# Patient Record
Sex: Female | Born: 1945
Health system: Southern US, Community
[De-identification: ages and names within clinical notes are randomized; demographics above are authoritative.]

## PROBLEM LIST (undated history)

## (undated) DIAGNOSIS — B009 Herpesviral infection, unspecified: Secondary | ICD-10-CM

## (undated) DIAGNOSIS — K219 Gastro-esophageal reflux disease without esophagitis: Secondary | ICD-10-CM

## (undated) DIAGNOSIS — E7800 Pure hypercholesterolemia, unspecified: Secondary | ICD-10-CM

## (undated) DIAGNOSIS — Z8669 Personal history of other diseases of the nervous system and sense organs: Secondary | ICD-10-CM

## (undated) DIAGNOSIS — C50919 Malignant neoplasm of unspecified site of unspecified female breast: Secondary | ICD-10-CM

## (undated) DIAGNOSIS — M419 Scoliosis, unspecified: Secondary | ICD-10-CM

## (undated) DIAGNOSIS — H9319 Tinnitus, unspecified ear: Secondary | ICD-10-CM

## (undated) DIAGNOSIS — R768 Other specified abnormal immunological findings in serum: Secondary | ICD-10-CM

## (undated) DIAGNOSIS — Z1509 Genetic susceptibility to other malignant neoplasm: Secondary | ICD-10-CM

## (undated) DIAGNOSIS — Z1501 Genetic susceptibility to malignant neoplasm of breast: Secondary | ICD-10-CM

## (undated) DIAGNOSIS — S93409A Sprain of unspecified ligament of unspecified ankle, initial encounter: Secondary | ICD-10-CM

## (undated) DIAGNOSIS — R7689 Other specified abnormal immunological findings in serum: Secondary | ICD-10-CM

## (undated) HISTORY — DX: Herpesviral infection, unspecified: B00.9

## (undated) HISTORY — DX: Scoliosis, unspecified: M41.9

## (undated) HISTORY — DX: Genetic susceptibility to malignant neoplasm of breast: Z15.01

## (undated) HISTORY — DX: Pure hypercholesterolemia, unspecified: E78.00

## (undated) HISTORY — DX: Genetic susceptibility to malignant neoplasm of breast: Z15.09

## (undated) HISTORY — DX: Malignant neoplasm of unspecified site of unspecified female breast: C50.919

## (undated) HISTORY — DX: Personal history of other diseases of the nervous system and sense organs: Z86.69

## (undated) HISTORY — DX: Tinnitus, unspecified ear: H93.19

## (undated) HISTORY — DX: Other specified abnormal immunological findings in serum: R76.8

## (undated) HISTORY — DX: Gastro-esophageal reflux disease without esophagitis: K21.9

## (undated) HISTORY — PX: BREAST LUMPECTOMY: SHX2

## (undated) HISTORY — DX: Other specified abnormal immunological findings in serum: R76.89

---
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
## 1998-12-21 ENCOUNTER — Encounter: Payer: Self-pay | Admitting: Obstetrics and Gynecology

## 1998-12-21 ENCOUNTER — Ambulatory Visit (HOSPITAL_COMMUNITY): Admission: RE | Admit: 1998-12-21 | Discharge: 1998-12-21 | Payer: Self-pay | Admitting: *Deleted

## 1998-12-26 ENCOUNTER — Ambulatory Visit (HOSPITAL_COMMUNITY): Admission: RE | Admit: 1998-12-26 | Discharge: 1998-12-26 | Payer: Self-pay | Admitting: *Deleted

## 1998-12-26 ENCOUNTER — Encounter: Payer: Self-pay | Admitting: Obstetrics and Gynecology

## 1998-12-31 ENCOUNTER — Encounter: Admission: RE | Admit: 1998-12-31 | Discharge: 1999-03-31 | Payer: Self-pay | Admitting: Radiation Oncology

## 1999-01-01 ENCOUNTER — Ambulatory Visit (HOSPITAL_COMMUNITY): Admission: RE | Admit: 1999-01-01 | Discharge: 1999-01-01 | Payer: Self-pay

## 1999-01-01 ENCOUNTER — Encounter: Payer: Self-pay | Admitting: General Surgery

## 1999-01-04 ENCOUNTER — Encounter: Payer: Self-pay | Admitting: General Surgery

## 1999-01-09 ENCOUNTER — Encounter: Payer: Self-pay | Admitting: General Surgery

## 1999-01-09 ENCOUNTER — Ambulatory Visit (HOSPITAL_COMMUNITY): Admission: RE | Admit: 1999-01-09 | Discharge: 1999-01-09 | Payer: Self-pay | Admitting: General Surgery

## 1999-01-30 ENCOUNTER — Other Ambulatory Visit: Admission: RE | Admit: 1999-01-30 | Discharge: 1999-01-30 | Payer: Self-pay | Admitting: Obstetrics and Gynecology

## 1999-02-08 ENCOUNTER — Ambulatory Visit (HOSPITAL_COMMUNITY): Admission: RE | Admit: 1999-02-08 | Discharge: 1999-02-08 | Payer: Self-pay | Admitting: Oncology

## 1999-02-08 ENCOUNTER — Encounter: Payer: Self-pay | Admitting: Oncology

## 1999-02-14 ENCOUNTER — Ambulatory Visit (HOSPITAL_COMMUNITY): Admission: RE | Admit: 1999-02-14 | Discharge: 1999-02-14 | Payer: Self-pay | Admitting: Oncology

## 1999-05-14 ENCOUNTER — Encounter: Admission: RE | Admit: 1999-05-14 | Discharge: 1999-08-12 | Payer: Self-pay | Admitting: Radiation Oncology

## 1999-06-26 ENCOUNTER — Ambulatory Visit (HOSPITAL_COMMUNITY): Admission: RE | Admit: 1999-06-26 | Discharge: 1999-06-26 | Payer: Self-pay | Admitting: Hematology & Oncology

## 1999-06-26 ENCOUNTER — Encounter: Payer: Self-pay | Admitting: Hematology & Oncology

## 1999-10-10 ENCOUNTER — Ambulatory Visit (HOSPITAL_COMMUNITY): Admission: RE | Admit: 1999-10-10 | Discharge: 1999-10-10 | Payer: Self-pay | Admitting: Oncology

## 1999-10-10 ENCOUNTER — Encounter: Payer: Self-pay | Admitting: Oncology

## 2000-02-13 ENCOUNTER — Ambulatory Visit (HOSPITAL_COMMUNITY): Admission: RE | Admit: 2000-02-13 | Discharge: 2000-02-13 | Payer: Self-pay | Admitting: General Surgery

## 2000-02-13 ENCOUNTER — Encounter: Payer: Self-pay | Admitting: General Surgery

## 2000-05-14 ENCOUNTER — Encounter: Admission: RE | Admit: 2000-05-14 | Discharge: 2000-05-14 | Payer: Self-pay | Admitting: Oncology

## 2000-05-14 ENCOUNTER — Encounter: Payer: Self-pay | Admitting: Oncology

## 2000-05-19 ENCOUNTER — Other Ambulatory Visit: Admission: RE | Admit: 2000-05-19 | Discharge: 2000-05-19 | Payer: Self-pay | Admitting: *Deleted

## 2000-09-21 ENCOUNTER — Ambulatory Visit (HOSPITAL_COMMUNITY): Admission: RE | Admit: 2000-09-21 | Discharge: 2000-09-21 | Payer: Self-pay | Admitting: Gastroenterology

## 2000-11-02 ENCOUNTER — Encounter: Payer: Self-pay | Admitting: General Surgery

## 2000-11-02 ENCOUNTER — Encounter: Admission: RE | Admit: 2000-11-02 | Discharge: 2000-11-02 | Payer: Self-pay | Admitting: General Surgery

## 2001-06-17 ENCOUNTER — Other Ambulatory Visit: Admission: RE | Admit: 2001-06-17 | Discharge: 2001-06-17 | Payer: Self-pay | Admitting: *Deleted

## 2001-11-03 ENCOUNTER — Encounter: Admission: RE | Admit: 2001-11-03 | Discharge: 2001-11-03 | Payer: Self-pay | Admitting: Oncology

## 2001-11-03 ENCOUNTER — Encounter: Payer: Self-pay | Admitting: Oncology

## 2002-07-08 ENCOUNTER — Other Ambulatory Visit: Admission: RE | Admit: 2002-07-08 | Discharge: 2002-07-08 | Payer: Self-pay | Admitting: Obstetrics and Gynecology

## 2002-11-07 ENCOUNTER — Encounter: Payer: Self-pay | Admitting: Oncology

## 2002-11-07 ENCOUNTER — Encounter: Admission: RE | Admit: 2002-11-07 | Discharge: 2002-11-07 | Payer: Self-pay | Admitting: Oncology

## 2003-07-13 ENCOUNTER — Other Ambulatory Visit: Admission: RE | Admit: 2003-07-13 | Discharge: 2003-07-13 | Payer: Self-pay | Admitting: Obstetrics and Gynecology

## 2003-11-21 ENCOUNTER — Encounter: Admission: RE | Admit: 2003-11-21 | Discharge: 2003-11-21 | Payer: Self-pay | Admitting: Internal Medicine

## 2004-08-27 ENCOUNTER — Other Ambulatory Visit: Admission: RE | Admit: 2004-08-27 | Discharge: 2004-08-27 | Payer: Self-pay | Admitting: *Deleted

## 2004-11-24 HISTORY — PX: MASTECTOMY: SHX3

## 2004-11-24 HISTORY — PX: LAPAROSCOPIC BILATERAL SALPINGO OOPHERECTOMY: SHX5890

## 2005-03-04 ENCOUNTER — Ambulatory Visit: Payer: Self-pay | Admitting: Oncology

## 2005-03-18 ENCOUNTER — Encounter: Admission: RE | Admit: 2005-03-18 | Discharge: 2005-03-18 | Payer: Self-pay | Admitting: Oncology

## 2005-06-25 ENCOUNTER — Ambulatory Visit (HOSPITAL_COMMUNITY): Admission: RE | Admit: 2005-06-25 | Discharge: 2005-06-25 | Payer: Self-pay | Admitting: Gastroenterology

## 2005-08-01 IMAGING — CR DG CHEST 2V
2 series · 2 of 2 positions shown · non-contrast
Comparison: none

CLINICAL DATA: Preoperative respiratory exam for breast disease. 
 CHEST - 2 VIEW: 
 Heart size is normal.   The mediastinum is unremarkable.   The lungs are clear.   There are surgical clips in the region of the left breast and left axilla.   No effusions.  There is scoliosis of the spine.

[view not recorded (1 of 2)]
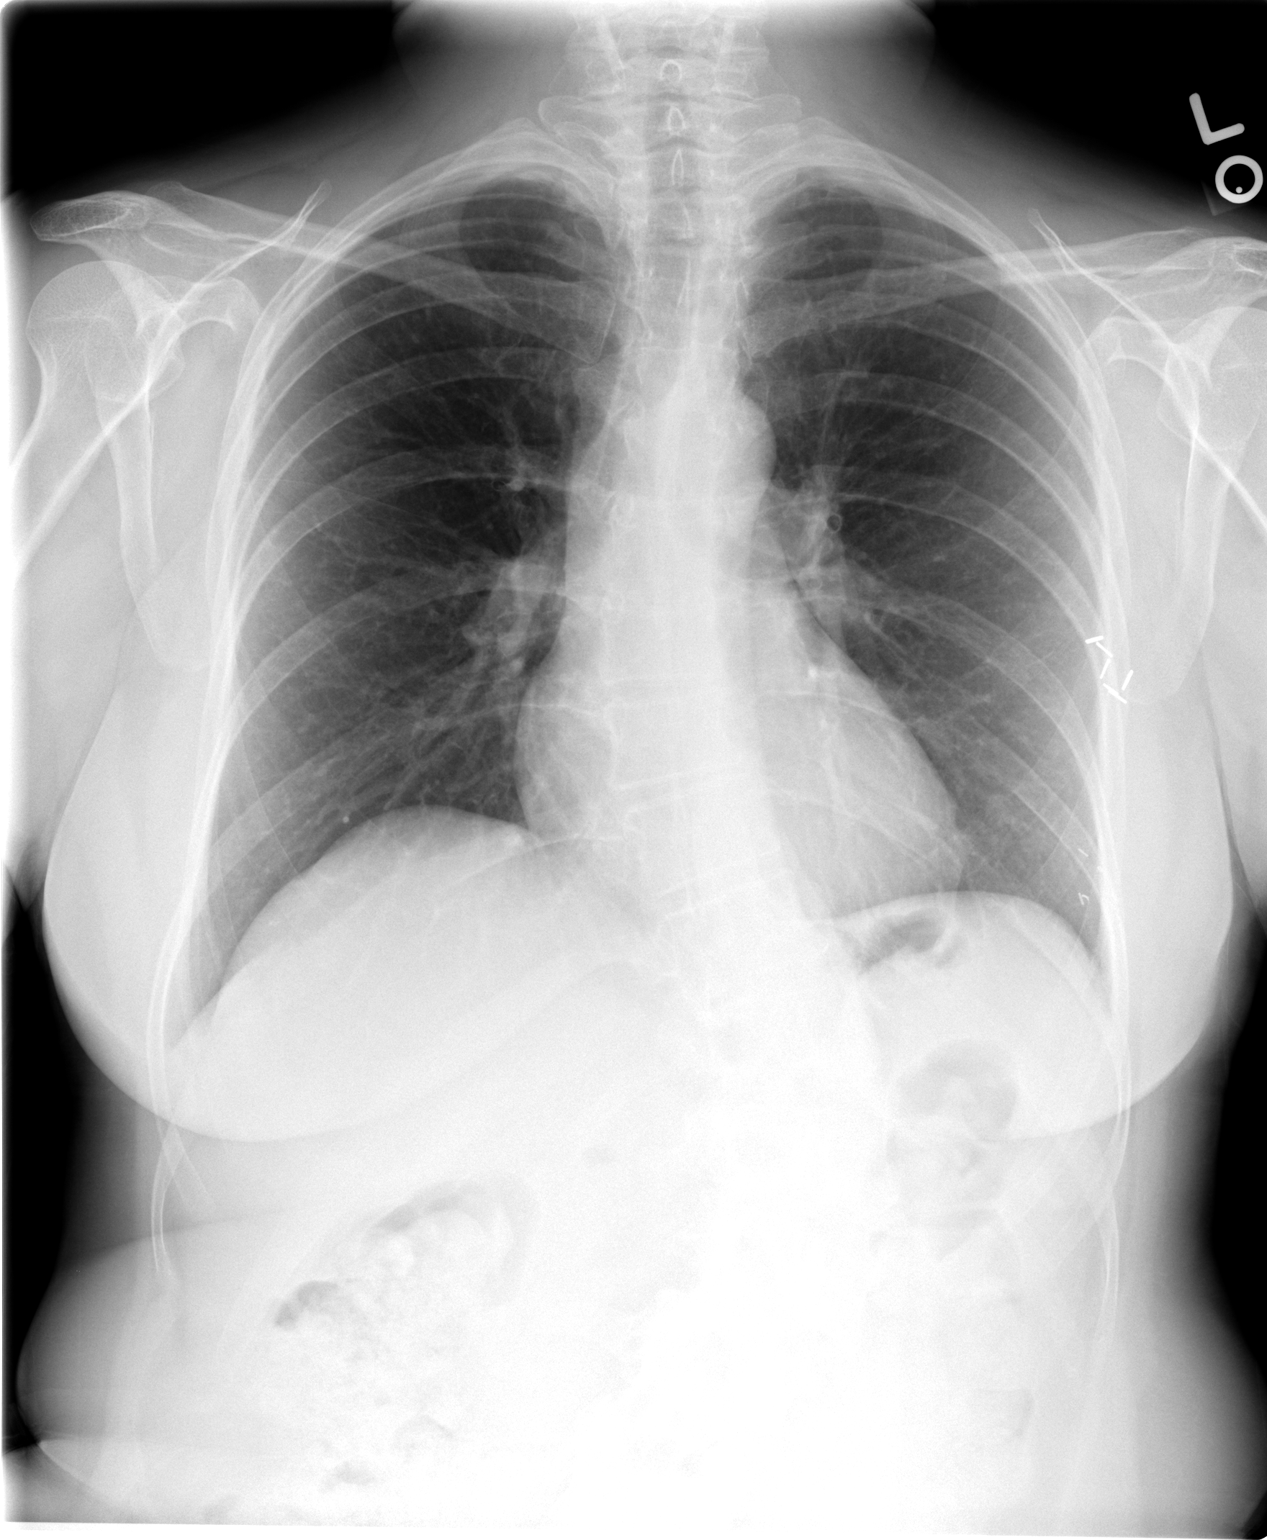

[view not recorded (2 of 2)]
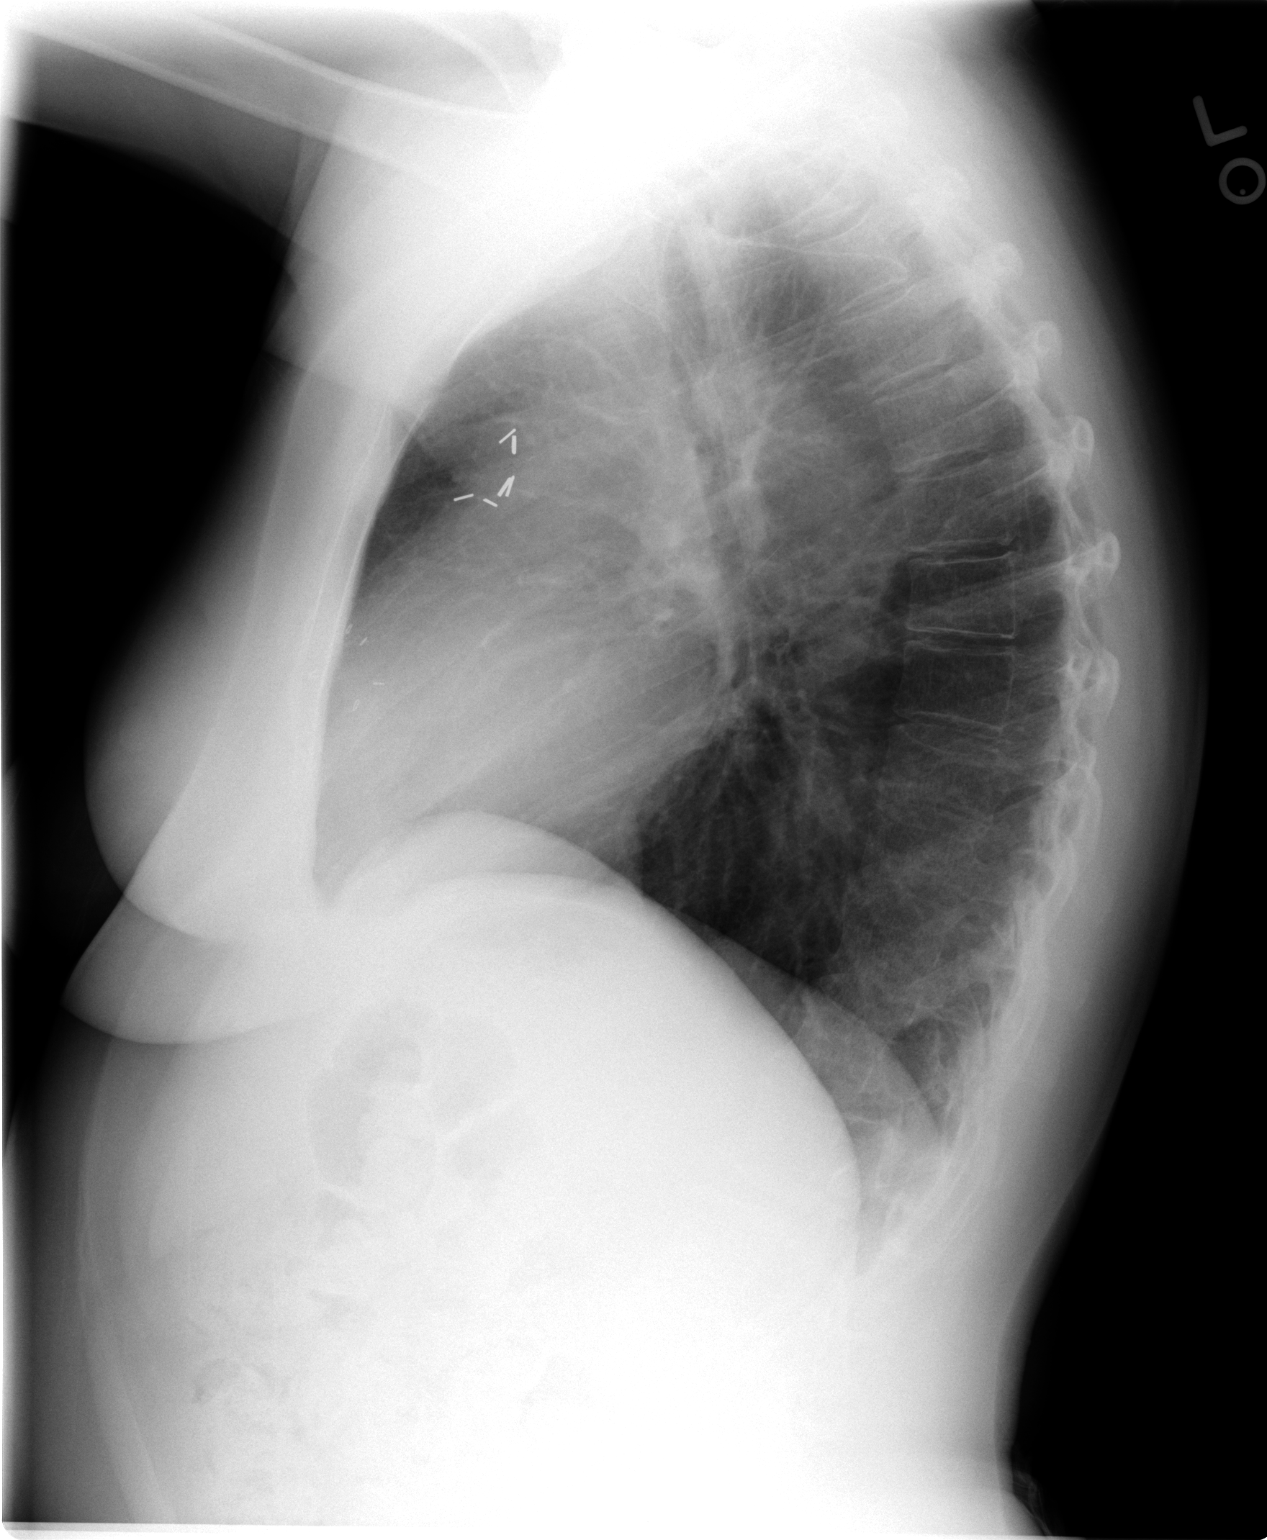

[2 of 2 positions shown; findings below may reference images not displayed]

IMPRESSION: No active disease.

## 2005-08-11 ENCOUNTER — Observation Stay (HOSPITAL_COMMUNITY): Admission: RE | Admit: 2005-08-11 | Discharge: 2005-08-12 | Payer: Self-pay | Admitting: Obstetrics and Gynecology

## 2005-08-11 ENCOUNTER — Encounter (INDEPENDENT_AMBULATORY_CARE_PROVIDER_SITE_OTHER): Payer: Self-pay | Admitting: Specialist

## 2006-03-05 ENCOUNTER — Ambulatory Visit: Payer: Self-pay | Admitting: Oncology

## 2006-03-06 LAB — COMPREHENSIVE METABOLIC PANEL
AST: 19 U/L (ref 0–37)
Alkaline Phosphatase: 79 U/L (ref 39–117)
BUN: 22 mg/dL (ref 6–23)
Calcium: 9.7 mg/dL (ref 8.4–10.5)
Chloride: 100 mEq/L (ref 96–112)
Creatinine, Ser: 0.9 mg/dL (ref 0.4–1.2)
Total Bilirubin: 0.5 mg/dL (ref 0.3–1.2)

## 2006-03-06 LAB — CBC WITH DIFFERENTIAL/PLATELET
Basophils Absolute: 0 10*3/uL (ref 0.0–0.1)
EOS%: 1 % (ref 0.0–7.0)
HCT: 38.1 % (ref 34.8–46.6)
HGB: 12.8 g/dL (ref 11.6–15.9)
LYMPH%: 23.9 % (ref 14.0–48.0)
MCH: 29.5 pg (ref 26.0–34.0)
MCV: 87.6 fL (ref 81.0–101.0)
MONO%: 7.8 % (ref 0.0–13.0)
NEUT%: 66.8 % (ref 39.6–76.8)

## 2006-03-24 ENCOUNTER — Encounter: Admission: RE | Admit: 2006-03-24 | Discharge: 2006-03-24 | Payer: Self-pay | Admitting: Obstetrics and Gynecology

## 2006-04-22 ENCOUNTER — Other Ambulatory Visit: Admission: RE | Admit: 2006-04-22 | Discharge: 2006-04-22 | Payer: Self-pay | Admitting: Obstetrics and Gynecology

## 2007-02-26 ENCOUNTER — Ambulatory Visit: Payer: Self-pay | Admitting: Oncology

## 2007-03-03 ENCOUNTER — Ambulatory Visit (HOSPITAL_COMMUNITY): Admission: RE | Admit: 2007-03-03 | Discharge: 2007-03-03 | Payer: Self-pay | Admitting: Oncology

## 2007-03-03 LAB — CA 125: CA 125: 9.6 U/mL (ref 0.0–30.2)

## 2007-03-03 LAB — CBC WITH DIFFERENTIAL/PLATELET
Basophils Absolute: 0.1 10*3/uL (ref 0.0–0.1)
Eosinophils Absolute: 0 10*3/uL (ref 0.0–0.5)
HGB: 12.5 g/dL (ref 11.6–15.9)
MCV: 86.5 fL (ref 81.0–101.0)
MONO%: 7.2 % (ref 0.0–13.0)
NEUT#: 3.4 10*3/uL (ref 1.5–6.5)
RDW: 11.9 % (ref 11.3–14.5)

## 2007-03-03 LAB — COMPREHENSIVE METABOLIC PANEL
Albumin: 4.7 g/dL (ref 3.5–5.2)
Alkaline Phosphatase: 77 U/L (ref 39–117)
BUN: 21 mg/dL (ref 6–23)
Calcium: 10 mg/dL (ref 8.4–10.5)
Chloride: 102 mEq/L (ref 96–112)
Glucose, Bld: 107 mg/dL — ABNORMAL HIGH (ref 70–99)
Potassium: 4.7 mEq/L (ref 3.5–5.3)

## 2007-03-03 LAB — CANCER ANTIGEN 27.29: CA 27.29: 12 U/mL (ref 0–39)

## 2007-03-03 IMAGING — CR DG CHEST 2V
2 series · 2 of 2 positions shown · non-contrast
Comparison: [DATE]
 Dextroscoliosis thoracic spine and marked levoscoliosis upper lumbar spine.

CLINICAL DATA: Breast cancer.  Nonsmoker.  
 [UJ] VIEWS:

[view not recorded (1 of 2)]
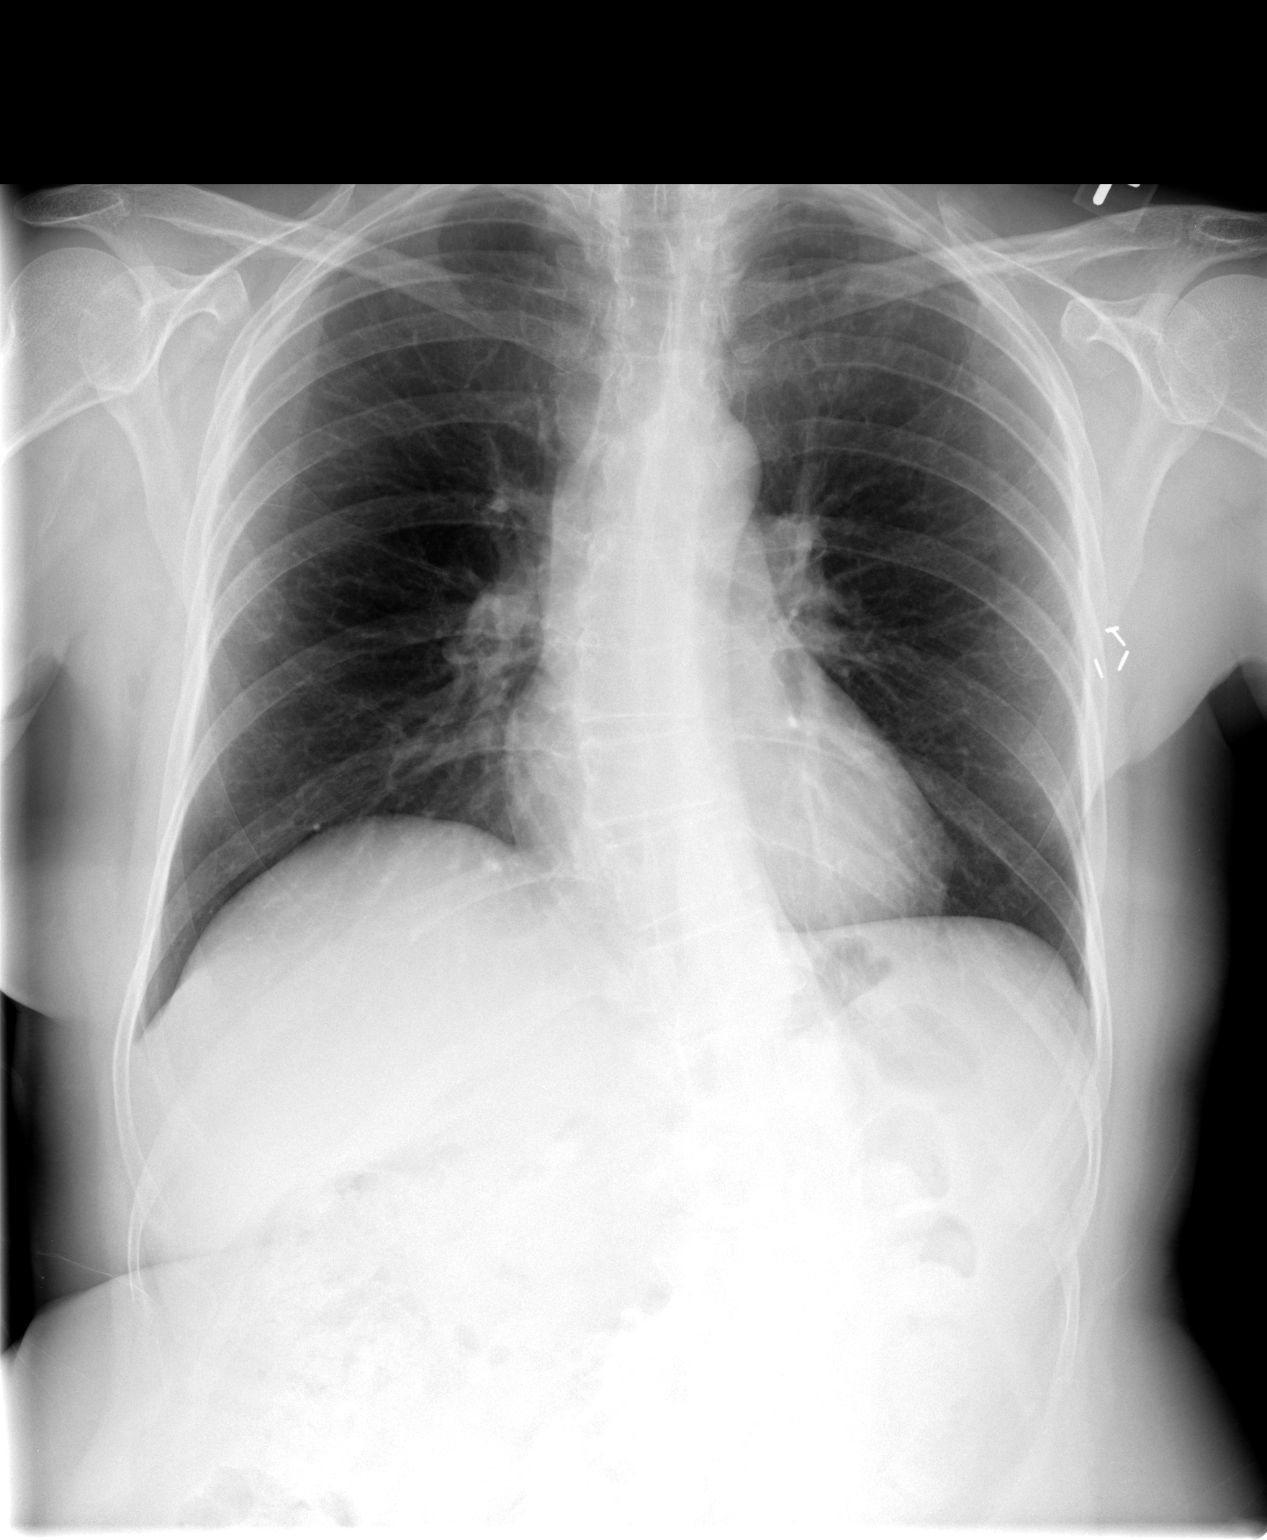

[view not recorded (2 of 2)]
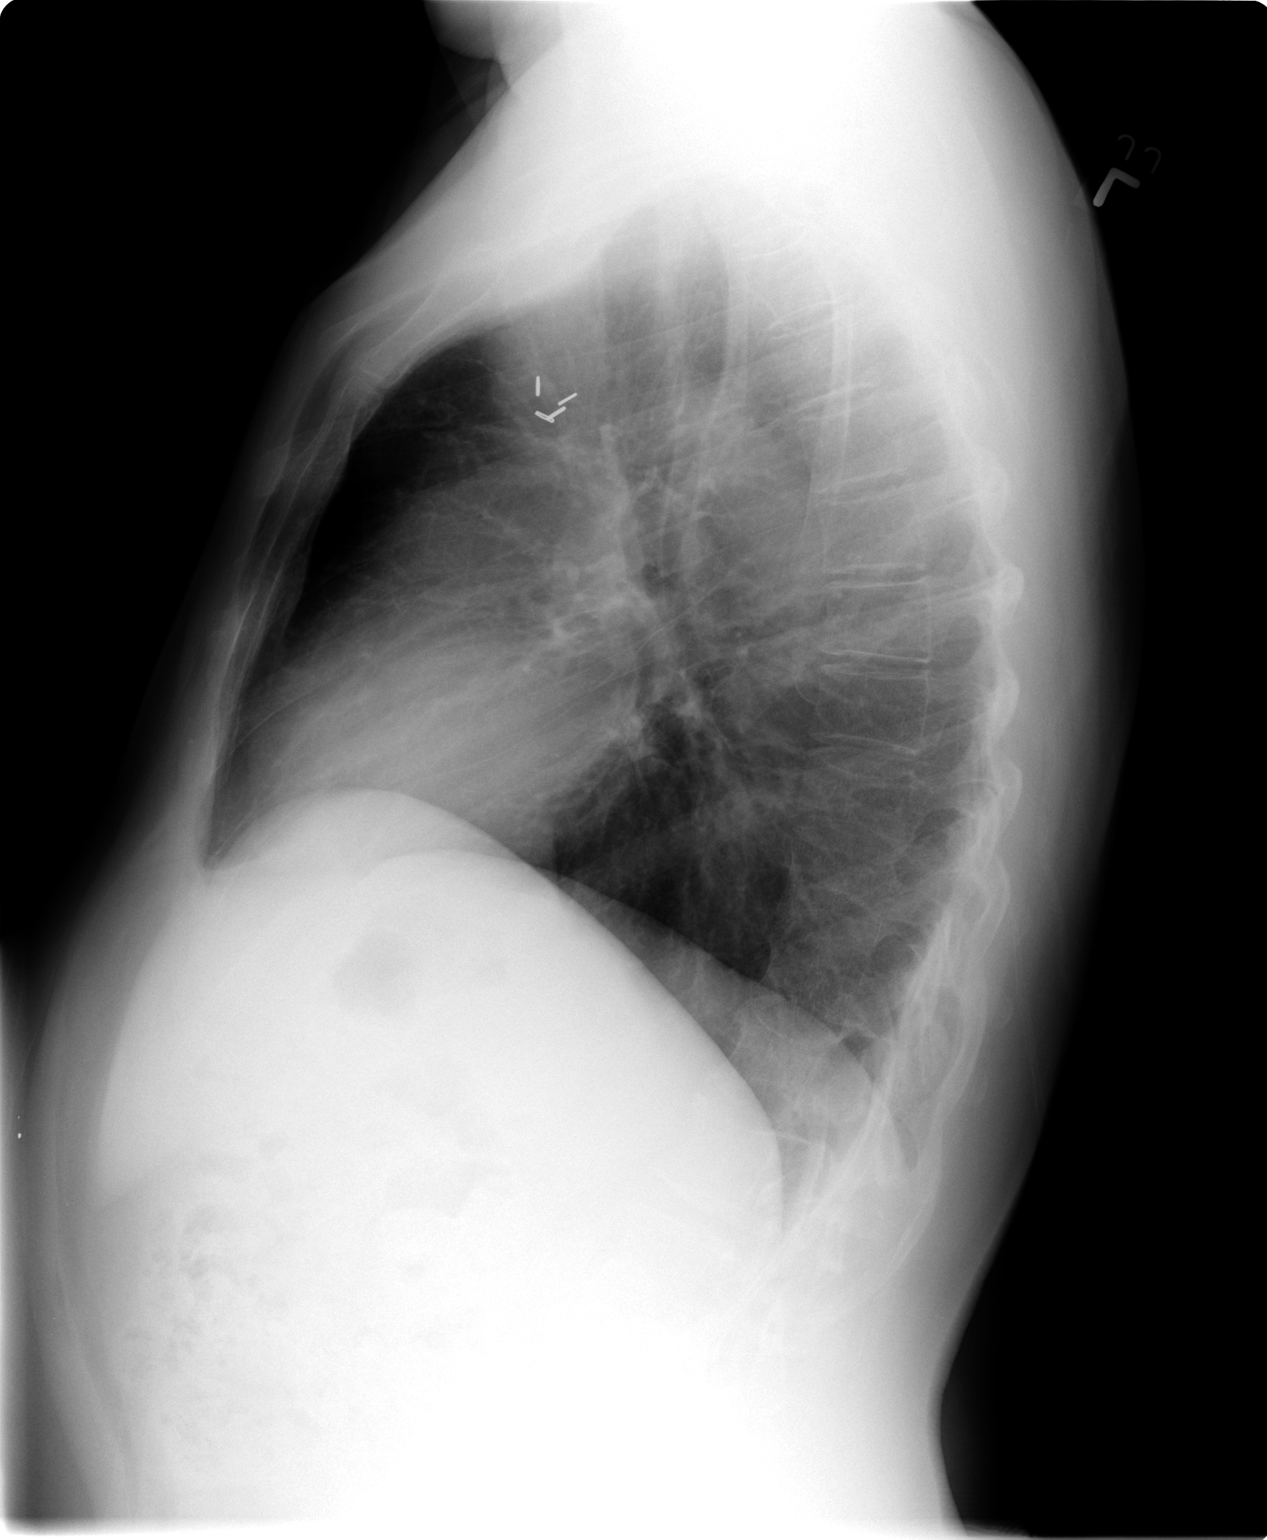

[2 of 2 positions shown; findings below may reference images not displayed]

Mediastinum and cardiac silhouette stable.  Biapical pleural thickening unchanged.  No infiltrate, congestive heart failure, pneumothorax, or plain film findings of pulmonary metastatic disease.
IMPRESSION: No acute abnormality.

## 2007-07-21 ENCOUNTER — Ambulatory Visit: Payer: Self-pay | Admitting: Cardiology

## 2007-07-21 ENCOUNTER — Other Ambulatory Visit: Admission: RE | Admit: 2007-07-21 | Discharge: 2007-07-21 | Payer: Self-pay | Admitting: Obstetrics and Gynecology

## 2007-10-13 ENCOUNTER — Ambulatory Visit: Payer: Self-pay | Admitting: Internal Medicine

## 2007-10-13 DIAGNOSIS — M899 Disorder of bone, unspecified: Secondary | ICD-10-CM | POA: Insufficient documentation

## 2007-10-13 DIAGNOSIS — E785 Hyperlipidemia, unspecified: Secondary | ICD-10-CM | POA: Insufficient documentation

## 2007-10-13 DIAGNOSIS — E782 Mixed hyperlipidemia: Secondary | ICD-10-CM | POA: Insufficient documentation

## 2007-10-13 DIAGNOSIS — M129 Arthropathy, unspecified: Secondary | ICD-10-CM | POA: Insufficient documentation

## 2007-10-13 DIAGNOSIS — M545 Low back pain, unspecified: Secondary | ICD-10-CM | POA: Insufficient documentation

## 2007-10-13 DIAGNOSIS — Z853 Personal history of malignant neoplasm of breast: Secondary | ICD-10-CM | POA: Insufficient documentation

## 2007-10-13 DIAGNOSIS — R51 Headache: Secondary | ICD-10-CM | POA: Insufficient documentation

## 2007-10-13 DIAGNOSIS — M949 Disorder of cartilage, unspecified: Secondary | ICD-10-CM

## 2007-10-13 DIAGNOSIS — M4120 Other idiopathic scoliosis, site unspecified: Secondary | ICD-10-CM | POA: Insufficient documentation

## 2007-10-13 DIAGNOSIS — R030 Elevated blood-pressure reading, without diagnosis of hypertension: Secondary | ICD-10-CM | POA: Insufficient documentation

## 2007-10-13 DIAGNOSIS — R519 Headache, unspecified: Secondary | ICD-10-CM | POA: Insufficient documentation

## 2007-10-13 DIAGNOSIS — K219 Gastro-esophageal reflux disease without esophagitis: Secondary | ICD-10-CM | POA: Insufficient documentation

## 2007-10-20 ENCOUNTER — Ambulatory Visit: Payer: Self-pay | Admitting: Internal Medicine

## 2007-10-20 LAB — CONVERTED CEMR LAB: Vit D, 1,25-Dihydroxy: 49 (ref 30–89)

## 2007-10-24 LAB — CONVERTED CEMR LAB
CRP, High Sensitivity: 3 (ref 0.00–5.00)
Cholesterol: 225 mg/dL (ref 0–200)
HDL: 49.6 mg/dL (ref >39.0)
VLDL: 25 mg/dL (ref 0–40)

## 2007-10-29 ENCOUNTER — Ambulatory Visit: Payer: Self-pay | Admitting: Internal Medicine

## 2007-10-29 LAB — CONVERTED CEMR LAB
Cholesterol, target level: 200 mg/dL
HDL goal, serum: 40 mg/dL
LDL Goal: 160 mg/dL

## 2007-11-01 ENCOUNTER — Encounter: Payer: Self-pay | Admitting: Internal Medicine

## 2007-11-01 ENCOUNTER — Telehealth: Payer: Self-pay | Admitting: Internal Medicine

## 2007-11-02 ENCOUNTER — Telehealth: Payer: Self-pay | Admitting: *Deleted

## 2007-11-26 ENCOUNTER — Telehealth: Payer: Self-pay | Admitting: Internal Medicine

## 2008-01-06 ENCOUNTER — Ambulatory Visit: Payer: Self-pay | Admitting: Internal Medicine

## 2008-01-06 LAB — CONVERTED CEMR LAB
ALT: 26 units/L (ref 0–35)
Cholesterol: 176 mg/dL (ref 0–200)
LDL Cholesterol: 105 mg/dL — ABNORMAL HIGH (ref 0–99)

## 2008-01-21 ENCOUNTER — Telehealth: Payer: Self-pay | Admitting: Internal Medicine

## 2008-02-08 ENCOUNTER — Encounter: Payer: Self-pay | Admitting: Internal Medicine

## 2008-02-28 ENCOUNTER — Ambulatory Visit: Payer: Self-pay | Admitting: Oncology

## 2008-03-06 ENCOUNTER — Telehealth: Payer: Self-pay | Admitting: *Deleted

## 2008-05-16 ENCOUNTER — Emergency Department (HOSPITAL_COMMUNITY): Admission: EM | Admit: 2008-05-16 | Discharge: 2008-05-17 | Payer: Self-pay | Admitting: Emergency Medicine
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
## 2008-06-12 ENCOUNTER — Ambulatory Visit: Payer: Self-pay | Admitting: Internal Medicine

## 2008-06-12 DIAGNOSIS — I872 Venous insufficiency (chronic) (peripheral): Secondary | ICD-10-CM | POA: Insufficient documentation

## 2008-06-20 ENCOUNTER — Telehealth: Payer: Self-pay | Admitting: *Deleted

## 2008-06-22 LAB — CONVERTED CEMR LAB
Bilirubin, Direct: 0.1 mg/dL (ref 0.0–0.3)
Cholesterol: 170 mg/dL (ref 0–200)
HDL: 61.8 mg/dL (ref >39.0)
LDL Cholesterol: 90 mg/dL (ref 0–99)
Total Bilirubin: 0.7 mg/dL (ref 0.3–1.2)
Total Protein: 7.1 g/dL (ref 6.0–8.3)
Triglycerides: 93 mg/dL (ref 0–149)
VLDL: 19 mg/dL (ref 0–40)

## 2008-06-30 ENCOUNTER — Telehealth: Payer: Self-pay | Admitting: Internal Medicine

## 2008-11-01 ENCOUNTER — Ambulatory Visit: Payer: Self-pay | Admitting: Oncology

## 2008-11-06 LAB — CBC WITH DIFFERENTIAL/PLATELET
HCT: 36.7 % (ref 34.8–46.6)
MCH: 29.9 pg (ref 26.0–34.0)
MCHC: 34 g/dL (ref 32.0–36.0)
MCV: 87.9 fL (ref 81.0–101.0)
MONO%: 7.1 % (ref 0.0–13.0)
NEUT#: 3.2 10*3/uL (ref 1.5–6.5)
Platelets: 249 10*3/uL (ref 145–400)
RBC: 4.17 10*6/uL (ref 3.70–5.32)
lymph#: 1.1 10*3/uL (ref 0.9–3.3)

## 2008-11-06 LAB — COMPREHENSIVE METABOLIC PANEL
AST: 20 U/L (ref 0–37)
Albumin: 4.5 g/dL (ref 3.5–5.2)
CO2: 28 mEq/L (ref 19–32)
Calcium: 9.5 mg/dL (ref 8.4–10.5)
Total Bilirubin: 0.4 mg/dL (ref 0.3–1.2)

## 2008-11-06 LAB — CA 125: CA 125: 7.9 U/mL (ref 0.0–30.2)

## 2008-11-06 LAB — CANCER ANTIGEN 27.29: CA 27.29: 12 U/mL (ref 0–39)

## 2008-11-07 ENCOUNTER — Encounter: Payer: Self-pay | Admitting: Internal Medicine

## 2008-11-10 ENCOUNTER — Ambulatory Visit: Payer: Self-pay | Admitting: Internal Medicine

## 2008-11-29 ENCOUNTER — Encounter: Admission: RE | Admit: 2008-11-29 | Discharge: 2008-11-29 | Payer: Self-pay | Admitting: Obstetrics and Gynecology

## 2009-02-12 ENCOUNTER — Other Ambulatory Visit: Admission: RE | Admit: 2009-02-12 | Discharge: 2009-02-12 | Payer: Self-pay | Admitting: Obstetrics and Gynecology

## 2009-05-17 ENCOUNTER — Ambulatory Visit: Payer: Self-pay | Admitting: Internal Medicine

## 2009-05-17 DIAGNOSIS — M19049 Primary osteoarthritis, unspecified hand: Secondary | ICD-10-CM | POA: Insufficient documentation

## 2009-06-07 ENCOUNTER — Encounter: Payer: Self-pay | Admitting: Internal Medicine

## 2009-06-14 ENCOUNTER — Encounter (INDEPENDENT_AMBULATORY_CARE_PROVIDER_SITE_OTHER): Payer: Self-pay | Admitting: *Deleted

## 2009-07-03 ENCOUNTER — Encounter: Payer: Self-pay | Admitting: Internal Medicine

## 2009-08-14 ENCOUNTER — Ambulatory Visit: Payer: Self-pay | Admitting: Internal Medicine

## 2009-08-14 DIAGNOSIS — B009 Herpesviral infection, unspecified: Secondary | ICD-10-CM | POA: Insufficient documentation

## 2009-08-17 LAB — CONVERTED CEMR LAB
Alkaline Phosphatase: 55 units/L (ref 39–117)
BUN: 17 mg/dL (ref 6–23)
Basophils Absolute: 0 10*3/uL (ref 0.0–0.1)
Basophils Relative: 0.7 % (ref 0.0–3.0)
Bilirubin, Direct: 0 mg/dL (ref 0.0–0.3)
CO2: 33 meq/L — ABNORMAL HIGH (ref 19–32)
Calcium: 9.5 mg/dL (ref 8.4–10.5)
Chloride: 103 meq/L (ref 96–112)
Creatinine, Ser: 0.9 mg/dL (ref 0.4–1.2)
Eosinophils Absolute: 0.1 10*3/uL (ref 0.0–0.7)
GFR calc non Af Amer: 67.4 mL/min (ref >60)
Glucose, Bld: 93 mg/dL (ref 70–99)
HCT: 36.7 % (ref 36.0–46.0)
Hemoglobin: 12.5 g/dL (ref 12.0–15.0)
Monocytes Relative: 6 % (ref 3.0–12.0)
Neutrophils Relative %: 66.5 % (ref 43.0–77.0)
Platelets: 257 10*3/uL (ref 150.0–400.0)
Potassium: 4.8 meq/L (ref 3.5–5.1)
RDW: 13.3 % (ref 11.5–14.6)
Total Bilirubin: 0.7 mg/dL (ref 0.3–1.2)
Total Protein: 7.7 g/dL (ref 6.0–8.3)
Triglycerides: 185 mg/dL — ABNORMAL HIGH (ref 0.0–149.0)
VLDL: 37 mg/dL (ref 0.0–40.0)

## 2009-12-31 ENCOUNTER — Telehealth: Payer: Self-pay | Admitting: Internal Medicine

## 2009-12-31 ENCOUNTER — Ambulatory Visit: Payer: Self-pay | Admitting: Internal Medicine

## 2010-02-20 ENCOUNTER — Encounter: Payer: Self-pay | Admitting: Internal Medicine

## 2010-04-17 ENCOUNTER — Ambulatory Visit: Payer: Self-pay | Admitting: Internal Medicine

## 2010-07-18 ENCOUNTER — Ambulatory Visit: Payer: Self-pay | Admitting: Internal Medicine

## 2010-07-18 LAB — CONVERTED CEMR LAB
Bilirubin Urine: NEGATIVE
Glucose, Urine, Semiquant: NEGATIVE
Ketones, urine, test strip: NEGATIVE
Nitrite: NEGATIVE
Protein, U semiquant: NEGATIVE
Specific Gravity, Urine: 1.01
Urobilinogen, UA: 0.2

## 2010-07-22 LAB — CONVERTED CEMR LAB
Albumin: 4.6 g/dL (ref 3.5–5.2)
Alkaline Phosphatase: 57 units/L (ref 39–117)
BUN: 16 mg/dL (ref 6–23)
Bilirubin, Direct: 0.1 mg/dL (ref 0.0–0.3)
Calcium: 9.5 mg/dL (ref 8.4–10.5)
Chloride: 100 meq/L (ref 96–112)
Cholesterol: 227 mg/dL — ABNORMAL HIGH (ref 0–200)
Creatinine, Ser: 0.8 mg/dL (ref 0.4–1.2)
GFR calc non Af Amer: 80.46 mL/min (ref >60)
Glucose, Bld: 97 mg/dL (ref 70–99)
Hemoglobin: 12.7 g/dL (ref 12.0–15.0)
Lymphocytes Relative: 22.6 % (ref 12.0–46.0)
Lymphs Abs: 1.2 10*3/uL (ref 0.7–4.0)
Monocytes Relative: 6.8 % (ref 3.0–12.0)
Neutro Abs: 3.7 10*3/uL (ref 1.4–7.7)
Platelets: 243 10*3/uL (ref 150.0–400.0)
Total Bilirubin: 0.8 mg/dL (ref 0.3–1.2)
Total Protein: 7.1 g/dL (ref 6.0–8.3)
WBC: 5.3 10*3/uL (ref 4.5–10.5)

## 2010-09-16 ENCOUNTER — Telehealth: Payer: Self-pay | Admitting: *Deleted

## 2010-12-18 ENCOUNTER — Telehealth: Payer: Self-pay | Admitting: Internal Medicine

## 2010-12-26 NOTE — Assessment & Plan Note (Signed)
Summary: sinus infection/dm   Vital Signs:  Patient profile:   65 year old female Menstrual status:  postmenopausal Weight:      135 pounds Temp:     98.3 degrees F oral Pulse rate:   72 / minute BP sitting:   120 / 80  (left arm) Cuff size:   regular  Vitals Entered By: [name], CMA (AAMA) (December 31, 2009 11:09 AM) CC: Started on Amoxil (unsure if it had expired or not) was flying from [place]. Pt started feeling bad on 1/25 with sinus pressure, ha's and sneezing. Pt took the antibotic for 1 week. Pt started the amxoil x 1 week then but didn't seem to help. Pt still has a ongoing cough and ha. Some yellow nasal congestion, nausea and sinus pressure.    History of Present Illness: Nicole [name] comesin today for     acute SDA . Has had sinus pressure and uri  signs  however worse with recent  after flight .     with pressure  change  .   after exercise  next day with nausea and fatigue  .   And now cough.  Continuing HA  pressure behind eyes and everywhere . Thinks its a sinus problem  ,.  Hxof    Migraines .  but doesnt think this is.   Preventive Screening-Counseling & Management  Alcohol-Tobacco     Alcohol drinks/day: <1     Alcohol type: all     Smoking Status: quit     Year Quit: 25 years ago  Caffeine-Diet-Exercise     Caffeine use/day: <1     Does Patient Exercise: yes     Type of exercise: Walking, Yoga     Times/week: 5  Current Medications (verified): 1)  Valtrex 1 Gm Tabs (Valacyclovir Hcl) .Marland Kitchen.. 1 By Mouth Three Times A Day or As Directed 2)  Mevacor 20 Mg  Tabs (Lovastatin) .Marland Kitchen.. 1 By Mouth Once Daily 3)  Zantac 150 Maximum Strength 150 Mg  Tabs (Ranitidine Hcl) .Marland Kitchen.. 1 By Mouth Two Times A Day 4)  Calcium 600 1500 Mg Tabs (Calcium Carbonate) 5)  Vitamin D (Ergocalciferol) 50000 Unit Caps (Ergocalciferol) 6)  B Complex Vitamins  Caps (B Complex Vitamins) 7)  Magnesium 300 Mg Caps (Magnesium) 8)  Fish Oil   Oil (Fish Oil) 9)  Flax   Oil  (Flaxseed (Linseed)) 10)  Apple Pectin 11)  Ativan 0.5 Mg Tabs (Lorazepam) .Marland Kitchen.. 1-2 By Mouth As Needed Sleep  Allergies (verified): 1)  ! Sulfa  Past History:  Past medical, surgical, family and social histories (including risk factors) reviewed, and no changes noted (except as noted below).  Past Medical History: Reviewed history from 11/10/2008 and no changes required. Breast cancer, hx of BRACA 1 Headache Rheumatoid factor ? OA  GERD  [name] yearly visit.  no reg rheumatologist  [name]    Past Surgical History: Reviewed history from 10/13/2007 and no changes required. Lumpectomy Double Mastectomy Ovaries Removed  Past History:  Care Management: Gynecology: [name] Gastroenterology: [name] Durenda Age   Family History: Reviewed history from 05/17/2009 and no changes required. Family History of Cardiovascular disorder-Mom and Maternal Grandfather- Stroke, Heart Attack- Brother Family History of Arthritis- Maternal Side RA  cousin  Family History Kidney disease- Maternal Side Family History Diabetes 1st degree relative- Paternal Side Family History of Respiratory disease- CF- Sister Mom had stroke at 52       Social History: Reviewed history from 11/10/2008  and no changes required. Occupation: Retired school sysstem Married  Former Smoker Alcohol use-yes Drug use-no Regular exercise-yes 30/day 5 days a week   household of 2 husband   Review of Systems       The patient complains of headaches.  The patient denies anorexia, fever, weight loss, weight gain, vision loss, hoarseness, chest pain, syncope, dyspnea on exertion, peripheral edema, prolonged cough, hemoptysis, abdominal pain, transient blindness, difficulty walking, unusual weight change, abnormal bleeding, enlarged lymph nodes, and angioedema.    Physical Exam  General:  Well-developed,well-nourished,in no acute distress; alert,appropriate and cooperative  throughout examination very congested  Head:  Normocephalic and atraumatic without obvious abnormalities. No apparent alopecia or balding. Eyes:  vision grossly intact, pupils equal, and pupils round.   Ears:  R ear normal, L ear normal, and no external deformities.   Nose:  no external deformity and no external erythema.  very congested and tender right face   Mouth:  pharynx pink and moist, no erythema, and no exudates.   Neck:  No deformities, masses, or tenderness noted. Lungs:  Normal respiratory effort, chest expands symmetrically. Lungs are clear to auscultation, no crackles or wheezes. Heart:  no lifts.   Pulses:  nl cap refill  Extremities:  no clubbing cyanosis or edema  Neurologic:  non focal  Skin:  turgor normal, color normal, no ecchymoses, no petechiae, and no purpura.   Cervical Nodes:  No lymphadenopathy noted Psych:  Oriented X3, good eye contact, not anxious appearing, and not depressed appearing.     Impression & Recommendations:  Problem # 1:  SINUSITIS - ACUTE-NOS (ICD-461.9)  vs uri    migraine also possible   aggravated by baro pressure. Her updated medication list for this problem includes:    Cefdinir 300 Mg Caps (Cefdinir) .Marland Kitchen... 1 by mouth two times a day for conestion  Orders: Prescription Created Electronically [phone])  Problem # 2:  HEADACHE (ICD-784.0) multiple cause es.  Complete Medication List: 1)  Valtrex 1 Gm Tabs (Valacyclovir hcl) .Marland Kitchen.. 1 by mouth three times a day or as directed 2)  Mevacor 20 Mg Tabs (Lovastatin) .Marland Kitchen.. 1 by mouth once daily 3)  Zantac 150 Maximum Strength 150 Mg Tabs (Ranitidine hcl) .Marland Kitchen.. 1 by mouth two times a day 4)  Calcium 600 1500 Mg Tabs (Calcium carbonate) 5)  Vitamin D (ergocalciferol) 50000 Unit Caps (Ergocalciferol) 6)  B Complex Vitamins Caps (B complex vitamins) 7)  Magnesium 300 Mg Caps (Magnesium) 8)  Fish Oil Oil (Fish oil) 9)  Flax Oil (Flaxseed (linseed)) 10)  Apple Pectin  11)  Ativan 0.5 Mg Tabs  (Lorazepam) .Marland Kitchen.. 1-2 by mouth as needed sleep 12)  Cefdinir 300 Mg Caps (Cefdinir) .Marland Kitchen.. 1 by mouth two times a day for conestion  Patient Instructions: 1)  change     antibiotic.  2)  expect improvement with in 5 days.        3)  If not improving consider getting ct scan of sinuses.  Prescriptions: CEFDINIR 300 MG CAPS (CEFDINIR) 1 by mouth two times a day for conestion  #20 x 0   Entered and Authorized by:   Madelin Headings MD   Signed by:   Madelin Headings MD on 12/31/2009   Method used:   Electronically to        [id number] (retail)       [address]       [place], [place]  [address]  Ph: [phone] or [phone]       Fax: [phone]   RxID:   [id number]

## 2010-12-26 NOTE — Progress Notes (Signed)
Summary: sinus infection  Phone Note Call from Patient   Caller: Patient Call For: Madelin Headings MD Reason for Call: Acute Illness Summary of Call: Pt. calls complaining of sinus infection after traveling to [place]. Will call her back when IDX is back up. [phone] Initial call taken by: Lynann Beaver CMA,  December 31, 2009 9:26 AM  Follow-up for Phone Call        Appt scheduled with Dr. Fabian Sharp today. Follow-up by: Lynann Beaver CMA,  December 31, 2009 9:45 AM

## 2010-12-26 NOTE — Assessment & Plan Note (Signed)
Summary: med check/refill/cjr   Vital Signs:  Patient profile:   65 year old female Menstrual status:  postmenopausal Height:      63 inches Weight:      136 pounds BMI:     24.18 Pulse rate:   66 / minute BP sitting:   120 / 80  (left arm) Cuff size:   regular  Vitals Entered By: [name], CMA (AAMA) (July 18, 2010 10:34 AM) CC: Follow-up visit on meds   History of Present Illness: Nicole [name] comes in today  for follow up meds  medical  evaluations  Since last visit  here  there have been no major changes in health status  . LIPIDs: no se . of meds  exercising 4 miles at time and feels well  GERD:   trying to wean.    and  then gets signs aftera beer of wine  and caffiene and gets signs  and has to take ranitidine.     Has seen GY[facility] :     normal exam.  Breast cancer : no recurrence  Mood : stable .  as needed meds   Preventive Screening-Counseling & Management  Alcohol-Tobacco     Alcohol drinks/day: <1     Alcohol type: all     Smoking Status: quit     Year Quit: 25 years ago  Caffeine-Diet-Exercise     Caffeine use/day: <1     Does Patient Exercise: yes     Type of exercise: Walking, Yoga     Times/week: 5  Current Medications (verified): 1)  Valtrex 1 Gm Tabs (Valacyclovir Hcl) .Marland Kitchen.. 1 By Mouth Three Times A Day or As Directed 2)  Mevacor 20 Mg  Tabs (Lovastatin) .Marland Kitchen.. 1 By Mouth Once Daily 3)  Zantac 150 Maximum Strength 150 Mg  Tabs (Ranitidine Hcl) .Marland Kitchen.. 1 By Mouth Two Times A Day 4)  Calcium 600 1500 Mg Tabs (Calcium Carbonate) 5)  Vitamin D (Ergocalciferol) 50000 Unit Caps (Ergocalciferol) .Marland Kitchen.. 1 Q 2 Weeks 6)  Magnesium 300 Mg Caps (Magnesium) 7)  Fish Oil   Oil (Fish Oil) 8)  Flax   Oil (Flaxseed (Linseed)) 9)  Ativan 0.5 Mg Tabs (Lorazepam) .Marland Kitchen.. 1-2 By Mouth As Needed Sleep 10)  Daily Multiple Vitamins  Tabs (Multiple Vitamin) .... Once Day  Allergies (verified): 1)  ! Sulfa  Past History:  Past medical, surgical, family and social  histories (including risk factors) reviewed, and no changes noted (except as noted below).  Past Medical History: Reviewed history from 11/10/2008 and no changes required. Breast cancer, hx of BRACA 1 Headache Rheumatoid factor ? OA  GERD  [name] yearly visit.  no reg rheumatologist  [name]    Past Surgical History: Reviewed history from 10/13/2007 and no changes required. Lumpectomy Double Mastectomy Ovaries Removed  Past History:  Care Management: Gynecology: [name] Gastroenterology: [name] Age   Family History: Reviewed history from 05/17/2009 and no changes required. Family History of Cardiovascular disorder-Mom and Maternal Grandfather- Stroke, Heart Attack- Brother Family History of Arthritis- Maternal Side RA  cousin  Family History Kidney disease- Maternal Side Family History Diabetes 1st degree relative- Paternal Side Family History of Respiratory disease- CF- Sister Mom had stroke at 49       Social History: Reviewed history from 11/10/2008 and no changes required. Occupation: Retired school sysstem Married  Former Smoker Alcohol use-yes Drug use-no Regular exercise-yes 30/day 5 days a week   household of 2 husband   Review  of Systems  The patient denies anorexia, fever, weight loss, weight gain, vision loss, decreased hearing, hoarseness, chest pain, syncope, prolonged cough, abdominal pain, melena, hematochezia, severe indigestion/heartburn, hematuria, muscle weakness, difficulty walking, depression, unusual weight change, abnormal bleeding, and enlarged lymph nodes.    Physical Exam  General:  Well-developed,well-nourished,in no acute distress; alert,appropriate and cooperative throughout examination Head:  normocephalic and atraumatic.   Eyes:  PERRL, EOMs full, conjunctiva clear  Nose:  no external deformity and no nasal discharge.   Mouth:  pharynx pink and moist.   Neck:  No deformities,  masses, or tenderness noted. Lungs:  Normal respiratory effort, chest expands symmetrically. Lungs are clear to auscultation, no crackles or wheezes. Heart:  Normal rate and regular rhythm. S1 and S2 normal without gallop, murmur, click, rub or other extra sounds. Abdomen:  Bowel sounds positive,abdomen soft and non-tender without masses, organomegaly or   noted. Msk:  no acute swelling or redness Pulses:  pulses intact without delay   Extremities:  no clubbing cyanosis or edema  Neurologic:  alert & oriented X3, strength normal in all extremities, and gait normal.   Skin:  turgor normal, color normal, no suspicious lesions, and no ecchymoses.   Cervical Nodes:  No lymphadenopathy noted Psych:  Oriented X3, normally interactive, good eye contact, not anxious appearing, and not depressed appearing.     Impression & Recommendations:  Problem # 1:  HYPERLIPIDEMIA (ICD-272.2)  Her updated medication list for this problem includes:    Mevacor 20 Mg Tabs (Lovastatin) .Marland Kitchen... 1 by mouth once daily  Orders: TLB-Hepatic/Liver Function Pnl (80076-HEPATIC) TLB-Lipid Panel (80061-LIPID)  Labs Reviewed: SGOT: 25 (08/14/2009)   SGPT: 28 (08/14/2009)  Lipid Goals: Chol Goal: 200 (10/29/2007)   HDL Goal: 40 (10/29/2007)   LDL Goal: 160 (10/29/2007)   TG Goal: 150 (10/29/2007)  Prior 10 Yr Risk Heart Disease: Not enough information (10/29/2007)   HDL:50.20 (08/14/2009), 61.8 (06/12/2008)  LDL:103 (08/14/2009), 90 (16/08/9603)  Chol:190 (08/14/2009), 170 (06/12/2008)  Trig:185.0 (08/14/2009), 93 (06/12/2008)  Problem # 2:  ELEVATED BP READING WITHOUT DX HYPERTENSION (ICD-796.2) Assessment: Improved lifestyle intervention controlled    Problem # 3:  HSV (ICD-054.9) stable   Problem # 4:  BREAST CANCER, HX OF (ICD-V10.3) brac 1 gene   Problem # 5:  ARTHRITIS, HANDS, BILATERAL (ICD-716.94) Assessment: Comment Only  Complete Medication List: 1)  Valtrex 1 Gm Tabs (Valacyclovir hcl) .Marland Kitchen.. 1 by  mouth three times a day or as directed 2)  Mevacor 20 Mg Tabs (Lovastatin) .Marland Kitchen.. 1 by mouth once daily 3)  Zantac 150 Maximum Strength 150 Mg Tabs (Ranitidine hcl) .Marland Kitchen.. 1 by mouth two times a day 4)  Calcium 600 1500 Mg Tabs (Calcium carbonate) 5)  Vitamin D (ergocalciferol) 50000 Unit Caps (Ergocalciferol) .Marland Kitchen.. 1 q 2 weeks 6)  Magnesium 300 Mg Caps (Magnesium) 7)  Fish Oil Oil (Fish oil) 8)  Flax Oil (Flaxseed (linseed)) 9)  Ativan 0.5 Mg Tabs (Lorazepam) .Marland Kitchen.. 1-2 by mouth as needed sleep 10)  Daily Multiple Vitamins Tabs (Multiple vitamin) .... Once day  Other Orders: UA Dipstick w/o Micro (automated)  (81003) Specimen Handling (54098) TLB-TSH (Thyroid Stimulating Hormone) (84443-TSH) TLB-CBC Platelet - w/Differential (85025-CBCD) TLB-BMP (Basic Metabolic Panel-BMET) (80048-METABOL)  Patient Instructions: 1)  You will be informed of lab results when available.  2)  check on shots .can make immunization  only  appt if needed 3)  .follow up depending on labs.  Prescriptions: VALTREX 1 GM TABS (VALACYCLOVIR HCL) 1 by mouth three times a  day or as directed  #30 x 2   Entered and Authorized by:   Madelin Headings MD   Signed by:   Madelin Headings MD on 07/18/2010   Method used:   Electronically to        [id number] (retail)       [address] [place], [place]  [address]       Ph: [phone]       Fax: [phone]   RxID:   [id number]   Laboratory Results   Urine Tests  Date/Time Recieved: July 18, 2010 1:29 PM  Date/Time Reported: July 18, 2010 1:29 PM   Routine Urinalysis   Color: yellow Appearance: Clear Glucose: negative   (Normal Range: Negative) Bilirubin: negative   (Normal Range: Negative) Ketone: negative   (Normal Range: Negative) Spec. Gravity: 1.010   (Normal Range: 1.003-1.035) Blood: negative   (Normal Range: Negative) pH: 7.0   (Normal Range: 5.0-8.0) Protein: negative   (Normal Range:  Negative) Urobilinogen: 0.2   (Normal Range: 0-1) Nitrite: negative   (Normal Range: Negative) Leukocyte Esterace: negative   (Normal Range: Negative)    Comments: Wynona Canes, CMA  July 18, 2010 1:29 PM

## 2010-12-26 NOTE — Progress Notes (Signed)
Summary: Vit d dosage  Phone Note Call from Patient Call back at Home Phone [phone]   Caller: Patient Call For: Madelin Headings MD Summary of Call: Is asking how often she is to take the Vitamin D.  She thought she was taking it q o week, but pres says q week. Initial call taken by: [facility] CMA AAMA,  December 18, 2010 11:24 AM  Follow-up for Phone Call        we have not been prescribing this .     as per our records.  however the list says she is taking it every other week. Aascertain who wrot the original rx.  for advice Follow-up by: Madelin Headings MD,  December 19, 2010 10:25 PM  Additional Follow-up for Phone Call Additional follow up Details #1::        Dr. Tresa Res is prescribing MD Additional Follow-up by: [facility] CMA AAMA,  December 20, 2010 10:34 AM

## 2010-12-26 NOTE — Progress Notes (Signed)
Summary: refill generics  Phone Note Refill Request Call back at Home Phone [phone] Message from:  Patient---live call  Refills Requested: Medication #1:  MEVACOR 20 MG  TABS 1 by mouth once daily   Brand Name Necessary? No  Medication #2:  ZANTAC 150 MAXIMUM STRENGTH 150 MG  TABS 1 by mouth two times a day   Brand Name Necessary? No wants 1 year rx. would like to pick up rxs when ready.  Initial call taken by: Warnell Forester,  September 16, 2010 10:05 AM  Follow-up for Phone Call        Spoke with pt and she wants a 90 day supply. I told pt that I could sent it to [facility] if she would like. Pt wants [place] to do this but will call [facility] to make sure they have it before she goes there. Then she will call us if she has any problems. Follow-up by: [name], CMA [place]),  September 16, 2010 10:58 AM    Prescriptions: ZANTAC 150 MAXIMUM STRENGTH 150 MG  TABS (RANITIDINE HCL) 1 by mouth two times a day  #180 x 3   Entered by:   [name], CMA (AAMA)   Authorized by:   Madelin Headings MD   Signed by:   [name], CMA (AAMA) on 09/16/2010   Method used:   Electronically to        [id number] (retail)       [address] [place], [place]  [address]       Ph: [phone]       Fax: [phone]   RxID:   [id number] MEVACOR 20 MG  TABS (LOVASTATIN) 1 by mouth once daily  #90 x 3   Entered by:   [name], CMA (AAMA)   Authorized by:   Madelin Headings MD   Signed by:   [name], CMA (AAMA) on 09/16/2010   Method used:   Electronically to        [id number] (retail)       [address] [place], [place]  [address]       Ph: [phone]       Fax: [phone]   RxID:   [id number]

## 2010-12-26 NOTE — Letter (Signed)
Summary: [facility]  [facility]   Imported By: Maryln Gottron 03/01/2010 12:19:11  _____________________________________________________________________  External Attachment:    Type:   Image     Comment:   External Document

## 2010-12-26 NOTE — Assessment & Plan Note (Signed)
Summary: finger swollen/mhf   Vital Signs:  Patient profile:   65 year old female Menstrual status:  postmenopausal Height:      63 inches Weight:      146 pounds BMI:     25.96 Pulse rate:   66 / minute BP sitting:   120 / 80  (left arm) Cuff size:   regular  Vitals Entered By: [name], CMA (May 17, 2009 10:25 AM) CC: Left hand swelling that started a few years ago but in the past 6 months the pain has started. Pt has tested postive for RA. LMP - Character: 67 Menarche (age onset): 17 years   days  Menstrual Status postmenopausal Last PAP Result Normal   History of Present Illness: Nicole [name] comes in today for problem with hand and finger pain and swelling . am stiffness.   and discomfort over the last 6 months is getting worse. Pain between the finges also r .  Back and neck pain and crunching. but may not be related .    Remote hx of  evaluation   of rheum  arthritis    hand and feet x rays and positive lab test.  Rec to watch .  Can walk  3 miles and does ok with some crunching in knees.   No unusal  rashes or bumps.   No systemic signs .    Breast cancer  no recurrence in 10 years.     Preventive Screening-Counseling & Management  Alcohol-Tobacco     Alcohol drinks/day: <1     Alcohol type: Wine, beer     Smoking Status: quit     Year Quit: 25 years ago  Caffeine-Diet-Exercise     Caffeine use/day: <1     Does Patient Exercise: yes     Type of exercise: Walking, Yoga     Times/week: 5  Current Medications (verified): 1)  Valtrex 1 Gm Tabs (Valacyclovir Hcl) .Marland Kitchen.. 1 By Mouth Three Times A Day or As Directed 2)  Mevacor 20 Mg  Tabs (Lovastatin) .Marland Kitchen.. 1 By Mouth Once Daily 3)  Zantac 150 Maximum Strength 150 Mg  Tabs (Ranitidine Hcl) .Marland Kitchen.. 1 By Mouth Two Times A Day 4)  Premarin 0.625 Mg/gm Crea (Estrogens, Conjugated) 5)  Calcium 600 1500 Mg Tabs (Calcium Carbonate) 6)  Vitamin D (Ergocalciferol) 50000 Unit Caps (Ergocalciferol) 7)  B  Complex Vitamins  Caps (B Complex Vitamins) 8)  Magnesium 300 Mg Caps (Magnesium) 9)  Fish Oil   Oil (Fish Oil) 10)  Flax   Oil (Flaxseed (Linseed)) 11)  Vitamin E 100 Unit Caps (Vitamin E) 12)  Apple Pectin  Allergies (verified): 1)  ! Sulfa  Past History:  Past medical, surgical, family and social histories (including risk factors) reviewed, and no changes noted (except as noted below).  Past Medical History: Reviewed history from 11/10/2008 and no changes required. Breast cancer, hx of BRACA 1 Headache Rheumatoid factor ? OA  GERD  [facility] [name] yearly visit.  no reg rheumatologist  [name]    Past Surgical History: Reviewed history from 10/13/2007 and no changes required. Lumpectomy Double Mastectomy Ovaries Removed  Past History:  Care Management: Gynecology: [name] Gastroenterology: [name]  Family History: Reviewed history from 06/12/2008 and no changes required. Family History of Cardiovascular disorder-Mom and Maternal Grandfather- Stroke, Heart Attack- Brother Family History of Arthritis- Maternal Side RA  cousin  Family History Kidney disease- Maternal Side Family History Diabetes 1st degree relative- Paternal Side Family History of Respiratory  disease- CF- Sister Mom had stroke at 72       Social History: Reviewed history from 11/10/2008 and no changes required. Occupation: Retired school sysstem Married  Former Smoker Alcohol use-yes Drug use-no Regular exercise-yes 30/day 5 days a week   household of 2 husband  Caffeine use/day:  <1  Review of Systems  The patient denies anorexia, fever, weight loss, vision loss, chest pain, syncope, dyspnea on exertion, peripheral edema, prolonged cough, abdominal pain, melena, hematochezia, severe indigestion/heartburn, transient blindness, difficulty walking, unusual weight change, abnormal bleeding, enlarged lymph nodes, and angioedema.    Physical Exam  General:   Well-developed,well-nourished,in no acute distress; alert,appropriate and cooperative throughout examination Head:  normocephalic and atraumatic.   Eyes:  PERRL, EOMs full, conjunctiva clear  Neck:  No deformities, masses, or tenderness noted. Lungs:  Normal respiratory effort, chest expands symmetrically. Lungs are clear to auscultation, no crackles or wheezes. Heart:  Normal rate and regular rhythm. S1 and S2 normal without gallop, murmur, click, rub or other extra sounds. Abdomen:  Bowel sounds positive,abdomen soft and non-tender without masses, organomegaly or  noted. Msk:  hnads with swelling and mcp jpinmts without redness or warmth   ? sllight ulnar deviation wrist ? mild swelling   fingers in line   Feet no acute changes  Knees  crepitus on knee caps but no swelling or renderness and nl ROM.  Pulses:  pulses intact without delay   Extremities:  no clubbing cyanosis or edema except above Neurologic:  non focal  Skin:  turgor normal, color normal, no ecchymoses, and no petechiae.   Cervical Nodes:  No lymphadenopathy noted Psych:  Normal eye contact, appropriate affect. Cognition appears normal.    Impression & Recommendations:  Problem # 1:  ARTHRITIS, HANDS, BILATERAL (ICD-716.94)  see below  strong family hx of RA .     disc evlauation . will refer  for evaluation for poss ra or inflammatory arthritis early   Orders: Rheumatology Referral (Rheumatology)  Problem # 2:  ? of RHEUMATOID ARTHRITIS POS SEROLOGY (ICD-714.0)  remote hx of evaluation  neg  but serology but now with  6 months or am stiffness and hand aching and swelling not severe but persistent and progressive  .         Orders: Rheumatology Referral (Rheumatology)  Problem # 3:  BREAST CANCER, HX OF (ICD-V10.3) 10 years out and released from Cancer center to check as needed.   Complete Medication List: 1)  Valtrex 1 Gm Tabs (Valacyclovir hcl) .Marland Kitchen.. 1 by mouth three times a day or as directed 2)  Mevacor 20  Mg Tabs (Lovastatin) .Marland Kitchen.. 1 by mouth once daily 3)  Zantac 150 Maximum Strength 150 Mg Tabs (Ranitidine hcl) .Marland Kitchen.. 1 by mouth two times a day 4)  Premarin 0.625 Mg/gm Crea (Estrogens, conjugated) 5)  Calcium 600 1500 Mg Tabs (Calcium carbonate) 6)  Vitamin D (ergocalciferol) 50000 Unit Caps (Ergocalciferol) 7)  B Complex Vitamins Caps (B complex vitamins) 8)  Magnesium 300 Mg Caps (Magnesium) 9)  Fish Oil Oil (Fish oil) 10)  Flax Oil (Flaxseed (linseed)) 11)  Vitamin E 100 Unit Caps (Vitamin e) 12)  Apple Pectin   Patient Instructions: 1)  Plan rheumatology referral for poss early RA

## 2010-12-26 NOTE — Assessment & Plan Note (Signed)
Summary: poison ivy/dm   Vital Signs:  Patient profile:   65 year old female Menstrual status:  postmenopausal Height:      63 inches Weight:      132 pounds Temp:     98.1 degrees F oral Pulse rate:   79 / minute BP sitting:   110 / 70  (right arm)  Vitals Entered By: Kathrynn Speed CMA (Apr 17, 2010 3:39 PM) CC: Poision Ivy? Rash x 5days with pain w blister/ shooting pain in that area w chest pain, Headache   History of Present Illness: Nicole [name] comes in today  for 5 days of above . this occured after working in yard  with clipping bushes and other vegetations and tool gloves off  and put under her laft armpit  in order to cary her tools in.  soon after she develped this rash itchy like PI and extended  usedin calamine and hcs and now getting tender btut no fever or inc LN.     Also some itchy areas on other parts of arms.    Preventive Screening-Counseling & Management  Alcohol-Tobacco     Alcohol drinks/day: <1     Alcohol type: all     Smoking Status: quit     Year Quit: 25 years ago  Caffeine-Diet-Exercise     Caffeine use/day: <1     Does Patient Exercise: yes     Type of exercise: Walking, Yoga     Times/week: 5  Current Medications (verified): 1)  Valtrex 1 Gm Tabs (Valacyclovir Hcl) .Marland Kitchen.. 1 By Mouth Three Times A Day or As Directed 2)  Mevacor 20 Mg  Tabs (Lovastatin) .Marland Kitchen.. 1 By Mouth Once Daily 3)  Zantac 150 Maximum Strength 150 Mg  Tabs (Ranitidine Hcl) .Marland Kitchen.. 1 By Mouth Two Times A Day 4)  Calcium 600 1500 Mg Tabs (Calcium Carbonate) 5)  Vitamin D (Ergocalciferol) 50000 Unit Caps (Ergocalciferol) 6)  Magnesium 300 Mg Caps (Magnesium) 7)  Fish Oil   Oil (Fish Oil) 8)  Flax   Oil (Flaxseed (Linseed)) 9)  Apple Pectin 10)  Ativan 0.5 Mg Tabs (Lorazepam) .Marland Kitchen.. 1-2 By Mouth As Needed Sleep 11)  Daily Multiple Vitamins  Tabs (Multiple Vitamin) .... Once Day  Allergies (verified): 1)  ! Sulfa  Past History:  Past medical, surgical, family and social  histories (including risk factors) reviewed for relevance to current acute and chronic problems.  Past Medical History: Reviewed history from 11/10/2008 and no changes required. Breast cancer, hx of BRACA 1 Headache Rheumatoid factor ? OA  GERD  [name] yearly visit.  no reg rheumatologist  [name]    Past Surgical History: Reviewed history from 10/13/2007 and no changes required. Lumpectomy Double Mastectomy Ovaries Removed  Family History: Reviewed history from 05/17/2009 and no changes required. Family History of Cardiovascular disorder-Mom and Maternal Grandfather- Stroke, Heart Attack- Brother Family History of Arthritis- Maternal Side RA  cousin  Family History Kidney disease- Maternal Side Family History Diabetes 1st degree relative- Paternal Side Family History of Respiratory disease- CF- Sister Mom had stroke at 40       Social History: Reviewed history from 11/10/2008 and no changes required. Occupation: Retired school sysstem Married  Former Smoker Alcohol use-yes Drug use-no Regular exercise-yes 30/day 5 days a week   household of 2 husband   Review of Systems  The patient denies anorexia, fever, abnormal bleeding, enlarged lymph nodes, and angioedema.    Physical Exam  General:  Well-developed,well-nourished,in no acute  distress; alert,appropriate and cooperative throughout examination Breasts:  lef breast absent  Skin:  left acill a with extensive  blistreed weeping   areas upper arm and axilla  with surrounding erythema   no induration  and no   adenopathy.  Cervical Nodes:  No lymphadenopathy noted Axillary Nodes:  No palpable lymphadenopathy   Impression & Recommendations:  Problem # 1:  CONTACT DERMATITIS, RHUS (ICD-692.6) quite severe in an extensive area hx cw contact dermatitis  will rx for secondary infection Her updated medication list for this problem includes:    Prednisone 20 Mg Tabs (Prednisone)  .Marland Kitchen... Take 3 a day x 3 days then 2 a day x3 days then 1/day x 3 days, then 1/2 a day x 3 days or as directed.  Problem # 2:  ? of INFECTION, SKIN AND SOFT TISSUE (ICD-686.9) poss secondary infection    disc about .   Complete Medication List: 1)  Valtrex 1 Gm Tabs (Valacyclovir hcl) .Marland Kitchen.. 1 by mouth three times a day or as directed 2)  Mevacor 20 Mg Tabs (Lovastatin) .Marland Kitchen.. 1 by mouth once daily 3)  Zantac 150 Maximum Strength 150 Mg Tabs (Ranitidine hcl) .Marland Kitchen.. 1 by mouth two times a day 4)  Calcium 600 1500 Mg Tabs (Calcium carbonate) 5)  Vitamin D (ergocalciferol) 50000 Unit Caps (Ergocalciferol) 6)  Magnesium 300 Mg Caps (Magnesium) 7)  Fish Oil Oil (Fish oil) 8)  Flax Oil (Flaxseed (linseed)) 9)  Apple Pectin  10)  Ativan 0.5 Mg Tabs (Lorazepam) .Marland Kitchen.. 1-2 by mouth as needed sleep 11)  Daily Multiple Vitamins Tabs (Multiple vitamin) .... Once day 12)  Cephalexin 500 Mg Tabs (Cephalexin) .Marland Kitchen.. 1 by mouth two times a day 13)  Prednisone 20 Mg Tabs (Prednisone) .... Take 3 a day x 3 days then 2 a day x3 days then 1/day x 3 days, then 1/2 a day x 3 days or as directed.  Patient Instructions: 1)  cool compresses   2)  antibiotic and prednisone   3)   call if not improving  or fever etc.  4)  I agrees that this is a severe contact dermaitis with possible secondary infection  Prescriptions: PREDNISONE 20 MG TABS (PREDNISONE) Take 3 a day x 3 days then 2 a day x3 days then 1/day x 3 days, then 1/2 a day x 3 days or as directed.  #30 x 0   Entered and Authorized by:   Madelin Headings MD   Signed by:   Madelin Headings MD on 04/17/2010   Method used:   Electronically to        [id number] (retail)       [address] [place], [place]  [address]       Ph: [phone]       Fax: [phone]   RxID:   [id number] CEPHALEXIN 500 MG TABS (CEPHALEXIN) 1 by mouth two times a day  #20 x 0   Entered and Authorized by:   Madelin Headings MD   Signed by:    Madelin Headings MD on 04/17/2010   Method used:   Electronically to        [facility] (retail)       [address] [place], [place]  [address]  Ph: [phone]       Fax: [phone]   RxID:   [id number]

## 2011-01-10 ENCOUNTER — Ambulatory Visit: Payer: Self-pay | Admitting: Cardiology

## 2011-04-08 NOTE — Assessment & Plan Note (Signed)
[facility]                            CARDIOLOGY OFFICE NOTE   NAME:Carnero, DANEEN VOLCY                    MRN:          [id number]  DATE:07/21/2007                            DOB:          May 28, 1946    Ms. Gadway returns today for followup concerning her mitral valve  prolapse.  She was last seen in 2005.  Her last 2D echocardiogram  demonstrated no significant prolapse.  In fact, there may have not been  any prolapse.  This was discussed with her.   She is having no symptoms of prolapse at the present time.  Her biggest  concern today is that of heart disease.  We talked about her risk  factors, which are only age and a high LDL, although her HDL is in the  60s, and her triglycerides are normal.  Her risk overall was quite low,  and I told I would not recommend a statin.   She has unfortunately had breast cancer since we last saw her.  She has  had a bilateral mastectomy, bilateral oophorectomy for left breast  carcinoma with BRCA-1 positivity.  She is followed by Dr. Jeanette Caprice.  She is looking for a primary care physician.   She has a remarkable attitude and is quite humorous.   MEDICATIONS:  1. Estring every 3 months.  2. Calcium 1600 mg a day.  3. Vitamin D.  4. Vitamin A.  5. Magnesium.  6. Flax seed oil and seeds.  7. Vitamin B.  8. Red yeast rice.   PHYSICAL EXAMINATION:  Her blood pressure today is 100/72.  Her pulse 73  and regular.  Weight is 149.  HEENT:  Normocephalic and atraumatic.  PERRLA.  Extraocular movements  are intact.  Sclerae are clear.  Facial symmetry is normal.  NECK:  Carotids are equal bilaterally without bruits.  No JVD.  Thyroid  is not enlarged.  Trachea is midline.  LUNGS:  Clear.  HEART:  A nondisplaced PMI.  She has a soft S1 and S2.  There is no  obvious click.  There is no murmur.  ABDOMEN:  Soft with good bowel sounds.  No midline bruits.  EXTREMITIES:  No clubbing, cyanosis or edema.  Pulses  are intact.  NEUROLOGIC:  Intact.   Electrocardiogram is normal except for minimal voltage criteria for LVH.   ASSESSMENT/PLAN:  Ms. Remund is doing well.  I am not even sure she  has any significant prolapse.  She certainly does not need SBE  prophylaxis.  At this time, I would not recommend a statin.  I have  encouraged her to exercise at least three hours per week, watch her diet  and her weight.  I will see her back in two years.   I have recommended Dr. Berniece Andreas of our [facility] office as her  primary care doctor.  We will try to arrange an appointment.     Thomas C. Daleen Squibb, MD, [facility]  Electronically Signed    TCW/MedQ  DD: 07/21/2007  DT: 07/21/2007  Job #: [id number]

## 2011-04-11 NOTE — Op Note (Signed)
NAME:  Nicole [name], Nicole [name]             ACCOUNT NO.:  [id number]   MEDICAL RECORD NO.:  [id number]          PATIENT TYPE:  OBV   LOCATION:  9399                          FACILITY:  [facility]   PHYSICIAN:  Cynthia P. Romine, M.D.DATE OF BIRTH:  May 04, 1946   DATE OF PROCEDURE:  08/11/2005  DATE OF DISCHARGE:                                 OPERATIVE REPORT   PREOPERATIVE DIAGNOSIS:  Left breast carcinoma with CRCA 1 positivity,  desirous of a prophylactic bilateral oophorectomy and bilateral mastectomy.   POSTOPERATIVE DIAGNOSIS:  Left breast carcinoma with CRCA 1 positivity,  desirous of a prophylactic bilateral oophorectomy and bilateral mastectomy,  pathology pending.   PROCEDURE:  Laparoscopic bilateral salpingo-oophorectomy.  The bilateral  mastectomy will be dictated separately by [name], M.D.   SURGEON:  Cynthia P. Romine, M.D.   ASSISTANT:  Andres Ege, M.D.   ANESTHESIA:  General endotracheal.   ESTIMATED BLOOD LOSS:  Minimal.   COMPLICATIONS:  None.   PROCEDURE:  The patient was taken to the operating room and after the  induction of adequate general anesthesia was placed in the dorsal lithotomy  position and prepped and draped in the usual fashion.  A posterior weighted  and anterior Sims retractor were placed.  The cervix was grasped on its  anterior lip with a single-tooth tenaculum.  An acorn uterine manipulator  was placed.  The bladder was drained with a Foley catheter.  The room was  kept latex-free after the patient noted a LATEX allergy.  The patient was  prepped and draped in the usual fashion.  A subumbilical incision was made  after injecting the incision site with 0.5% Marcaine with epinephrine.  A  vertical skin incision was made at the umbilicus to go along with the curve  of the patient's umbilicus, and the Veress needle was inserted into the  peritoneal space.  Proper placement was tested by noting free flow in the  Veress needle and  negative aspirate and then by noting the response of a  drop of saline placed to the Veress needle with negative pressure as the  abdominal wall was elevated.  Pneumoperitoneum was created with 7 L of CO2.  A disposable 11 mm trocar was inserted into the peritoneal space and in its  proper place noted with the laparoscope.  Upon examining the pelvis, it was  noted that the acorn manipulator had perforated through the cervix and into  the posterior cul-de-sac.  Therefore, the manipulator was removed and  attention was turned back to the vagina.  The cervix was dilated to a #13  Shawnie Pons and the Hulka uterine manipulator was able to be inserted in its  proper position in the uterus.  Observing it through the laparoscope, it was  noted to be in the proper position of the uterus.  The uterus was moved  easily with manipulation of the Hulka.  The hole in the posterior cul-de-sac  from the acorn manipulator was not bleeding and required no intervention.  The pelvis was inspected.  The tubes and ovaries and uterus all appeared  normal, as did the appendix.  The pedicle containing the utero-ovarian  ligament and the tube was cauterized with the tripolar and cut, and this was  continued along the length of the utero-ovarian ligament and the round  ligament until it was felt that there was a small enough pedicle at  infundibulopelvic ligament that a 0 Vicryl Endoloop could be applied.  Two 0  Vicryl Endoloops were applied on the patient's right, clearly identifying  the ureter before tying down the knot on the infundibulopelvic ligament.  The specimen was cut through with the scissors and placed in the cul-de-sac.  The procedure was repeated on the patient's left, cauterizing the pedicle  containing the tube and utero-ovarian ligament, cutting, and then using 0  Vicryl Endoloops on the infundibulopelvic ligament.  Again the ureter on  that side was identified before tying down the knot on the  infundibulopelvic  ligament.  The specimen was removed with scissors and placed in the  posterior cul-de-sac.  The specimens were cut in several pieces so that they  would fit through the sleeve of the trocar in the abdomen, and the specimens  were pulled out in several pieces without difficulty.  The pelvis was  irrigated with a Doctor, hospital.  The pedicles were dry.  Photographic documentation was taken of the pedicles after excision of the  ovaries.  The uterus was of normal size, shape and contour, and again  photographic documentation was taken.  The ovaries had been photographed  prior to their removal.  The instruments were removed from the abdomen.  The  pneumoperitoneum was allowed to escape.  The incisions were closed  subcuticularly with 3-0 Vicryl.  Steri-Strips were applied.  The manipulator  was removed from the uterus and then Dr. Johna Sheriff began the mastectomy.  There were no complications to the laparoscopic BSO.  Dr. Johna Sheriff will  dictate the operative note for the mastectomy separately.           ______________________________  Edwena Felty. Romine, M.D.     CPR/MEDQ  D:  08/11/2005  T:  08/11/2005  Job:  [id number]   cc:   Lorne Skeens. Hoxworth, M.D.  [address]  [place]  [place] [address]   Amada Kingfisher, P.A.

## 2011-04-11 NOTE — Op Note (Signed)
NAME:  Nicole [name], Nicole [name]             ACCOUNT NO.:  [id number]   MEDICAL RECORD NO.:  [id number]          PATIENT TYPE:  OBV   LOCATION:  9399                          FACILITY:  [facility]   PHYSICIAN:  [name], M.D.DATE OF BIRTH:  [birth year]   DATE OF PROCEDURE:  08/11/2005  DATE OF DISCHARGE:                                 OPERATIVE REPORT   PREOPERATIVE DIAGNOSES:  Personal history of breast cancer and BRCA-1 gene  mutation.   POSTOPERATIVE DIAGNOSES:  Personal history of breast cancer and BRCA-1 gene  mutation.   SURGICAL PROCEDURE:  Bilateral total mastectomy.   SURGEON:  Lorne Skeens. Hoxworth, M.D.   ANESTHESIA:  General.   BRIEF HISTORY:  [name] is a 65 year old female with a personal history  of left breast cancer status post lumpectomy and radiation and chemotherapy  approximately three years ago.  She has a family history of breast cancer as  well and underwent genetic evaluation and was found to be BRCA-1 positive.  After extensive discussion,we have elected to proceed with bilateral  laparoscopic oophorectomy by Dr. Tresa Res and bilateral total mastectomy.  The  nature of the procedure, indications, risks of bleeding, infection and  residual tiny risk of breast cancer, were discussed and understood.  Following completion of uncomplicated oophorectomy, we are proceeding with  bilateral mastectomy.   DESCRIPTION OF OPERATION:  The entire chest, neck and upper arms were widely  sterilely prepped and draped with the arms extended.  The left side was  approached.  An elliptical incision was made encompassing the nipple-areolar  complex and the previous lumpectomy sites.  Skin and subcutaneous flaps were  then raised superiorly toward the clavicle, medially to the edge of the  sternum, inferiorly to the origins of the rectus sheath and laterally out to  the edge of the latissimus dorsi.  The breast was then reflected up off the  pectoralis major, leaving the  fascia as much as possible using cautery.  The  breast was detached from the serratus, freed from the lateral edge of the  pectoralis major.  The clavipectoral fascia was incised and the axilla  proper entered.  We could see where the lumpectomy was at the base of the  specimen, which was normal-appearing scarring.  There was no noticeable  adenopathy.  At the low end of the axilla, the specimen was divided between  clamps, removed and tied with 3-0 Vicryl ties.  The wound was irrigated and  complete hemostasis assured.  Following this an identical mastectomy was  performed on the right side with no abnormal findings.  In each case a  closed-suction drain was left beneath the flaps and brought out through a  separate stab wound.  The skin was closed with staples.  Sponge, needle and  instrument counts were correct.  Dry sterile dressings were applied and the  patient taken to recovery in good condition.      Lorne Skeens. Hoxworth, M.D.  Electronically Signed    BTH/MEDQ  D:  08/11/2005  T:  08/11/2005  Job:  [id number]

## 2011-04-11 NOTE — Op Note (Signed)
NAME:  Nicole [name], Nicole [name]             ACCOUNT NO.:  [id number]   MEDICAL RECORD NO.:  [id number]          PATIENT TYPE:  AMB   LOCATION:  ENDO                         FACILITY:  [facility]   PHYSICIAN:  Bernette Redbird, M.D.   DATE OF BIRTH:  17-Dec-1945   DATE OF PROCEDURE:  06/25/2005  DATE OF DISCHARGE:                                 OPERATIVE REPORT   PROCEDURE:  Colonoscopy.   ENDOSCOPIST:  Bernette Redbird, M.D.   INDICATION:  Screening for colon cancer in a 65 year old female, with family  history of colon polyps in her mother.  The patient had negative colonoscopy  by me approximately five years ago. In the interim, she is awaiting  apparently oophorectomy and mastectomy prophylactically because of the  detected genetic mutation which places her at risk for female malignancies,  according to my understanding.   FINDINGS:  Normal exam to the terminal ileum.   DESCRIPTION OF PROCEDURE:  The nature, purpose, risks of the procedure were  familiar to the patient from prior examination, and she provided written  consent.  Sedation was fentanyl 70 mcg and Versed 6 mg IV without  arrhythmias or desaturation.  The Olympus adjustable tension pediatric video  colonoscope was advanced with mild difficulty due to looping, overcome by  external abdominal compression.  The terminal ileum was entered for a short  distance and appeared normal. The appendiceal orifice was clearly  visualized.  The quality of prep was excellent, and it was felt that all  areas were well seen.   This was a normal examination.  No polyps, cancer, colitis, vascular  malformations or diverticulosis were noted. Retroflexion in the rectum and  reinspection of the rectum were normal.   The patient tolerated the procedure well and there no apparent  complications.  No biopsies were obtained.   IMPRESSION:  Normal screening colonoscopy with a patient with a family  history of colon polyps (V7 6.51).   PLAN:  Repeat  colonoscopy in five years.       RB/MEDQ  D:  06/25/2005  T:  06/26/2005  Job:  [id number]   cc:   Lorne Skeens. Hoxworth, M.D.  [address]  [place]  [place] [address]   Gaspar Garbe, M.D.  [address]  [place]  [place] [address]  Fax: [phone]   Valentino Hue. Magrinat, M.D.  [address] [facility]  [place]  [place] [address]  Fax: [phone]   Andres Ege, M.D.  [address]  [place]  [place] [address]  Fax: [phone]   Edwena Felty. Romine, M.D.  [address]  [place]  [place] [address]  Fax: [phone]

## 2011-04-30 ENCOUNTER — Encounter: Payer: Self-pay | Admitting: Internal Medicine

## 2011-06-04 ENCOUNTER — Ambulatory Visit (INDEPENDENT_AMBULATORY_CARE_PROVIDER_SITE_OTHER): Payer: BC Managed Care – PPO | Admitting: Cardiology

## 2011-06-04 ENCOUNTER — Encounter: Payer: Self-pay | Admitting: Cardiology

## 2011-06-04 VITALS — BP 117/71 | HR 77 | Resp 14 | Ht 62.0 in | Wt 138.0 lb

## 2011-06-04 DIAGNOSIS — R072 Precordial pain: Secondary | ICD-10-CM | POA: Insufficient documentation

## 2011-06-04 DIAGNOSIS — R079 Chest pain, unspecified: Secondary | ICD-10-CM | POA: Insufficient documentation

## 2011-06-04 DIAGNOSIS — E782 Mixed hyperlipidemia: Secondary | ICD-10-CM

## 2011-06-04 DIAGNOSIS — R002 Palpitations: Secondary | ICD-10-CM | POA: Insufficient documentation

## 2011-06-04 DIAGNOSIS — I872 Venous insufficiency (chronic) (peripheral): Secondary | ICD-10-CM

## 2011-06-04 NOTE — Patient Instructions (Signed)
Your physician recommends that you schedule a follow-up appointment in: as needed with Dr. Wall  

## 2011-06-04 NOTE — Progress Notes (Signed)
HPI Nicole [name] returns today for evaluation management of her history of atypical chest pain and palpitations.  She's having no more symptoms. I referred her to Dr Fabian Sharp for primary care. She is really enjoyed that relationship.  She is on lovastatin for a high C-reactive protein and primary prevention. Numbers reviewed.  EKG is normal today. Past Medical History  Diagnosis Date  . Hx of breast cancer   . Headache   . Rheumatoid factor positive     OA  . GERD (gastroesophageal reflux disease)     Past Surgical History  Procedure Date  . Breast lumpectomy   . Double masectomy   . Ovaries removed     Family History  Problem Relation Age of Onset  . Arthritis      maternal side RA cousin  . Kidney disease      maternal side  . Diabetes      1st degree relative, paternal side    History   Social History  . Marital Status: Married    Spouse Name: N/A    Number of Children: N/A  . Years of Education: N/A   Occupational History  . Not on file.   Social History Main Topics  . Smoking status: Former Games developer  . Smokeless tobacco: Not on file  . Alcohol Use: Yes  . Drug Use: No  . Sexually Active: Not on file   Other Topics Concern  . Not on file   Social History Narrative   Married, 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 Designated Party release signed 04/17/10.    Allergies  Allergen Reactions  . Sulfonamide Derivatives     Current Outpatient Prescriptions  Medication Sig Dispense Refill  . calcium carbonate (OS-CAL) 600 MG TABS Take 600 mg by mouth 3 (three) times daily with meals.        . ergocalciferol (VITAMIN D2) 50000 UNITS capsule Take 50,000 Units by mouth every 14 (fourteen) days.        . fish oil-omega-3 fatty acids 1000 MG capsule Take 1 g by mouth daily.       . Flax OIL Take 1 tablet by mouth daily.       Marland Kitchen lovastatin (MEVACOR) 20 MG tablet Take 20 mg by mouth daily.        . Magnesium 300 MG CAPS Take  1 capsule by mouth daily.       . NON FORMULARY Vit A and Vit E and Vit B  1 tab po qd       . ranitidine (ZANTAC 150 MAXIMUM STRENGTH) 150 MG tablet Take 150 mg by mouth as needed.       . valACYclovir (VALTREX) 1000 MG tablet Take 1,000 mg by mouth as needed. Or as directed.        ROS Negative other than HPI.   PE General Appearance: well developed, well nourished in no acute distress HEENT: symmetrical face, PERRLA, good dentition  Neck: no JVD, thyromegaly, or adenopathy, trachea midline Chest: symmetric without deformity Cardiac: PMI non-displaced, RRR, normal S1, S2, no gallop or murmur Lung: clear to ausculation and percussion Vascular: all pulses full without bruits  Abdominal: nondistended, nontender, good bowel sounds, no HSM, no bruits Extremities: no cyanosis, clubbing or edema, no sign of DVT, no varicosities  Skin: normal color, no rashes Neuro: alert and oriented x 3, non-focal Pysch: normal affect Filed Vitals:   06/04/11 1105  BP: 117/71  Pulse: 77  Resp: 14  Height: 5\' 2"  (1.575 m)  Weight: 138 lb (62.596 kg)    EKG  Labs and Studies Reviewed.   Lab Results  Component Value Date   WBC 5.3 07/18/2010   HGB 12.7 07/18/2010   HCT 37.5 07/18/2010   MCV 90.7 07/18/2010   PLT 243.0 07/18/2010      Chemistry      Component Value Date/Time   NA 138 07/18/2010 1114   K 4.1 07/18/2010 1114   CL 100 07/18/2010 1114   CO2 30 07/18/2010 1114   BUN 16 07/18/2010 1114   CREATININE 0.8 07/18/2010 1114      Component Value Date/Time   CALCIUM 9.5 07/18/2010 1114   ALKPHOS 57 07/18/2010 1114   AST 25 07/18/2010 1114   ALT 18 07/18/2010 1114   BILITOT 0.8 07/18/2010 1114       Lab Results  Component Value Date   CHOL 227* 07/18/2010   CHOL 190 08/14/2009   CHOL 170 06/12/2008   Lab Results  Component Value Date   HDL 76.50 07/18/2010   HDL 04.54 08/14/2009   HDL 09.8 06/12/2008   Lab Results  Component Value Date   LDLCALC 103* 08/14/2009   LDLCALC 90  06/12/2008   LDLCALC 105* 01/06/2008   Lab Results  Component Value Date   TRIG 94.0 07/18/2010   TRIG 185.0* 08/14/2009   TRIG 93 06/12/2008   Lab Results  Component Value Date   CHOLHDL 3 07/18/2010   CHOLHDL 4 08/14/2009   CHOLHDL 2.8 CALC 06/12/2008   No results found for this basename: HGBA1C   Lab Results  Component Value Date   ALT 18 07/18/2010   AST 25 07/18/2010   ALKPHOS 57 07/18/2010   BILITOT 0.8 07/18/2010   Lab Results  Component Value Date   TSH 1.04 07/18/2010

## 2011-06-04 NOTE — Assessment & Plan Note (Signed)
Improved. No further evaluation.

## 2011-06-04 NOTE — Assessment & Plan Note (Signed)
Improved. No further evaluation. 

## 2011-07-02 ENCOUNTER — Encounter: Payer: Self-pay | Admitting: Internal Medicine

## 2011-10-06 ENCOUNTER — Other Ambulatory Visit: Payer: Self-pay | Admitting: Internal Medicine

## 2011-10-08 ENCOUNTER — Ambulatory Visit (INDEPENDENT_AMBULATORY_CARE_PROVIDER_SITE_OTHER): Payer: BC Managed Care – PPO | Admitting: Internal Medicine

## 2011-10-08 ENCOUNTER — Encounter: Payer: Self-pay | Admitting: Internal Medicine

## 2011-10-08 VITALS — BP 120/80 | HR 72 | Wt 139.0 lb

## 2011-10-08 DIAGNOSIS — R002 Palpitations: Secondary | ICD-10-CM

## 2011-10-08 DIAGNOSIS — M949 Disorder of cartilage, unspecified: Secondary | ICD-10-CM

## 2011-10-08 DIAGNOSIS — R894 Abnormal immunological findings in specimens from other organs, systems and tissues: Secondary | ICD-10-CM

## 2011-10-08 DIAGNOSIS — Z853 Personal history of malignant neoplasm of breast: Secondary | ICD-10-CM

## 2011-10-08 DIAGNOSIS — E782 Mixed hyperlipidemia: Secondary | ICD-10-CM

## 2011-10-08 DIAGNOSIS — R768 Other specified abnormal immunological findings in serum: Secondary | ICD-10-CM

## 2011-10-08 DIAGNOSIS — M899 Disorder of bone, unspecified: Secondary | ICD-10-CM

## 2011-10-08 DIAGNOSIS — M4120 Other idiopathic scoliosis, site unspecified: Secondary | ICD-10-CM

## 2011-10-08 DIAGNOSIS — Z0000 Encounter for general adult medical examination without abnormal findings: Secondary | ICD-10-CM

## 2011-10-08 DIAGNOSIS — Z23 Encounter for immunization: Secondary | ICD-10-CM

## 2011-10-08 LAB — HEPATIC FUNCTION PANEL
ALT: 18 U/L (ref 0–35)
AST: 27 U/L (ref 0–37)
Alkaline Phosphatase: 59 U/L (ref 39–117)
Total Bilirubin: 0.5 mg/dL (ref 0.3–1.2)

## 2011-10-08 LAB — CBC WITH DIFFERENTIAL/PLATELET
Basophils Absolute: 0 10*3/uL (ref 0.0–0.1)
Basophils Relative: 0.5 % (ref 0.0–3.0)
Eosinophils Absolute: 0.1 10*3/uL (ref 0.0–0.7)
HCT: 39.8 % (ref 36.0–46.0)
Lymphs Abs: 1.1 10*3/uL (ref 0.7–4.0)
MCHC: 33.2 g/dL (ref 30.0–36.0)
MCV: 91.1 fl (ref 78.0–100.0)
Monocytes Absolute: 0.2 10*3/uL (ref 0.1–1.0)
Neutro Abs: 2.9 10*3/uL (ref 1.4–7.7)
Neutrophils Relative %: 67.8 % (ref 43.0–77.0)
RBC: 4.37 Mil/uL (ref 3.87–5.11)

## 2011-10-08 LAB — BASIC METABOLIC PANEL
CO2: 25 mEq/L (ref 19–32)
Chloride: 102 mEq/L (ref 96–112)
Creatinine, Ser: 0.8 mg/dL (ref 0.4–1.2)

## 2011-10-08 LAB — LIPID PANEL
LDL Cholesterol: 104 mg/dL — ABNORMAL HIGH (ref 0–99)
Total CHOL/HDL Ratio: 3
VLDL: 24.2 mg/dL (ref 0.0–40.0)

## 2011-10-08 LAB — TSH: TSH: 0.81 u[IU]/mL (ref 0.35–5.50)

## 2011-10-08 NOTE — Assessment & Plan Note (Signed)
S/p double mastectomy

## 2011-10-08 NOTE — Assessment & Plan Note (Signed)
Neg cards work up better since retired

## 2011-10-08 NOTE — Progress Notes (Signed)
  Subjective:    Patient ID: Nicole [name], female    DOB: 1946-03-13, 65 y.o.   MRN: [id number]  HPI Pt comes in for  medicaion evaluation No major change in health status since last visit .  Sees dr Gaynelle Cage    Had labs but needs esr .has had some upper back another  Dx tendinitis GYNE; sees Dr Tresa Res yearly CV sx gone  Only to see dr wall prn  LIPIDS: no se of meds due for labs Review of Systems Some back pain  No fever weight change  Bleeding  Reported utd on hcm    Past history family history social history reviewed in the electronic medical record. Family illness    Objective:   Physical Exam Physical Exam: Vital signs reviewed HYQ:MVHQ is a well-developed well-nourished alert cooperative  white female who appears her stated age in no acute distress.  HEENT: normocephalic  traumatic , Eyes: PERRL EOM's full, conjunctiva clear, Nares: paten,t no deformity discharge or tenderness., Ears: no deformity EAC's clear TMs with normal landmarks. NECK: supple without masses, thyromegaly or bruits. CHEST/PULM:  Clear to auscultation and percussion breath sounds equal no wheeze , rales or rhonchi. No chest wall deformities or tenderness. CV: PMI is nondisplaced, S1 S2 no gallops, murmurs, rubs. Peripheral pulses are full without delay.No JVD .  scoliosis gait normal ABDOMEN: Bowel sounds normal nontender  No guard or rebound, no hepato splenomegal no CVA tenderness.   Extremtities:  No clubbing cyanosis or edema, no acute joint swelling or redness no focal atrophy vv NEURO:  Oriented x3, cranial nerves 3-12 appear to be intact, no obvious focal weakness,gait within normal limits  SKIN: No acute rashes normal turgor, color, no bruising or petechiae. PSYCH: Oriented, good eye contact, no obvious depression anxiety, cognition and judgment appear normal.     Assessment & Plan:  Hyperlipidemia  No se of med check levels. Arthropathy seropositive / djd seen by rheum get esr today  . Preventive Health Care UTD  Colon  On 10 year recall, eye   Flu shot today Hx of breast cancer   Disc  Advisability of marker monitoring with dr Tresa Res  ( nothing in recent ehr)  Back pain and tendinitis under rx and eval Total visit > 50% spent counseling and coordinating care

## 2011-10-08 NOTE — Patient Instructions (Signed)
Will notify you  of labs when available. And send to dr Durenda Age  And dr Tresa Res  Have pharmacy call if need refill. See in  A year if doing well.

## 2011-10-09 LAB — SEDIMENTATION RATE: Sed Rate: 25 mm/hr — ABNORMAL HIGH (ref 0–22)

## 2011-10-10 ENCOUNTER — Encounter: Payer: Self-pay | Admitting: *Deleted

## 2011-10-10 NOTE — Progress Notes (Signed)
Quick Note:  Letter sent to pt and copy of labs sent to all specialist. ______

## 2011-11-20 ENCOUNTER — Other Ambulatory Visit: Payer: Self-pay | Admitting: Internal Medicine

## 2012-01-27 ENCOUNTER — Encounter: Payer: Self-pay | Admitting: Internal Medicine

## 2012-01-27 ENCOUNTER — Ambulatory Visit (INDEPENDENT_AMBULATORY_CARE_PROVIDER_SITE_OTHER): Payer: BC Managed Care – PPO | Admitting: Internal Medicine

## 2012-01-27 VITALS — BP 130/80 | HR 97 | Temp 98.5°F | Wt 140.0 lb

## 2012-01-27 DIAGNOSIS — J329 Chronic sinusitis, unspecified: Secondary | ICD-10-CM

## 2012-01-27 DIAGNOSIS — R059 Cough, unspecified: Secondary | ICD-10-CM

## 2012-01-27 DIAGNOSIS — J988 Other specified respiratory disorders: Secondary | ICD-10-CM

## 2012-01-27 DIAGNOSIS — R05 Cough: Secondary | ICD-10-CM

## 2012-01-27 DIAGNOSIS — J22 Unspecified acute lower respiratory infection: Secondary | ICD-10-CM

## 2012-01-27 MED ORDER — HYDROCODONE-HOMATROPINE 5-1.5 MG/5ML PO SYRP: 5.0000 mL | ORAL_SOLUTION | ORAL | Status: AC | PRN

## 2012-01-27 NOTE — Progress Notes (Signed)
  Subjective:    Patient ID: Nicole [name], female    DOB: 1946/01/22, 66 y.o.   MRN: [id number]  HPI Patient comes in today for SDA for  new problem evaluation.here with husband  Sore throat and spasmodic mostly dry  cough and using otc and pressure in face. Onset 3-4 days a go   Cough some this am yellow.  No blood . NO sob . Using otc without help for cough  Some sinus ache  And ha at times  Some nose congestion. Decrease appetite Low  Feels feverish.   Chills   For 2 nights.   Not last pm. Using moisturized vapor and gargles also  No wheezing ,sob.    Review of Systems No cp sob ? If wheeze at night hx  Of PNA as a child but no COPD . Ets.  Past history family history social history reviewed in the electronic medical record.     Objective:   Physical Exam WDWN in NAD  quiet respirations; mildly congested  somewhat hoarse. Non toxic . HEENT: Normocephalic ;atraumatic , Eyes;  PERRL, EOMs  Full, lids and conjunctiva clear,,Ears: no deformities, canals nl, TM landmarks normal, Nose: no deformity or discharge but congested;face minimally tender Mouth : OP clear without lesion or edema . Neck: Supple without adenopathy or masses or bruits points to voval chord are as area of discomfort.  Chest:  Clear to A&P without wheezes rales or rhonchi CV:  S1-S2 no gallops or murmurs peripheral perfusion is normal Skin :nl perfusion and no acute rashes      Assessment & Plan:  Acute viral resp infection     Counseled.  management .    Expectant management;symptom relief as needed. Contact wiith alarm features or if  persistent or progressive as discussed. HO given.  Can do Welcome to medicare visit  In 4-6 months  With lipid check labs at the visit  Total visit > 50% spent counseling and coordinating care

## 2012-01-27 NOTE — Patient Instructions (Addendum)
This is a viral respiratory infection that will improve on its own however your cough may last another week or 2. But it should be improving and get looser. Her lung exam is normal and your oxygen level is normal. Contact us if you have significant fever under 102 more short of breath in her chest. Or if you have worsening  or persistent sinus pain.  Continue the humidified warm air or gargles can use ibuprofen-type medicines for pain and decongestant.  Cough medicines sometimes help for comfort while you were healing.  Can use the hydrocodone at night for comfort.

## 2012-01-28 ENCOUNTER — Telehealth: Payer: Self-pay | Admitting: *Deleted

## 2012-01-28 DIAGNOSIS — R05 Cough: Secondary | ICD-10-CM | POA: Insufficient documentation

## 2012-01-28 DIAGNOSIS — J329 Chronic sinusitis, unspecified: Secondary | ICD-10-CM | POA: Insufficient documentation

## 2012-01-28 DIAGNOSIS — J22 Unspecified acute lower respiratory infection: Secondary | ICD-10-CM | POA: Insufficient documentation

## 2012-01-28 DIAGNOSIS — R059 Cough, unspecified: Secondary | ICD-10-CM | POA: Insufficient documentation

## 2012-01-28 MED ORDER — AMOXICILLIN-POT CLAVULANATE 875-125 MG PO TABS: 1.0000 | ORAL_TABLET | Freq: Two times a day (BID) | ORAL | Status: DC

## 2012-01-28 NOTE — Telephone Encounter (Signed)
Pt was told to call Dr Fabian Sharp with her progress.  Her temp is still between 100-101.  The worse symptom is the headache which is not being relieved by anything, and feels may need an antibiotic.Nicole [name]

## 2012-01-28 NOTE — Telephone Encounter (Signed)
Notified pt. 

## 2012-01-28 NOTE — Telephone Encounter (Signed)
Ok to begin sinusitis medication  augmentin 875  1 po bid for 10 days. i sent this in . Fever should be gone in 48 hours or so.

## 2012-02-02 ENCOUNTER — Other Ambulatory Visit: Payer: Self-pay | Admitting: Internal Medicine

## 2012-02-02 MED ORDER — AMOXICILLIN-POT CLAVULANATE 875-125 MG PO TABS: 1.0000 | ORAL_TABLET | Freq: Two times a day (BID) | ORAL | Status: AC

## 2012-02-02 NOTE — Telephone Encounter (Signed)
Spoke to pt- she only took 2 days worth when she started giving her husband some on sat. They both took one bid on the weekend. So she would like a refill to get the 10 days that they both need since her husband's onc. Never called them back. She said that she didn't know what else to do. So she done what she had to do.

## 2012-02-02 NOTE — Telephone Encounter (Signed)
Pt aware of this. 

## 2012-02-02 NOTE — Telephone Encounter (Signed)
Per Dr. Fabian Sharp can only do 10 pills.

## 2012-02-02 NOTE — Telephone Encounter (Signed)
Pt gave her hus some on her abx due to his fever. Requesting refill call into [facility] [phone]. Pt hus oncologist never call him back. ?uri.

## 2012-10-12 ENCOUNTER — Encounter: Payer: Self-pay | Admitting: Family Medicine

## 2012-10-12 ENCOUNTER — Ambulatory Visit (INDEPENDENT_AMBULATORY_CARE_PROVIDER_SITE_OTHER): Payer: Medicare Other | Admitting: Family Medicine

## 2012-10-12 VITALS — BP 100/78 | HR 85 | Temp 98.4°F | Wt 138.0 lb

## 2012-10-12 DIAGNOSIS — J329 Chronic sinusitis, unspecified: Secondary | ICD-10-CM

## 2012-10-12 MED ORDER — AMOXICILLIN-POT CLAVULANATE 875-125 MG PO TABS: 1.0000 | ORAL_TABLET | Freq: Two times a day (BID) | ORAL | Status: DC

## 2012-10-12 NOTE — Patient Instructions (Addendum)
INSTRUCTIONS FOR UPPER RESPIRATORY INFECTION:  -plenty of rest and fluids  -nasal saline wash 2-3 times daily (use prepackaged nasal saline or bottled/distilled water if making your own) [facility] Sinus Rinse  -can use sinex nasal spray for drainage and nasal congestion - but do NOT use longer then 3-4 days  -can use tylenol or ibuprofen as directed for aches and sorethroat  -in the winter time, using a humidifier at night is helpful (please follow cleaning instructions)  -if you are taking a cough medication - use only as directed, may also try a teaspoon of honey to coat the throat and throat lozenges  -for sore throat, salt water gargles can help  -follow up if you have fevers, are worsening or not getting better in 3-4 days

## 2012-10-12 NOTE — Progress Notes (Signed)
Chief Complaint  Patient presents with  . Sinusitis    facial pain, neck pain, headache, nausea, vertigo started yesterday     HPI: -started: last week, then worse yesterday, a little better today -symptoms:nasal congestion, sore throat, cough, sinus pain, R maxillary tooth pain, drainage -denies:fever, SOB, NVD, strep or mono exposure -has tried: [facility] OTC sinus medication, aspirin -sick contacts: none known -Hx of: AR, sinus infections, migraines - but this feels more like sinus   ROS: See pertinent positives and negatives per HPI.  Past Medical History  Diagnosis Date  . Hx of breast cancer   . Headache   . Rheumatoid factor positive     OA  . GERD (gastroesophageal reflux disease)     Family History  Problem Relation Age of Onset  . Arthritis      maternal side RA cousin  . Kidney disease      maternal side  . Diabetes      1st degree relative, paternal side    History   Social History  . Marital Status: Married    Spouse Name: N/A    Number of Children: N/A  . Years of Education: N/A   Social History Main Topics  . Smoking status: Former Games developer  . Smokeless tobacco: None  . Alcohol Use: Yes  . Drug Use: No  . Sexually Active: None   Other Topics Concern  . None   Social History Narrative   Married, 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 Designated Party release signed 04/17/10.    Current outpatient prescriptions:calcium carbonate (OS-CAL) 600 MG TABS, Take 600 mg by mouth 3 (three) times daily with meals.  , Disp: , Rfl: ;  ergocalciferol (VITAMIN D2) 50000 UNITS capsule, Take 50,000 Units by mouth every 14 (fourteen) days.  , Disp: , Rfl: ;  fish oil-omega-3 fatty acids 1000 MG capsule, Take 1 g by mouth daily. , Disp: , Rfl: ;  lovastatin (MEVACOR) 20 MG tablet, TAKE 1 TABLET BY MOUTH ONCE DAILY, Disp: 90 tablet, Rfl: 3 Magnesium 300 MG CAPS, Take 1 capsule by mouth daily. , Disp: , Rfl: ;  NON FORMULARY, Vit A and  Vit E and Vit B  1 tab po qd , Disp: , Rfl: ;  ranitidine (ZANTAC) 150 MG tablet, TAKE 1 TABLET BY MOUTH TWO TIMES A DAY, Disp: 180 tablet, Rfl: 3;  valACYclovir (VALTREX) 1000 MG tablet, Take 1,000 mg by mouth as needed. Or as directed., Disp: , Rfl:  amoxicillin-clavulanate (AUGMENTIN) 875-125 MG per tablet, Take 1 tablet by mouth 2 (two) times daily., Disp: 20 tablet, Rfl: 0  EXAM:  Filed Vitals:   10/12/12 1014  BP: 100/78  Pulse: 85  Temp: 98.4 F (36.9 C)    There is no height on file to calculate BMI.  GENERAL: vitals reviewed and listed above, alert, oriented, appears well hydrated and in no acute distress  HEENT: atraumatic, conjunttiva clear, no obvious abnormalities on inspection of external nose and ears, normal appearance of ear canals and TMs, clear nasal congestion, mild post oropharyngeal erythema with PND, no tonsillar edema or exudate, R max sinus TTP  NECK: no obvious masses on inspection  LUNGS: clear to auscultation bilaterally, no wheezes, rales or rhonchi, good air movement  CV: HRRR, no peripheral edema  MS: moves all extremities without noticeable abnormality  PSYCH: pleasant and cooperative, no obvious depression or anxiety  ASSESSMENT AND PLAN:  Discussed the following assessment and plan:  1.  Sinusitis  amoxicillin-clavulanate (AUGMENTIN) 875-125 MG per tablet   -likely sinusitis given symptoms, abx reasonable versus cautious monitoring, pt prefers abx. Risks discussed. Return precuations. -Patient advised to return or notify a doctor immediately if symptoms worsen or persist or new concerns arise.  Patient Instructions  INSTRUCTIONS FOR UPPER RESPIRATORY INFECTION:  -plenty of rest and fluids  -nasal saline wash 2-3 times daily (use prepackaged nasal saline or bottled/distilled water if making your own)   -can use sinex nasal spray for drainage and nasal congestion - but do NOT use longer then 3-4 days  -can use tylenol or ibuprofen as  directed for aches and sorethroat  -in the winter time, using a humidifier at night is helpful (please follow cleaning instructions)  -if you are taking a cough medication - use only as directed, may also try a teaspoon of honey to coat the throat and throat lozenges  -for sore throat, salt water gargles can help  -follow up if you have fevers, are worsening or not getting better in 3-4 days      Shaden Higley, Dahlia Client R.

## 2012-11-12 ENCOUNTER — Other Ambulatory Visit: Payer: Self-pay | Admitting: Internal Medicine

## 2012-12-30 ENCOUNTER — Other Ambulatory Visit (INDEPENDENT_AMBULATORY_CARE_PROVIDER_SITE_OTHER): Payer: Medicare Other

## 2012-12-30 DIAGNOSIS — E782 Mixed hyperlipidemia: Secondary | ICD-10-CM

## 2012-12-30 DIAGNOSIS — Z0000 Encounter for general adult medical examination without abnormal findings: Secondary | ICD-10-CM

## 2012-12-30 LAB — HEPATIC FUNCTION PANEL
Bilirubin, Direct: 0 mg/dL (ref 0.0–0.3)
Total Bilirubin: 0.6 mg/dL (ref 0.3–1.2)

## 2012-12-30 LAB — CBC WITH DIFFERENTIAL/PLATELET
Hemoglobin: 12.7 g/dL (ref 12.0–15.0)
Lymphocytes Relative: 24.5 % (ref 12.0–46.0)
Lymphs Abs: 1.1 10*3/uL (ref 0.7–4.0)
MCHC: 33.6 g/dL (ref 30.0–36.0)
Neutro Abs: 2.9 10*3/uL (ref 1.4–7.7)
Platelets: 238 10*3/uL (ref 150.0–400.0)
RDW: 13.6 % (ref 11.5–14.6)

## 2012-12-30 LAB — LIPID PANEL
HDL: 60.2 mg/dL (ref >39.00)
Total CHOL/HDL Ratio: 3
VLDL: 22.2 mg/dL (ref 0.0–40.0)

## 2012-12-30 LAB — BASIC METABOLIC PANEL
BUN: 16 mg/dL (ref 6–23)
CO2: 27 mEq/L (ref 19–32)
GFR: 77.51 mL/min (ref >60.00)
Glucose, Bld: 109 mg/dL — ABNORMAL HIGH (ref 70–99)
Potassium: 4.2 mEq/L (ref 3.5–5.1)
Sodium: 135 mEq/L (ref 135–145)

## 2012-12-30 LAB — TSH: TSH: 1.75 u[IU]/mL (ref 0.35–5.50)

## 2012-12-30 LAB — HIGH SENSITIVITY CRP: CRP, High Sensitivity: 1.36 mg/L (ref 0.000–5.000)

## 2012-12-30 LAB — LDL CHOLESTEROL, DIRECT: Direct LDL: 119.9 mg/dL

## 2013-01-10 ENCOUNTER — Encounter: Payer: Self-pay | Admitting: Internal Medicine

## 2013-01-10 ENCOUNTER — Ambulatory Visit (INDEPENDENT_AMBULATORY_CARE_PROVIDER_SITE_OTHER): Payer: Medicare Other | Admitting: Internal Medicine

## 2013-01-10 VITALS — BP 122/82 | HR 90 | Temp 98.6°F | Ht 62.5 in | Wt 139.0 lb

## 2013-01-10 DIAGNOSIS — E782 Mixed hyperlipidemia: Secondary | ICD-10-CM

## 2013-01-10 DIAGNOSIS — Z0000 Encounter for general adult medical examination without abnormal findings: Secondary | ICD-10-CM

## 2013-01-10 DIAGNOSIS — M4120 Other idiopathic scoliosis, site unspecified: Secondary | ICD-10-CM

## 2013-01-10 DIAGNOSIS — R894 Abnormal immunological findings in specimens from other organs, systems and tissues: Secondary | ICD-10-CM

## 2013-01-10 DIAGNOSIS — Z853 Personal history of malignant neoplasm of breast: Secondary | ICD-10-CM

## 2013-01-10 DIAGNOSIS — R768 Other specified abnormal immunological findings in serum: Secondary | ICD-10-CM

## 2013-01-10 DIAGNOSIS — Z23 Encounter for immunization: Secondary | ICD-10-CM

## 2013-01-10 NOTE — Progress Notes (Signed)
Chief Complaint  Patient presents with  . Annual Exam    HPI: Patient comes in today for Preventive Health Care visit  Welcome to medicare  LIPIDS taking medication no se noted asks  About risk benefit of these meds .  ? busitis in hip and tenonitis ins shoulder at time  With exercise   Hearing:  Ok   Vision:  No limitations at present . Last eye check UTD  Safety:  Has smoke detector and wears seat belts.  No firearms. No excess sun exposure. Sees dentist regularly.  Falls: none  Advance directive :  Reviewed  Has one.  Memory: Felt to be good  , no concern from her or her family.  Depression: No anhedonia unusual crying or depressive symptoms  Nutrition: Eats well balanced diet; adequate calcium and vitamin D. No swallowing chewing problems.  Injury: no major injuries in the last six months.  Other healthcare providers:  Reviewed today .  Social:  Lives with spouse married. No pets.   Preventive parameters: up-to-date  Reviewed   ADLS:   There are no problems or need for assistance  driving, feeding, obtaining food, dressing, toileting and bathing, managing money using phone. She is independent.  EXERCISE/ HABITS  Per week   2-3  Aerobic zumba No tobacco   No etoh  1-2 hs.    ROS:  GEN/ HEENT: No fever, significant weight changes sweats headaches vision problems hearing changes, CV/ PULM; No chest pain shortness of breath cough, syncope,edema  change in exercise tolerance. GI /GU: No adominal pain, vomiting, change in bowel habits. No blood in the stool. No significant GU symptoms. SKIN/HEME: ,no acute skin rashes suspicious lesions or bleeding. No lymphadenopathy, nodules, masses.  NEURO/ PSYCH:  No neurologic signs such as weakness numbness. No depression anxiety. IMM/ Allergy: No unusual infections.  Allergy .   REST of 12 system review negative except as per HPI   Past Medical History  Diagnosis Date  . Hx of breast cancer   . Headache   . Rheumatoid factor  positive     OA   had rheum consult  . GERD (gastroesophageal reflux disease)     Family History  Problem Relation Age of Onset  . Arthritis      maternal side RA cousin  . Kidney disease      maternal side  . Diabetes      1st degree relative, paternal side    History   Social History  . Marital Status: Married    Spouse Name: N/A    Number of Children: N/A  . Years of Education: N/A   Social History Main Topics  . Smoking status: Former Games developer  . Smokeless tobacco: None  . Alcohol Use: Yes  . Drug Use: No  . Sexually Active: None   Other Topics Concern  . None   Social History Narrative   Married, 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 Designated Party release signed 04/17/10.      Husband with final stage prostate cancer at this time.    Outpatient Encounter Prescriptions as of 01/10/2013  Medication Sig Dispense Refill  . acyclovir (ZOVIRAX) 200 MG capsule       . calcium carbonate (OS-CAL) 600 MG TABS Take 600 mg by mouth 3 (three) times daily with meals.        . ergocalciferol (VITAMIN D2) 50000 UNITS capsule Take 50,000 Units by mouth every 14 (fourteen) days.        Marland Kitchen  fish oil-omega-3 fatty acids 1000 MG capsule Take 1 g by mouth daily.       Marland Kitchen lovastatin (MEVACOR) 20 MG tablet TAKE 1 TABLET BY MOUTH ONCE DAILY  90 tablet  0  . Magnesium 300 MG CAPS Take 1 capsule by mouth daily.       . NON FORMULARY Vit A and Vit E and Vit B  1 tab po qd       . ranitidine (ZANTAC) 150 MG tablet TAKE 1 TABLET BY MOUTH TWO TIMES A DAY  180 tablet  3  . [DISCONTINUED] amoxicillin-clavulanate (AUGMENTIN) 875-125 MG per tablet Take 1 tablet by mouth 2 (two) times daily.  20 tablet  0  . [DISCONTINUED] valACYclovir (VALTREX) 1000 MG tablet Take 1,000 mg by mouth as needed. Or as directed.       No facility-administered encounter medications on file as of 01/10/2013.    EXAM:  BP 122/82  Pulse 90  Temp(Src) 98.6 F (37 C) (Oral)  Ht  5' 2.5" (1.588 m)  Wt 139 lb (63.05 kg)  BMI 25 kg/m2  SpO2 98%  Body mass index is 25 kg/(m^2).  Physical Exam: Vital signs reviewed ZOX:WRUE is a well-developed well-nourished alert cooperative   female who appears her stated age in no acute distress.  HEENT: normocephalic atraumatic , Eyes: PERRL EOM's full, conjunctiva clear, Nares: paten,t no deformity discharge or tenderness., Ears: no deformity EAC's clear TMs with normal landmarks. Mouth: clear OP, no lesions, edema.  Moist mucous membranes. Dentition in adequate repair. NECK: supple without masses, thyromegaly or bruits. CHEST/PULM:  Clear to auscultation and percussion breath sounds equal no wheeze , rales or rhonchi. No chest wall deformities or tenderness. Breast absent  CV: PMI is nondisplaced, S1 S2 no gallops, murmurs, rubs. Peripheral pulses are full without delay.No JVD .  ABDOMEN: Bowel sounds normal nontender  No guard or rebound, no hepato splenomegal no CVA tenderness.  No hernia. Extremtities:  No clubbing cyanosis or edema, no acute joint swelling or redness no focal atrophy NEURO:  Oriented x3, cranial nerves 3-12 appear to be intact, no obvious focal weakness,gait within normal limits no abnormal reflexes or asymmetrical SKIN: No acute rashes normal turgor, color, no bruising or petechiae. PSYCH: Oriented, good eye contact, no obvious depression anxiety, cognition and judgment appear normal. LN: no cervical axillary inguinal adenopathy Oriented x 3 and no noted deficits in memory, attention, and speech.   Lab Results  Component Value Date   WBC 4.7 12/30/2012   HGB 12.7 12/30/2012   HCT 37.8 12/30/2012   PLT 238.0 12/30/2012   GLUCOSE 109* 12/30/2012   CHOL 202* 12/30/2012   TRIG 111.0 12/30/2012   HDL 60.20 12/30/2012   LDLDIRECT 119.9 12/30/2012   LDLCALC 104* 10/08/2011   ALT 20 12/30/2012   AST 23 12/30/2012   NA 135 12/30/2012   K 4.2 12/30/2012   CL 101 12/30/2012   CREATININE 0.8 12/30/2012   BUN 16 12/30/2012   CO2 27  12/30/2012   TSH 1.75 12/30/2012    ASSESSMENT AND PLAN:  Discussed the following assessment and plan:  Visit for preventive health examination - counseled  prevention  Need for prophylactic vaccination against Streptococcus pneumoniae (pneumococcus) - disc  advise prevnar  13  - Plan: Pneumococcal conjugate vaccine 13-valent less than 5yo IM  SCOLIOSIS  Rheumatoid factor positive - no sx of inflammatory arthritis and had neg eva by rheu  in past  suggest see rheum if needed no need to repeat  labs   Personal history of malignant neoplasm of breast - hx of radiation and chemo had neg muga scanper hx   HYPERLIPIDEMIA - no se of meds good reading now disc new consensus statements Counseled regarding healthy nutrition, exercise, sleep, injury prevention, calcium vit d and healthy weight .  Patient Care Team: Madelin Headings, MD as PCP - General Susy Frizzle, MD (Rheumatology) Alison Murray, MD (Obstetrics and Gynecology) Florencia Reasons, MD (Gastroenterology) Patient Instructions  Check  Into shingles and tdap vaccine .    Can come back for this at any time .   usually we give pneumonia  vaccine  At age 56   There are 2 typeds  prevnar 13 and pneumovax  In 24 . giving  13 today   get your bone density . Through Dr Tresa Res .  getus a copy   Preventive Care for Adults, Female A healthy lifestyle and preventive care can promote health and wellness. Preventive health guidelines for women include the following key practices.  A routine yearly physical is a good way to check with your caregiver about your health and preventive screening. It is a chance to share any concerns and updates on your health, and to receive a thorough exam.  Visit your dentist for a routine exam and preventive care every 6 months. Brush your teeth twice a day and floss once a day. Good oral hygiene prevents tooth decay and gum disease.  The frequency of eye exams is based on your age, health, family  medical history, use of contact lenses, and other factors. Follow your caregiver's recommendations for frequency of eye exams.  Eat a healthy diet. Foods like vegetables, fruits, whole grains, low-fat dairy products, and lean protein foods contain the nutrients you need without too many calories. Decrease your intake of foods high in solid fats, added sugars, and salt. Eat the right amount of calories for you.Get information about a proper diet from your caregiver, if necessary.  Regular physical exercise is one of the most important things you can do for your health. Most adults should get at least 150 minutes of moderate-intensity exercise (any activity that increases your heart rate and causes you to sweat) each week. In addition, most adults need muscle-strengthening exercises on 2 or more days a week.  Maintain a healthy weight. The body mass index (BMI) is a screening tool to identify possible weight problems. It provides an estimate of body fat based on height and weight. Your caregiver can help determine your BMI, and can help you achieve or maintain a healthy weight.For adults 20 years and older:  A BMI below 18.5 is considered underweight.  A BMI of 18.5 to 24.9 is normal.  A BMI of 25 to 29.9 is considered overweight.  A BMI of 30 and above is considered obese.  Maintain normal blood lipids and cholesterol levels by exercising and minimizing your intake of saturated fat. Eat a balanced diet with plenty of fruit and vegetables. Blood tests for lipids and cholesterol should begin at age 51 and be repeated every 5 years. If your lipid or cholesterol levels are high, you are over 50, or you are at high risk for heart disease, you may need your cholesterol levels checked more frequently.Ongoing high lipid and cholesterol levels should be treated with medicines if diet and exercise are not effective.  If you smoke, find out from your caregiver how to quit. If you do not use tobacco, do not  start.  If you are pregnant, do not drink alcohol. If you are breastfeeding, be very cautious about drinking alcohol. If you are not pregnant and choose to drink alcohol, do not exceed 1 drink per day. One drink is considered to be 12 ounces (355 mL) of beer, 5 ounces (148 mL) of wine, or 1.5 ounces (44 mL) of liquor.  Avoid use of street drugs. Do not share needles with anyone. Ask for help if you need support or instructions about stopping the use of drugs.  High blood pressure causes heart disease and increases the risk of stroke. Your blood pressure should be checked at least every 1 to 2 years. Ongoing high blood pressure should be treated with medicines if weight loss and exercise are not effective.  If you are 21 to 66 years old, ask your caregiver if you should take aspirin to prevent strokes.  Diabetes screening involves taking a blood sample to check your fasting blood sugar level. This should be done once every 3 years, after age 74, if you are within normal weight and without risk factors for diabetes. Testing should be considered at a younger age or be carried out more frequently if you are overweight and have at least 1 risk factor for diabetes.  Breast cancer screening is essential preventive care for women. You should practice "breast self-awareness." This means understanding the normal appearance and feel of your breasts and may include breast self-examination. Any changes detected, no matter how small, should be reported to a caregiver. Women in their 88s and 30s should have a clinical breast exam (CBE) by a caregiver as part of a regular health exam every 1 to 3 years. After age 33, women should have a CBE every year. Starting at age 67, women should consider having a mammography (breast X-ray test) every year. Women who have a family history of breast cancer should talk to their caregiver about genetic screening. Women at a high risk of breast cancer should talk to their caregivers  about having magnetic resonance imaging (MRI) and a mammography every year.  The Pap test is a screening test for cervical cancer. A Pap test can show cell changes on the cervix that might become cervical cancer if left untreated. A Pap test is a procedure in which cells are obtained and examined from the lower end of the uterus (cervix).  Women should have a Pap test starting at age 42.  Between ages 36 and 43, Pap tests should be repeated every 2 years.  Beginning at age 86, you should have a Pap test every 3 years as long as the past 3 Pap tests have been normal.  Some women have medical problems that increase the chance of getting cervical cancer. Talk to your caregiver about these problems. It is especially important to talk to your caregiver if a new problem develops soon after your last Pap test. In these cases, your caregiver may recommend more frequent screening and Pap tests.  The above recommendations are the same for women who have or have not gotten the vaccine for human papillomavirus (HPV).  If you had a hysterectomy for a problem that was not cancer or a condition that could lead to cancer, then you no longer need Pap tests. Even if you no longer need a Pap test, a regular exam is a good idea to make sure no other problems are starting.  If you are between ages 85 and 5, and you have had normal Pap tests going back 10  years, you no longer need Pap tests. Even if you no longer need a Pap test, a regular exam is a good idea to make sure no other problems are starting.  If you have had past treatment for cervical cancer or a condition that could lead to cancer, you need Pap tests and screening for cancer for at least 20 years after your treatment.  If Pap tests have been discontinued, risk factors (such as a new sexual partner) need to be reassessed to determine if screening should be resumed.  The HPV test is an additional test that may be used for cervical cancer screening. The  HPV test looks for the virus that can cause the cell changes on the cervix. The cells collected during the Pap test can be tested for HPV. The HPV test could be used to screen women aged 61 years and older, and should be used in women of any age who have unclear Pap test results. After the age of 23, women should have HPV testing at the same frequency as a Pap test.  Colorectal cancer can be detected and often prevented. Most routine colorectal cancer screening begins at the age of 16 and continues through age 70. However, your caregiver may recommend screening at an earlier age if you have risk factors for colon cancer. On a yearly basis, your caregiver may provide home test kits to check for hidden blood in the stool. Use of a small camera at the end of a tube, to directly examine the colon (sigmoidoscopy or colonoscopy), can detect the earliest forms of colorectal cancer. Talk to your caregiver about this at age 74, when routine screening begins. Direct examination of the colon should be repeated every 5 to 10 years through age 77, unless early forms of pre-cancerous polyps or small growths are found.  Hepatitis C blood testing is recommended for all people born from [birth year] through 1965 and any individual with known risks for hepatitis C.  Practice safe sex. Use condoms and avoid high-risk sexual practices to reduce the spread of sexually transmitted infections (STIs). STIs include gonorrhea, chlamydia, syphilis, trichomonas, herpes, HPV, and human immunodeficiency virus (HIV). Herpes, HIV, and HPV are viral illnesses that have no cure. They can result in disability, cancer, and death. Sexually active women aged 70 and younger should be checked for chlamydia. Older women with new or multiple partners should also be tested for chlamydia. Testing for other STIs is recommended if you are sexually active and at increased risk.  Osteoporosis is a disease in which the bones lose minerals and strength with aging.  This can result in serious bone fractures. The risk of osteoporosis can be identified using a bone density scan. Women ages 40 and over and women at risk for fractures or osteoporosis should discuss screening with their caregivers. Ask your caregiver whether you should take a calcium supplement or vitamin D to reduce the rate of osteoporosis.  Menopause can be associated with physical symptoms and risks. Hormone replacement therapy is available to decrease symptoms and risks. You should talk to your caregiver about whether hormone replacement therapy is right for you.  Use sunscreen with sun protection factor (SPF) of 30 or more. Apply sunscreen liberally and repeatedly throughout the day. You should seek shade when your shadow is shorter than you. Protect yourself by wearing long sleeves, pants, a wide-brimmed hat, and sunglasses year round, whenever you are outdoors.  Once a month, do a whole body skin exam, using a mirror to look  at the skin on your back. Notify your caregiver of new moles, moles that have irregular borders, moles that are larger than a pencil eraser, or moles that have changed in shape or color.  Stay current with required immunizations.  Influenza. You need a dose every fall (or winter). The composition of the flu vaccine changes each year, so being vaccinated once is not enough.  Pneumococcal polysaccharide. You need 1 to 2 doses if you smoke cigarettes or if you have certain chronic medical conditions. You need 1 dose at age 108 (or older) if you have never been vaccinated.  Tetanus, diphtheria, pertussis (Tdap, Td). Get 1 dose of Tdap vaccine if you are younger than age 62, are over 74 and have contact with an infant, are a Research scientist (physical sciences), are pregnant, or simply want to be protected from whooping cough. After that, you need a Td booster dose every 10 years. Consult your caregiver if you have not had at least 3 tetanus and diphtheria-containing shots sometime in your life or  have a deep or dirty wound.  HPV. You need this vaccine if you are a woman age 19 or younger. The vaccine is given in 3 doses over 6 months.  Measles, mumps, rubella (MMR). You need at least 1 dose of MMR if you were born in 1957 or later. You may also need a second dose.  Meningococcal. If you are age 28 to 31 and a first-year college student living in a residence hall, or have one of several medical conditions, you need to get vaccinated against meningococcal disease. You may also need additional booster doses.  Zoster (shingles). If you are age 37 or older, you should get this vaccine.  Varicella (chickenpox). If you have never had chickenpox or you were vaccinated but received only 1 dose, talk to your caregiver to find out if you need this vaccine.  Hepatitis A. You need this vaccine if you have a specific risk factor for hepatitis A virus infection or you simply wish to be protected from this disease. The vaccine is usually given as 2 doses, 6 to 18 months apart.  Hepatitis B. You need this vaccine if you have a specific risk factor for hepatitis B virus infection or you simply wish to be protected from this disease. The vaccine is given in 3 doses, usually over 6 months. Preventive Services / Frequency Ages 88 to 68  Blood pressure check.** / Every 1 to 2 years.  Lipid and cholesterol check.** / Every 5 years beginning at age 64.  Clinical breast exam.** / Every 3 years for women in their 13s and 30s.  Pap test.** / Every 2 years from ages 61 through 2. Every 3 years starting at age 50 through age 38 or 63 with a history of 3 consecutive normal Pap tests.  HPV screening.** / Every 3 years from ages 80 through ages 74 to 49 with a history of 3 consecutive normal Pap tests.  Hepatitis C blood test.** / For any individual with known risks for hepatitis C.  Skin self-exam. / Monthly.  Influenza immunization.** / Every year.  Pneumococcal polysaccharide immunization.** / 1 to 2  doses if you smoke cigarettes or if you have certain chronic medical conditions.  Tetanus, diphtheria, pertussis (Tdap, Td) immunization. / A one-time dose of Tdap vaccine. After that, you need a Td booster dose every 10 years.  HPV immunization. / 3 doses over 6 months, if you are 73 and younger.  Measles, mumps, rubella (MMR)  immunization. / You need at least 1 dose of MMR if you were born in 1957 or later. You may also need a second dose.  Meningococcal immunization. / 1 dose if you are age 76 to 69 and a first-year college student living in a residence hall, or have one of several medical conditions, you need to get vaccinated against meningococcal disease. You may also need additional booster doses.  Varicella immunization.** / Consult your caregiver.  Hepatitis A immunization.** / Consult your caregiver. 2 doses, 6 to 18 months apart.  Hepatitis B immunization.** / Consult your caregiver. 3 doses usually over 6 months. Ages 63 to 55  Blood pressure check.** / Every 1 to 2 years.  Lipid and cholesterol check.** / Every 5 years beginning at age 71.  Clinical breast exam.** / Every year after age 45.  Mammogram.** / Every year beginning at age 63 and continuing for as long as you are in good health. Consult with your caregiver.  Pap test.** / Every 3 years starting at age 49 through age 76 or 53 with a history of 3 consecutive normal Pap tests.  HPV screening.** / Every 3 years from ages 7 through ages 44 to 74 with a history of 3 consecutive normal Pap tests.  Fecal occult blood test (FOBT) of stool. / Every year beginning at age 22 and continuing until age 12. You may not need to do this test if you get a colonoscopy every 10 years.  Flexible sigmoidoscopy or colonoscopy.** / Every 5 years for a flexible sigmoidoscopy or every 10 years for a colonoscopy beginning at age 8 and continuing until age 1.  Hepatitis C blood test.** / For all people born from [birth year] through 1964 and  any individual with known risks for hepatitis C.  Skin self-exam. / Monthly.  Influenza immunization.** / Every year.  Pneumococcal polysaccharide immunization.** / 1 to 2 doses if you smoke cigarettes or if you have certain chronic medical conditions.  Tetanus, diphtheria, pertussis (Tdap, Td) immunization.** / A one-time dose of Tdap vaccine. After that, you need a Td booster dose every 10 years.  Measles, mumps, rubella (MMR) immunization. / You need at least 1 dose of MMR if you were born in 1957 or later. You may also need a second dose.  Varicella immunization.** / Consult your caregiver.  Meningococcal immunization.** / Consult your caregiver.  Hepatitis A immunization.** / Consult your caregiver. 2 doses, 6 to 18 months apart.  Hepatitis B immunization.** / Consult your caregiver. 3 doses, usually over 6 months. Ages 68 and over  Blood pressure check.** / Every 1 to 2 years.  Lipid and cholesterol check.** / Every 5 years beginning at age 76.  Clinical breast exam.** / Every year after age 57.  Mammogram.** / Every year beginning at age 45 and continuing for as long as you are in good health. Consult with your caregiver.  Pap test.** / Every 3 years starting at age 39 through age 77 or 80 with a 3 consecutive normal Pap tests. Testing can be stopped between 65 and 70 with 3 consecutive normal Pap tests and no abnormal Pap or HPV tests in the past 10 years.  HPV screening.** / Every 3 years from ages 67 through ages 73 or 65 with a history of 3 consecutive normal Pap tests. Testing can be stopped between 65 and 70 with 3 consecutive normal Pap tests and no abnormal Pap or HPV tests in the past 10 years.  Fecal occult blood test (  FOBT) of stool. / Every year beginning at age 48 and continuing until age 50. You may not need to do this test if you get a colonoscopy every 10 years.  Flexible sigmoidoscopy or colonoscopy.** / Every 5 years for a flexible sigmoidoscopy or every 10  years for a colonoscopy beginning at age 75 and continuing until age 74.  Hepatitis C blood test.** / For all people born from [birth year] through 1964 and any individual with known risks for hepatitis C.  Osteoporosis screening.** / A one-time screening for women ages 68 and over and women at risk for fractures or osteoporosis.  Skin self-exam. / Monthly.  Influenza immunization.** / Every year.  Pneumococcal polysaccharide immunization.** / 1 dose at age 51 (or older) if you have never been vaccinated.  Tetanus, diphtheria, pertussis (Tdap, Td) immunization. / A one-time dose of Tdap vaccine if you are over 65 and have contact with an infant, are a Research scientist (physical sciences), or simply want to be protected from whooping cough. After that, you need a Td booster dose every 10 years.  Varicella immunization.** / Consult your caregiver.  Meningococcal immunization.** / Consult your caregiver.  Hepatitis A immunization.** / Consult your caregiver. 2 doses, 6 to 18 months apart.  Hepatitis B immunization.** / Check with your caregiver. 3 doses, usually over 6 months. ** Family history and personal history of risk and conditions may change your caregiver's recommendations. Document Released: 01/06/2002 Document Revised: 02/02/2012 Document Reviewed: 04/07/2011 [facility] Patient Information 2013 [place], [place].      Neta Mends. [name] M.D.  Health Maintenance  Topic Date Due  . Zostavax  07/01/2007  . Pneumococcal Polysaccharide Vaccine Age 80 And Over  06/30/2012  . Influenza Vaccine  07/25/2012  . Tetanus/tdap  11/24/2012  . Colonoscopy  06/18/2021   Health Maintenance Review

## 2013-01-10 NOTE — Patient Instructions (Addendum)
Check  Into shingles and tdap vaccine .    Can come back for this at any time .   usually we give pneumonia  vaccine  At age 67   There are 2 typeds  prevnar 13 and pneumovax  In 23 . giving  13 today   get your bone density . Through Dr Tresa Res .  getus a copy   Preventive Care for Adults, Female A healthy lifestyle and preventive care can promote health and wellness. Preventive health guidelines for women include the following key practices.  A routine yearly physical is a good way to check with your caregiver about your health and preventive screening. It is a chance to share any concerns and updates on your health, and to receive a thorough exam.  Visit your dentist for a routine exam and preventive care every 6 months. Brush your teeth twice a day and floss once a day. Good oral hygiene prevents tooth decay and gum disease.  The frequency of eye exams is based on your age, health, family medical history, use of contact lenses, and other factors. Follow your caregiver's recommendations for frequency of eye exams.  Eat a healthy diet. Foods like vegetables, fruits, whole grains, low-fat dairy products, and lean protein foods contain the nutrients you need without too many calories. Decrease your intake of foods high in solid fats, added sugars, and salt. Eat the right amount of calories for you.Get information about a proper diet from your caregiver, if necessary.  Regular physical exercise is one of the most important things you can do for your health. Most adults should get at least 150 minutes of moderate-intensity exercise (any activity that increases your heart rate and causes you to sweat) each week. In addition, most adults need muscle-strengthening exercises on 2 or more days a week.  Maintain a healthy weight. The body mass index (BMI) is a screening tool to identify possible weight problems. It provides an estimate of body fat based on height and weight. Your caregiver can help  determine your BMI, and can help you achieve or maintain a healthy weight.For adults 20 years and older:  A BMI below 18.5 is considered underweight.  A BMI of 18.5 to 24.9 is normal.  A BMI of 25 to 29.9 is considered overweight.  A BMI of 30 and above is considered obese.  Maintain normal blood lipids and cholesterol levels by exercising and minimizing your intake of saturated fat. Eat a balanced diet with plenty of fruit and vegetables. Blood tests for lipids and cholesterol should begin at age 39 and be repeated every 5 years. If your lipid or cholesterol levels are high, you are over 50, or you are at high risk for heart disease, you may need your cholesterol levels checked more frequently.Ongoing high lipid and cholesterol levels should be treated with medicines if diet and exercise are not effective.  If you smoke, find out from your caregiver how to quit. If you do not use tobacco, do not start.  If you are pregnant, do not drink alcohol. If you are breastfeeding, be very cautious about drinking alcohol. If you are not pregnant and choose to drink alcohol, do not exceed 1 drink per day. One drink is considered to be 12 ounces (355 mL) of beer, 5 ounces (148 mL) of wine, or 1.5 ounces (44 mL) of liquor.  Avoid use of street drugs. Do not share needles with anyone. Ask for help if you need support or instructions about stopping the  use of drugs.  High blood pressure causes heart disease and increases the risk of stroke. Your blood pressure should be checked at least every 1 to 2 years. Ongoing high blood pressure should be treated with medicines if weight loss and exercise are not effective.  If you are 67 to 66 years old, ask your caregiver if you should take aspirin to prevent strokes.  Diabetes screening involves taking a blood sample to check your fasting blood sugar level. This should be done once every 3 years, after age 64, if you are within normal weight and without risk factors  for diabetes. Testing should be considered at a younger age or be carried out more frequently if you are overweight and have at least 1 risk factor for diabetes.  Breast cancer screening is essential preventive care for women. You should practice "breast self-awareness." This means understanding the normal appearance and feel of your breasts and may include breast self-examination. Any changes detected, no matter how small, should be reported to a caregiver. Women in their 49s and 30s should have a clinical breast exam (CBE) by a caregiver as part of a regular health exam every 1 to 3 years. After age 56, women should have a CBE every year. Starting at age 80, women should consider having a mammography (breast X-ray test) every year. Women who have a family history of breast cancer should talk to their caregiver about genetic screening. Women at a high risk of breast cancer should talk to their caregivers about having magnetic resonance imaging (MRI) and a mammography every year.  The Pap test is a screening test for cervical cancer. A Pap test can show cell changes on the cervix that might become cervical cancer if left untreated. A Pap test is a procedure in which cells are obtained and examined from the lower end of the uterus (cervix).  Women should have a Pap test starting at age 5.  Between ages 71 and 12, Pap tests should be repeated every 2 years.  Beginning at age 65, you should have a Pap test every 3 years as long as the past 3 Pap tests have been normal.  Some women have medical problems that increase the chance of getting cervical cancer. Talk to your caregiver about these problems. It is especially important to talk to your caregiver if a new problem develops soon after your last Pap test. In these cases, your caregiver may recommend more frequent screening and Pap tests.  The above recommendations are the same for women who have or have not gotten the vaccine for human papillomavirus  (HPV).  If you had a hysterectomy for a problem that was not cancer or a condition that could lead to cancer, then you no longer need Pap tests. Even if you no longer need a Pap test, a regular exam is a good idea to make sure no other problems are starting.  If you are between ages 30 and 39, and you have had normal Pap tests going back 10 years, you no longer need Pap tests. Even if you no longer need a Pap test, a regular exam is a good idea to make sure no other problems are starting.  If you have had past treatment for cervical cancer or a condition that could lead to cancer, you need Pap tests and screening for cancer for at least 20 years after your treatment.  If Pap tests have been discontinued, risk factors (such as a new sexual partner) need to be  reassessed to determine if screening should be resumed.  The HPV test is an additional test that may be used for cervical cancer screening. The HPV test looks for the virus that can cause the cell changes on the cervix. The cells collected during the Pap test can be tested for HPV. The HPV test could be used to screen women aged 39 years and older, and should be used in women of any age who have unclear Pap test results. After the age of 37, women should have HPV testing at the same frequency as a Pap test.  Colorectal cancer can be detected and often prevented. Most routine colorectal cancer screening begins at the age of 35 and continues through age 43. However, your caregiver may recommend screening at an earlier age if you have risk factors for colon cancer. On a yearly basis, your caregiver may provide home test kits to check for hidden blood in the stool. Use of a small camera at the end of a tube, to directly examine the colon (sigmoidoscopy or colonoscopy), can detect the earliest forms of colorectal cancer. Talk to your caregiver about this at age 46, when routine screening begins. Direct examination of the colon should be repeated every 5  to 10 years through age 36, unless early forms of pre-cancerous polyps or small growths are found.  Hepatitis C blood testing is recommended for all people born from [birth year] through 1965 and any individual with known risks for hepatitis C.  Practice safe sex. Use condoms and avoid high-risk sexual practices to reduce the spread of sexually transmitted infections (STIs). STIs include gonorrhea, chlamydia, syphilis, trichomonas, herpes, HPV, and human immunodeficiency virus (HIV). Herpes, HIV, and HPV are viral illnesses that have no cure. They can result in disability, cancer, and death. Sexually active women aged 62 and younger should be checked for chlamydia. Older women with new or multiple partners should also be tested for chlamydia. Testing for other STIs is recommended if you are sexually active and at increased risk.  Osteoporosis is a disease in which the bones lose minerals and strength with aging. This can result in serious bone fractures. The risk of osteoporosis can be identified using a bone density scan. Women ages 73 and over and women at risk for fractures or osteoporosis should discuss screening with their caregivers. Ask your caregiver whether you should take a calcium supplement or vitamin D to reduce the rate of osteoporosis.  Menopause can be associated with physical symptoms and risks. Hormone replacement therapy is available to decrease symptoms and risks. You should talk to your caregiver about whether hormone replacement therapy is right for you.  Use sunscreen with sun protection factor (SPF) of 30 or more. Apply sunscreen liberally and repeatedly throughout the day. You should seek shade when your shadow is shorter than you. Protect yourself by wearing long sleeves, pants, a wide-brimmed hat, and sunglasses year round, whenever you are outdoors.  Once a month, do a whole body skin exam, using a mirror to look at the skin on your back. Notify your caregiver of new moles, moles that  have irregular borders, moles that are larger than a pencil eraser, or moles that have changed in shape or color.  Stay current with required immunizations.  Influenza. You need a dose every fall (or winter). The composition of the flu vaccine changes each year, so being vaccinated once is not enough.  Pneumococcal polysaccharide. You need 1 to 2 doses if you smoke cigarettes or if you have  certain chronic medical conditions. You need 1 dose at age 68 (or older) if you have never been vaccinated.  Tetanus, diphtheria, pertussis (Tdap, Td). Get 1 dose of Tdap vaccine if you are younger than age 31, are over 19 and have contact with an infant, are a Research scientist (physical sciences), are pregnant, or simply want to be protected from whooping cough. After that, you need a Td booster dose every 10 years. Consult your caregiver if you have not had at least 3 tetanus and diphtheria-containing shots sometime in your life or have a deep or dirty wound.  HPV. You need this vaccine if you are a woman age 70 or younger. The vaccine is given in 3 doses over 6 months.  Measles, mumps, rubella (MMR). You need at least 1 dose of MMR if you were born in 1957 or later. You may also need a second dose.  Meningococcal. If you are age 53 to 11 and a first-year college student living in a residence hall, or have one of several medical conditions, you need to get vaccinated against meningococcal disease. You may also need additional booster doses.  Zoster (shingles). If you are age 20 or older, you should get this vaccine.  Varicella (chickenpox). If you have never had chickenpox or you were vaccinated but received only 1 dose, talk to your caregiver to find out if you need this vaccine.  Hepatitis A. You need this vaccine if you have a specific risk factor for hepatitis A virus infection or you simply wish to be protected from this disease. The vaccine is usually given as 2 doses, 6 to 18 months apart.  Hepatitis B. You need this  vaccine if you have a specific risk factor for hepatitis B virus infection or you simply wish to be protected from this disease. The vaccine is given in 3 doses, usually over 6 months. Preventive Services / Frequency Ages 74 to 18  Blood pressure check.** / Every 1 to 2 years.  Lipid and cholesterol check.** / Every 5 years beginning at age 59.  Clinical breast exam.** / Every 3 years for women in their 39s and 30s.  Pap test.** / Every 2 years from ages 29 through 55. Every 3 years starting at age 14 through age 52 or 21 with a history of 3 consecutive normal Pap tests.  HPV screening.** / Every 3 years from ages 35 through ages 29 to 78 with a history of 3 consecutive normal Pap tests.  Hepatitis C blood test.** / For any individual with known risks for hepatitis C.  Skin self-exam. / Monthly.  Influenza immunization.** / Every year.  Pneumococcal polysaccharide immunization.** / 1 to 2 doses if you smoke cigarettes or if you have certain chronic medical conditions.  Tetanus, diphtheria, pertussis (Tdap, Td) immunization. / A one-time dose of Tdap vaccine. After that, you need a Td booster dose every 10 years.  HPV immunization. / 3 doses over 6 months, if you are 58 and younger.  Measles, mumps, rubella (MMR) immunization. / You need at least 1 dose of MMR if you were born in 1957 or later. You may also need a second dose.  Meningococcal immunization. / 1 dose if you are age 57 to 69 and a first-year college student living in a residence hall, or have one of several medical conditions, you need to get vaccinated against meningococcal disease. You may also need additional booster doses.  Varicella immunization.** / Consult your caregiver.  Hepatitis A immunization.** / Consult your  caregiver. 2 doses, 6 to 18 months apart.  Hepatitis B immunization.** / Consult your caregiver. 3 doses usually over 6 months. Ages 30 to 82  Blood pressure check.** / Every 1 to 2 years.  Lipid  and cholesterol check.** / Every 5 years beginning at age 102.  Clinical breast exam.** / Every year after age 24.  Mammogram.** / Every year beginning at age 76 and continuing for as long as you are in good health. Consult with your caregiver.  Pap test.** / Every 3 years starting at age 76 through age 46 or 71 with a history of 3 consecutive normal Pap tests.  HPV screening.** / Every 3 years from ages 68 through ages 44 to 59 with a history of 3 consecutive normal Pap tests.  Fecal occult blood test (FOBT) of stool. / Every year beginning at age 4 and continuing until age 71. You may not need to do this test if you get a colonoscopy every 10 years.  Flexible sigmoidoscopy or colonoscopy.** / Every 5 years for a flexible sigmoidoscopy or every 10 years for a colonoscopy beginning at age 58 and continuing until age 47.  Hepatitis C blood test.** / For all people born from [birth year] through 1964 and any individual with known risks for hepatitis C.  Skin self-exam. / Monthly.  Influenza immunization.** / Every year.  Pneumococcal polysaccharide immunization.** / 1 to 2 doses if you smoke cigarettes or if you have certain chronic medical conditions.  Tetanus, diphtheria, pertussis (Tdap, Td) immunization.** / A one-time dose of Tdap vaccine. After that, you need a Td booster dose every 10 years.  Measles, mumps, rubella (MMR) immunization. / You need at least 1 dose of MMR if you were born in 1957 or later. You may also need a second dose.  Varicella immunization.** / Consult your caregiver.  Meningococcal immunization.** / Consult your caregiver.  Hepatitis A immunization.** / Consult your caregiver. 2 doses, 6 to 18 months apart.  Hepatitis B immunization.** / Consult your caregiver. 3 doses, usually over 6 months. Ages 72 and over  Blood pressure check.** / Every 1 to 2 years.  Lipid and cholesterol check.** / Every 5 years beginning at age 27.  Clinical breast exam.** / Every year  after age 23.  Mammogram.** / Every year beginning at age 34 and continuing for as long as you are in good health. Consult with your caregiver.  Pap test.** / Every 3 years starting at age 44 through age 70 or 17 with a 3 consecutive normal Pap tests. Testing can be stopped between 65 and 70 with 3 consecutive normal Pap tests and no abnormal Pap or HPV tests in the past 10 years.  HPV screening.** / Every 3 years from ages 56 through ages 25 or 46 with a history of 3 consecutive normal Pap tests. Testing can be stopped between 65 and 70 with 3 consecutive normal Pap tests and no abnormal Pap or HPV tests in the past 10 years.  Fecal occult blood test (FOBT) of stool. / Every year beginning at age 52 and continuing until age 46. You may not need to do this test if you get a colonoscopy every 10 years.  Flexible sigmoidoscopy or colonoscopy.** / Every 5 years for a flexible sigmoidoscopy or every 10 years for a colonoscopy beginning at age 23 and continuing until age 58.  Hepatitis C blood test.** / For all people born from [birth year] through 1964 and any individual with known risks for hepatitis C.  Osteoporosis screening.** / A one-time screening for women ages 64 and over and women at risk for fractures or osteoporosis.  Skin self-exam. / Monthly.  Influenza immunization.** / Every year.  Pneumococcal polysaccharide immunization.** / 1 dose at age 92 (or older) if you have never been vaccinated.  Tetanus, diphtheria, pertussis (Tdap, Td) immunization. / A one-time dose of Tdap vaccine if you are over 65 and have contact with an infant, are a Research scientist (physical sciences), or simply want to be protected from whooping cough. After that, you need a Td booster dose every 10 years.  Varicella immunization.** / Consult your caregiver.  Meningococcal immunization.** / Consult your caregiver.  Hepatitis A immunization.** / Consult your caregiver. 2 doses, 6 to 18 months apart.  Hepatitis B immunization.** /  Check with your caregiver. 3 doses, usually over 6 months. ** Family history and personal history of risk and conditions may change your caregiver's recommendations. Document Released: 01/06/2002 Document Revised: 02/02/2012 Document Reviewed: 04/07/2011 [facility] Patient Information 2013 [place], [place].

## 2013-01-13 ENCOUNTER — Encounter: Payer: Self-pay | Admitting: Internal Medicine

## 2013-02-23 ENCOUNTER — Other Ambulatory Visit: Payer: Self-pay | Admitting: Internal Medicine

## 2013-05-16 ENCOUNTER — Other Ambulatory Visit: Payer: Self-pay | Admitting: Obstetrics and Gynecology

## 2013-05-16 NOTE — Telephone Encounter (Signed)
AEX scheduled 09/15/13 with Dr. Tresa Res; was given #26/0 rf;s at last aex 09/13/12.  Last Vit D level checked 06/02/2011- 53  Please Advise  Chart In Your Door~

## 2013-05-17 NOTE — Telephone Encounter (Signed)
Ok to RF? 

## 2013-07-11 ENCOUNTER — Telehealth: Payer: Self-pay | Admitting: Nurse Practitioner

## 2013-07-11 MED ORDER — ACYCLOVIR 200 MG PO CAPS: 200.0000 mg | ORAL_CAPSULE | Freq: Every day | ORAL | Status: DC

## 2013-07-11 NOTE — Telephone Encounter (Signed)
I LVMTCB. Pt was given Rx for Acyclovir 200mg  #25 1 po 5x day x5 days with outbreaks  x1 year. Pt should still have 2 months remaining. Please advise.

## 2013-07-11 NOTE — Telephone Encounter (Signed)
Patient needs to have her acyclovir refilled. Last fill date. 06-24-13  Sig:  take by month 5 x daily  For 5 days.  No refills left.  Due to cost can it be filled for 3 refills?  [facility].

## 2013-07-11 NOTE — Telephone Encounter (Signed)
Acyclovir 200 mg #25/0 refills sent to pt's pharmacy.

## 2013-07-11 NOTE — Telephone Encounter (Signed)
Acyclovir 200 mg #25/0 refills sent to pt's pharmacy.  

## 2013-09-14 ENCOUNTER — Ambulatory Visit: Payer: Self-pay | Admitting: Obstetrics and Gynecology

## 2013-09-15 ENCOUNTER — Ambulatory Visit: Payer: Self-pay | Admitting: Gynecology

## 2013-09-16 ENCOUNTER — Encounter: Payer: Self-pay | Admitting: Obstetrics and Gynecology

## 2013-09-16 ENCOUNTER — Ambulatory Visit: Payer: Self-pay | Admitting: Gynecology

## 2013-09-19 ENCOUNTER — Encounter: Payer: Self-pay | Admitting: Gynecology

## 2013-09-19 ENCOUNTER — Ambulatory Visit (INDEPENDENT_AMBULATORY_CARE_PROVIDER_SITE_OTHER): Payer: MEDICARE | Admitting: Gynecology

## 2013-09-19 VITALS — BP 120/68 | HR 80 | Resp 14 | Ht 62.0 in | Wt 135.0 lb

## 2013-09-19 DIAGNOSIS — Z13820 Encounter for screening for osteoporosis: Secondary | ICD-10-CM

## 2013-09-19 DIAGNOSIS — Z01419 Encounter for gynecological examination (general) (routine) without abnormal findings: Secondary | ICD-10-CM

## 2013-09-19 DIAGNOSIS — B001 Herpesviral vesicular dermatitis: Secondary | ICD-10-CM

## 2013-09-19 DIAGNOSIS — Z1501 Genetic susceptibility to malignant neoplasm of breast: Secondary | ICD-10-CM

## 2013-09-19 DIAGNOSIS — B009 Herpesviral infection, unspecified: Secondary | ICD-10-CM

## 2013-09-19 DIAGNOSIS — Z853 Personal history of malignant neoplasm of breast: Secondary | ICD-10-CM

## 2013-09-19 MED ORDER — ACYCLOVIR 200 MG PO CAPS: 400.0000 mg | ORAL_CAPSULE | Freq: Two times a day (BID) | ORAL | Status: DC

## 2013-09-19 NOTE — Progress Notes (Signed)
67 y.o. Married Caucasian female   G1P0010 here for annual exam.  She does not report hot flashes, does not have night sweats, does have vaginal dryness.  She is not using lubricant. She does not report post-menopasual bleeding.  Pt is BRCA +1 and had a BSO.   Husband is stage IV prostate cancer, still doing well. Pt with recent annual, only for breast and pelvic.  No LMP recorded. Patient is postmenopausal.          Sexually active: no  The current method of family planning is post menopausal status.    Exercising: yes  walking, zumba, back exercises, dancing (depends) Last pap: 02/20/2010 Negative Abnormal PAP: yes, when she was younger 89  Mammogram: 03/18/2005 BSE: no Colonoscopy: 06/19/11 DEXA: 12/19/08, T-1.0, -1.3 Alcohol: 10 glasses of wine a week  Tobacco: no  Hgb: PCP ; Urine: Unable to void  Health Maintenance  Topic Date Due  . Zostavax  07/01/2007  . Pneumococcal Polysaccharide Vaccine Age 59 And Over  06/30/2012  . Tetanus/tdap  11/24/2012  . Influenza Vaccine  06/24/2013  . Colonoscopy  06/18/2021    Family History  Problem Relation Age of Onset  . Arthritis      maternal side RA cousin  . Kidney disease      maternal side  . Diabetes      1st degree relative, paternal side  . Diabetes Mother   . Hypertension Mother   . Heart disease Mother   . Diabetes Father   . Lung cancer Father     Patient Active Problem List   Diagnosis Date Noted  . Cough 01/28/2012  . Sinusitis 01/28/2012  . Encounter for preventive health examination 10/08/2011  . Rheumatoid factor positive   . Chest pain at rest 06/04/2011  . Heart palpitations 06/04/2011  . HSV 08/14/2009  . ARTHRITIS, HANDS, BILATERAL 05/17/2009  . VENOUS INSUFFICIENCY, CHRONIC 06/12/2008  . HYPERLIPIDEMIA 10/13/2007  . GERD 10/13/2007  . UNSPECIFIED ARTHROPATHY MULTIPLE SITES 10/13/2007  . BACK PAIN, LUMBAR 10/13/2007  . OSTEOPENIA 10/13/2007  . SCOLIOSIS 10/13/2007  . HEADACHE 10/13/2007  . ELEVATED  BP READING WITHOUT DX HYPERTENSION 10/13/2007  . Personal history of malignant neoplasm of breast 10/13/2007    Past Medical History  Diagnosis Date  . Hx of breast cancer   . Headache(784.0)   . Rheumatoid factor positive     OA   had rheum consult  . GERD (gastroesophageal reflux disease)   . Hypercholesteremia   . Breast cancer     Left- Ductal ca SR-, Chemo (Magrinat) rad rx     Past Surgical History  Procedure Laterality Date  . Breast lumpectomy    . Double masectomy    . Ovaries removed    . Laparoscopic bilateral salpingo oopherectomy      Bilateral Simple mastectomies    Allergies: Sulfonamide derivatives and Latex  Current Outpatient Prescriptions  Medication Sig Dispense Refill  . acyclovir (ZOVIRAX) 200 MG capsule Take 1 capsule (200 mg total) by mouth 5 (five) times daily.  25 capsule  0  . calcium carbonate (OS-CAL) 600 MG TABS Take 600 mg by mouth 3 (three) times daily with meals.        . fish oil-omega-3 fatty acids 1000 MG capsule Take 1 g by mouth daily.       Marland Kitchen lovastatin (MEVACOR) 20 MG tablet TAKE 1 TABLET BY MOUTH ONCE DAILY  90 tablet  3  . Magnesium 300 MG CAPS Take 1 capsule by  mouth daily.       Marland Kitchen MAGNESIUM PO Take by mouth.      . NON FORMULARY Vit A  Vit Cand Vit E and Vit B  1 tab po qd      . ranitidine (ZANTAC) 150 MG tablet TAKE 1 TABLET BY MOUTH TWO TIMES A DAY  180 tablet  3  . Vitamin D, Ergocalciferol, (DRISDOL) 50000 UNITS CAPS TAKE 1 CAPSULE BY MOUTH EVERY WEEK  26 capsule  PRN   No current facility-administered medications for this visit.    ROS: Pertinent items are noted in HPI.  Exam:    BP 120/68  Pulse 80  Resp 14  Ht 5\' 2"  (1.575 m)  Wt 135 lb (61.236 kg)  BMI 24.69 kg/m2 Weight change: @WEIGHTCHANGE @ Last 3 height recordings:  Ht Readings from Last 3 Encounters:  09/19/13 5\' 2"  (1.575 m)  01/10/13 5' 2.5" (1.588 m)  06/04/11 5\' 2"  (1.575 m)   General appearance: alert, cooperative and appears stated age Breasts:  surgically absent Abdomen: soft, non-tender; bowel sounds normal; no masses,  no organomegaly Extremities: extremities normal, atraumatic, no cyanosis or edema Skin: Skin color, texture, turgor normal. No rashes or lesions Lymph nodes: Cervical, supraclavicular, and axillary nodes normal. no inguinal nodes palpated Neurologic: Grossly normal   Pelvic: External genitalia:  no lesions              Urethra: normal appearing urethra with no masses, tenderness or lesions              Bartholins and Skenes: normal                 Vagina: atrophic              Cervix: deviated to left              Pap taken: no        Bimanual Exam:  Uterus:  uterus is normal size, shape, consistency and nontender, deviated to left                                      Adnexa:    no masses                                      Rectovaginal: Confirms                                      Anus:  normal sphincter tone, no lesions  A: well woman     P: counseled on osteoporosis, adequate intake of calcium and vitamin D, diet and exercise Pt reminded to get annual chest and pelvic exams as she is BRCA+ DEXA ordered Refill zovirax for fever blister return annually or prn Discussed PAP guideline changes, importance of weight bearing exercises, calcium, vit D and balanced diet.  An After Visit Summary was printed and given to the patient.

## 2014-04-11 ENCOUNTER — Other Ambulatory Visit: Payer: Self-pay | Admitting: Internal Medicine

## 2014-04-14 ENCOUNTER — Ambulatory Visit (INDEPENDENT_AMBULATORY_CARE_PROVIDER_SITE_OTHER): Payer: Medicare Other | Admitting: Internal Medicine

## 2014-04-14 ENCOUNTER — Encounter: Payer: Self-pay | Admitting: Internal Medicine

## 2014-04-14 VITALS — BP 140/82 | Temp 98.0°F | Ht 62.0 in | Wt 138.0 lb

## 2014-04-14 DIAGNOSIS — M899 Disorder of bone, unspecified: Secondary | ICD-10-CM

## 2014-04-14 DIAGNOSIS — M4120 Other idiopathic scoliosis, site unspecified: Secondary | ICD-10-CM

## 2014-04-14 DIAGNOSIS — Z23 Encounter for immunization: Secondary | ICD-10-CM

## 2014-04-14 DIAGNOSIS — E559 Vitamin D deficiency, unspecified: Secondary | ICD-10-CM | POA: Insufficient documentation

## 2014-04-14 DIAGNOSIS — M19049 Primary osteoarthritis, unspecified hand: Secondary | ICD-10-CM

## 2014-04-14 DIAGNOSIS — E782 Mixed hyperlipidemia: Secondary | ICD-10-CM

## 2014-04-14 DIAGNOSIS — M949 Disorder of cartilage, unspecified: Secondary | ICD-10-CM

## 2014-04-14 DIAGNOSIS — Z853 Personal history of malignant neoplasm of breast: Secondary | ICD-10-CM

## 2014-04-14 DIAGNOSIS — M8580 Other specified disorders of bone density and structure, unspecified site: Secondary | ICD-10-CM | POA: Insufficient documentation

## 2014-04-14 DIAGNOSIS — Z0000 Encounter for general adult medical examination without abnormal findings: Secondary | ICD-10-CM

## 2014-04-14 LAB — CBC WITH DIFFERENTIAL/PLATELET
Basophils Absolute: 0 10*3/uL (ref 0.0–0.1)
Basophils Relative: 0.5 % (ref 0.0–3.0)
EOS PCT: 2 % (ref 0.0–5.0)
Eosinophils Absolute: 0.1 10*3/uL (ref 0.0–0.7)
HCT: 37.2 % (ref 36.0–46.0)
Hemoglobin: 12.6 g/dL (ref 12.0–15.0)
LYMPHS PCT: 21.3 % (ref 12.0–46.0)
Lymphs Abs: 0.9 10*3/uL (ref 0.7–4.0)
MCHC: 33.9 g/dL (ref 30.0–36.0)
MCV: 89.7 fl (ref 78.0–100.0)
MONOS PCT: 8 % (ref 3.0–12.0)
Monocytes Absolute: 0.3 10*3/uL (ref 0.1–1.0)
NEUTROS PCT: 68.2 % (ref 43.0–77.0)
Neutro Abs: 2.8 10*3/uL (ref 1.4–7.7)
PLATELETS: 229 10*3/uL (ref 150.0–400.0)
RBC: 4.15 Mil/uL (ref 3.87–5.11)
RDW: 14.3 % (ref 11.5–15.5)
WBC: 4.2 10*3/uL (ref 4.0–10.5)

## 2014-04-14 LAB — LIPID PANEL
CHOL/HDL RATIO: 3
Cholesterol: 196 mg/dL (ref 0–200)
HDL: 77.6 mg/dL (ref >39.00)
LDL CALC: 104 mg/dL — AB (ref 0–99)
Triglycerides: 71 mg/dL (ref 0.0–149.0)
VLDL: 14.2 mg/dL (ref 0.0–40.0)

## 2014-04-14 LAB — HEPATIC FUNCTION PANEL
ALT: 26 U/L (ref 0–35)
AST: 29 U/L (ref 0–37)
Albumin: 4.3 g/dL (ref 3.5–5.2)
Alkaline Phosphatase: 56 U/L (ref 39–117)
BILIRUBIN DIRECT: 0 mg/dL (ref 0.0–0.3)
BILIRUBIN TOTAL: 0.6 mg/dL (ref 0.2–1.2)
Total Protein: 7.3 g/dL (ref 6.0–8.3)

## 2014-04-14 LAB — BASIC METABOLIC PANEL
BUN: 16 mg/dL (ref 6–23)
CO2: 30 mEq/L (ref 19–32)
CREATININE: 1 mg/dL (ref 0.4–1.2)
Calcium: 9.8 mg/dL (ref 8.4–10.5)
Chloride: 100 mEq/L (ref 96–112)
GFR: 60.2 mL/min (ref >60.00)
Glucose, Bld: 104 mg/dL — ABNORMAL HIGH (ref 70–99)
POTASSIUM: 4.3 meq/L (ref 3.5–5.1)
Sodium: 140 mEq/L (ref 135–145)

## 2014-04-14 LAB — TSH: TSH: 0.92 u[IU]/mL (ref 0.35–4.50)

## 2014-04-14 NOTE — Assessment & Plan Note (Signed)
Curvature at 60% and effecting her gait and adding to her back pain per Dr. Lynann Bologna

## 2014-04-14 NOTE — Progress Notes (Signed)
Chief Complaint  Patient presents with  . MEDICARE WELLNESS    Needs a refill of lovastatin    HPI: Patient comes in today for Preventive Medicare wellness visit . No major injuries, ed visits ,hospitalizations , new medications since last visit.  Arthritis  OA Seem dumonski scoliosis   And lbp not to do surgery or injections at this time but physical therapy.  Hand thumbs a problem at times  doesn't think ra sx    Lovastatin no concerns try healthy lifestyle .  Taking vit d 50000k every other week ? Last level  Health Maintenance  Topic Date Due  . Zostavax  07/01/2007  . Influenza Vaccine  06/24/2014  . Colonoscopy  06/18/2021  . Tetanus/tdap  04/14/2024  . Pneumococcal Polysaccharide Vaccine Age 82 And Over  Completed   Health Maintenance Review  LIFESTYLE:  Exercise:   yes Tobacco/ETS: no Alcohol: per day  Wine at night  Sugar beverages: ocass  Sleep:ok Drug use: no Bone density: ? due Colonoscopy: utd 2012?  See scanned document   Hearing:  Ok   Vision:  No limitations at present . Last eye check UTD  Safety:  Has smoke detector and wears seat belts.  No firearms. No excess sun exposure. Sees dentist regularly.  Falls:  No   Advance directive :  Reviewed  Has one.  Memory: Felt to be good  , no concern from her or her family.  Depression: No anhedonia unusual crying or depressive symptoms  Nutrition: Eats well balanced diet; adequate calcium and vitamin D. No swallowing chewing problems.  Injury: no major injuries in the last six months.  Other healthcare providers:  Reviewed today .  Social:  Lives with spouse married. No pets.   Preventive parameters: up-to-date  Reviewed   ADLS:   There are no problems or need for assistance  driving, feeding, obtaining food, dressing, toileting and bathing, managing money using phone. She is independent.  ROS:  GEN/ HEENT: No fever, significant weight changes sweats headaches vision problems hearing  changes, CV/ PULM; No chest pain shortness of breath cough, syncope,edema  change in exercise tolerance. GI /GU: No adominal pain, vomiting, change in bowel habits. No blood in the stool. No significant GU symptoms. SKIN/HEME: ,no acute skin rashes suspicious lesions or bleeding. No lymphadenopathy, nodules, masses.  NEURO/ PSYCH:  No neurologic signs such as weakness numbness. No depression anxiety. IMM/ Allergy: No unusual infections.  Allergy .   REST of 12 system review negative except as per HPI   Past Medical History  Diagnosis Date  . Hx of breast cancer   . Headache(784.0)   . Rheumatoid factor positive     OA   had rheum consult  . GERD (gastroesophageal reflux disease)   . Hypercholesteremia   . Breast cancer     Left- Ductal ca SR-, Chemo (Magrinat) rad rx     Family History  Problem Relation Age of Onset  . Arthritis      maternal side RA cousin  . Kidney disease      maternal side  . Diabetes      1st degree relative, paternal side  . Diabetes Mother   . Hypertension Mother   . Heart disease Mother   . Diabetes Father   . Lung cancer Father     History   Social History  . Marital Status: Married    Spouse Name: N/A    Number of Children: N/A  . Years of Education:  N/A   Social History Main Topics  . Smoking status: Former Research scientist (life sciences)  . Smokeless tobacco: None  . Alcohol Use: Yes  . Drug Use: No  . Sexual Activity: None   Other Topics Concern  . None   Social History Narrative   Married, household of 2. 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 Designated Party release signed 04/17/10.      Husband with final stage prostate cancer at this time.   hh of 2     Outpatient Encounter Prescriptions as of 04/14/2014  Medication Sig  . acyclovir (ZOVIRAX) 200 MG capsule Take 2 capsules (400 mg total) by mouth 2 (two) times daily. Prn outbreak  . aspirin 325 MG EC tablet Take 325 mg by mouth at bedtime as needed for pain.  . calcium  carbonate (OS-CAL) 600 MG TABS Take 600 mg by mouth 3 (three) times daily with meals.    . fish oil-omega-3 fatty acids 1000 MG capsule Take 1 g by mouth daily.   Marland Kitchen lovastatin (MEVACOR) 20 MG tablet TAKE 1 TABLET BY MOUTH ONCE DAILY  . Magnesium 300 MG CAPS Take 1 capsule by mouth daily.   . ranitidine (ZANTAC) 150 MG tablet TAKE 1 TABLET BY MOUTH TWO TIMES A DAY  . Vitamin D, Ergocalciferol, (DRISDOL) 50000 UNITS CAPS capsule Take one capsule once every 2 weeks  . [DISCONTINUED] Vitamin D, Ergocalciferol, (DRISDOL) 50000 UNITS CAPS TAKE 1 CAPSULE BY MOUTH EVERY WEEK  . [DISCONTINUED] cyclobenzaprine (FLEXERIL) 5 MG tablet   . [DISCONTINUED] MAGNESIUM PO Take by mouth.  . [DISCONTINUED] NON FORMULARY Vit A  Vit Cand Vit E and Vit B  1 tab po qd    EXAM:  BP 140/82  Temp(Src) 98 F (36.7 C) (Oral)  Ht 5\' 2"  (1.575 m)  Wt 138 lb (62.596 kg)  BMI 25.23 kg/m2  Body mass index is 25.23 kg/(m^2).  Physical Exam: Vital signs reviewed GEX:BMWU is a well-developed well-nourished alert cooperative   who appears stated age in no acute distress.  HEENT: normocephalic atraumatic , Eyes: PERRL EOM's full, conjunctiva clear, Nares: paten,t no deformity discharge or tenderness., Ears: no deformity EAC's clear TMs with normal landmarks. Mouth: clear OP, no lesions, edema.  Moist mucous membranes. Dentition in adequate repair. NECK: supple without masses, thyromegaly or bruits. CHEST/PULM:  Clear to auscultation and percussion breath sounds equal no wheeze , rales or rhonchi. No chest wall deformities or tenderness.POS liosis Breast absent  CV: PMI is nondisplaced, S1 S2 no gallops, murmurs, rubs. Peripheral pulses are full without delay.No JVD .  ABDOMEN: Bowel sounds normal nontender  No guard or rebound, no hepato splenomegal no CVA tenderness.  No hernia. Extremtities:  No clubbing cyanosis or edema, no acute joint swelling or redness no focal atrophy NEURO:  Oriented x3, cranial nerves 3-12  appear to be intact, no obvious focal weakness,gait within normal limits no abnormal reflexes or asymmetrical SKIN: No acute rashes normal turgor, color, no bruising or petechiae. PSYCH: Oriented, good eye contact, no obvious depression anxiety, cognition and judgment appear normal. LN: no cervical axillary inguinal adenopathy No noted deficits in memory, attention, and speech.   Lab Results  Component Value Date   WBC 4.2 04/14/2014   HGB 12.6 04/14/2014   HCT 37.2 04/14/2014   PLT 229.0 04/14/2014   GLUCOSE 104* 04/14/2014   CHOL 196 04/14/2014   TRIG 71.0 04/14/2014   HDL 77.60 04/14/2014   LDLDIRECT 119.9 12/30/2012   LDLCALC 104* 04/14/2014  ALT 26 04/14/2014   AST 29 04/14/2014   NA 140 04/14/2014   K 4.3 04/14/2014   CL 100 04/14/2014   CREATININE 1.0 04/14/2014   BUN 16 04/14/2014   CO2 30 04/14/2014   TSH 0.92 04/14/2014   Vit d pending ASSESSMENT AND PLAN:  Discussed the following assessment and plan:  Encounter for preventive health examination - utd today - Plan: Basic metabolic panel, CBC with Differential, Hepatic function panel, Lipid panel, TSH  Mixed hyperlipidemia - on medication - Plan: Basic metabolic panel, CBC with Differential, Hepatic function panel, Lipid panel, TSH  Personal history of malignant neoplasm of breast - Plan: Basic metabolic panel, CBC with Differential, Hepatic function panel, Lipid panel, TSH  ARTHRITIS, HANDS, BILATERAL - Plan: Basic metabolic panel, CBC with Differential, Hepatic function panel, Lipid panel, TSH  SCOLIOSIS - Plan: Basic metabolic panel, CBC with Differential, Hepatic function panel, Lipid panel, TSH  Need for tetanus booster - Plan: Tetanus vaccine IM  Need for 23-polyvalent pneumococcal polysaccharide vaccine - Plan: Pneumococcal polysaccharide vaccine 23-valent greater than or equal to 2yo subcutaneous/IM  Unspecified vitamin D deficiency - needs level check on rx med  - Plan: Vit D  25 hydroxy (rtn osteoporosis  monitoring)  Osteopenia - dexa order today - Plan: Vit D  25 hydroxy (rtn osteoporosis monitoring)  Patient Care Team: Burnis Medin, MD as PCP - General Christoper Allegra, MD (Rheumatology) Cleotis Nipper, MD (Gastroenterology) Sinclair Ship, MD as Consulting Physician (Orthopedic Surgery) Jolyn Nap, MD as Consulting Physician (Orthopedic Surgery) Griffith Citron, MD as Consulting Physician (Ophthalmology) Azalia Bilis, MD as Consulting Physician (Obstetrics and Gynecology)  Patient Instructions  Continue lifestyle intervention healthy eating and exercise . 150 minutes of exercise weeks  , weight healthy levels. Avoid trans fats and processed foods;  Increase fresh fruits and veges to 5 servings per day. And avoid sweet beverages  Including tea and juice. Agree with PT  To try.  ? Bone density . Order.  Will notify you  of labs when available. Otherwise wellness in 1 year    [name]. [name] M.D.   Pre visit review using our clinic review tool, if applicable. No additional management support is needed unless otherwise documented below in the visit note.

## 2014-04-14 NOTE — Patient Instructions (Addendum)
Continue lifestyle intervention healthy eating and exercise . 150 minutes of exercise weeks  , weight healthy levels. Avoid trans fats and processed foods;  Increase fresh fruits and veges to 5 servings per day. And avoid sweet beverages  Including tea and juice. Agree with PT  To try.  ? Bone density . Order.  Will notify you  of labs when available. Otherwise wellness in 1 year

## 2014-04-15 ENCOUNTER — Other Ambulatory Visit: Payer: Self-pay | Admitting: Internal Medicine

## 2014-04-15 LAB — VITAMIN D 25 HYDROXY (VIT D DEFICIENCY, FRACTURES): VIT D 25 HYDROXY: 75 ng/mL (ref 30–89)

## 2014-04-18 ENCOUNTER — Telehealth: Payer: Self-pay | Admitting: Internal Medicine

## 2014-04-18 NOTE — Telephone Encounter (Signed)
Pt is needing new rx for lovastatin (MEVACOR) 20 MG tablet, send to [facility] on [address].

## 2014-04-18 NOTE — Telephone Encounter (Signed)
Sent to the pharmacy by e-scribe. 

## 2014-04-19 ENCOUNTER — Other Ambulatory Visit: Payer: Self-pay | Admitting: Gynecology

## 2014-04-19 ENCOUNTER — Telehealth: Payer: Self-pay | Admitting: Gynecology

## 2014-04-19 DIAGNOSIS — Z13820 Encounter for screening for osteoporosis: Secondary | ICD-10-CM

## 2014-04-19 DIAGNOSIS — M810 Age-related osteoporosis without current pathological fracture: Secondary | ICD-10-CM

## 2014-04-19 DIAGNOSIS — M8580 Other specified disorders of bone density and structure, unspecified site: Secondary | ICD-10-CM

## 2014-04-19 NOTE — Telephone Encounter (Signed)
[facility] is calling saying they need dr lathrop to sign off of a correction they made to an order for patients bone density

## 2014-04-19 NOTE — Telephone Encounter (Signed)
Order signed by Dr.Lathrop.  Routing to provider for final review. Patient agreeable to disposition. Will close encounter

## 2014-04-19 NOTE — Telephone Encounter (Signed)
Routing to Dr.Lathrop for sign.

## 2014-04-20 ENCOUNTER — Other Ambulatory Visit: Payer: Self-pay | Admitting: Gynecology

## 2014-04-20 ENCOUNTER — Other Ambulatory Visit: Payer: Self-pay

## 2014-04-20 DIAGNOSIS — M810 Age-related osteoporosis without current pathological fracture: Secondary | ICD-10-CM

## 2014-04-21 ENCOUNTER — Ambulatory Visit
Admission: RE | Admit: 2014-04-21 | Discharge: 2014-04-21 | Disposition: A | Discharge status: Home / Self Care | Payer: Medicare Other | Source: Ambulatory Visit | Attending: Internal Medicine | Admitting: Internal Medicine

## 2014-04-21 ENCOUNTER — Other Ambulatory Visit: Payer: Self-pay | Admitting: Internal Medicine

## 2014-04-21 ENCOUNTER — Ambulatory Visit
Admission: RE | Admit: 2014-04-21 | Discharge: 2014-04-21 | Disposition: A | Discharge status: Home / Self Care | Payer: Medicare Other | Source: Ambulatory Visit | Attending: Gynecology | Admitting: Gynecology

## 2014-04-21 ENCOUNTER — Telehealth: Payer: Self-pay | Admitting: Gynecology

## 2014-04-21 DIAGNOSIS — E2839 Other primary ovarian failure: Secondary | ICD-10-CM

## 2014-04-21 DIAGNOSIS — M8580 Other specified disorders of bone density and structure, unspecified site: Secondary | ICD-10-CM

## 2014-04-21 DIAGNOSIS — M810 Age-related osteoporosis without current pathological fracture: Secondary | ICD-10-CM

## 2014-04-21 NOTE — Telephone Encounter (Signed)
Patient has an  appointment today at 11:00 for BMD. Please send order.

## 2014-04-21 NOTE — Telephone Encounter (Signed)
Orders entered previously. New order placed for Bone Density. They faxed over paper for MD signature and they state it must not be electronic at this point, only paper copy with MD signature.  Faxed over orders with fax confirmation received.

## 2014-04-21 NOTE — Telephone Encounter (Signed)
Confirmed receipt with Ena Dawley at breast center.   Routing to provider for final review. Patient agreeable to disposition. Will close encounter

## 2014-05-03 ENCOUNTER — Encounter: Payer: Self-pay | Admitting: Family Medicine

## 2014-07-24 ENCOUNTER — Other Ambulatory Visit: Payer: Self-pay

## 2014-07-24 DIAGNOSIS — B001 Herpesviral vesicular dermatitis: Secondary | ICD-10-CM

## 2014-07-24 MED ORDER — ACYCLOVIR 200 MG PO CAPS: 400.0000 mg | ORAL_CAPSULE | Freq: Two times a day (BID) | ORAL | Status: DC

## 2014-07-24 NOTE — Telephone Encounter (Signed)
Last AEX: 09/19/13 Last refill:09/19/13 #40 X 3 Current AEX:12/01/14  Please advise

## 2014-07-25 ENCOUNTER — Other Ambulatory Visit: Payer: Self-pay

## 2014-07-25 NOTE — Telephone Encounter (Signed)
Last AEX: 09/19/13 Last refill:07/04/2011 #15 Current AEX:12/01/14  Please advise

## 2014-07-26 MED ORDER — ACYCLOVIR 5 % EX OINT: 1.0000 "application " | TOPICAL_OINTMENT | Freq: Three times a day (TID) | CUTANEOUS | Status: DC | PRN

## 2014-09-08 ENCOUNTER — Other Ambulatory Visit: Payer: Self-pay

## 2014-09-25 ENCOUNTER — Encounter: Payer: Self-pay | Admitting: Internal Medicine

## 2014-11-27 ENCOUNTER — Other Ambulatory Visit: Payer: Self-pay | Admitting: Obstetrics & Gynecology

## 2014-11-29 ENCOUNTER — Other Ambulatory Visit: Payer: Self-pay | Admitting: Obstetrics and Gynecology

## 2014-11-29 NOTE — Telephone Encounter (Signed)
Patient's pharmacy calling requesting a refill for Vitamin D 2.

## 2014-11-29 NOTE — Telephone Encounter (Signed)
Medication refill request: Vitamin D 50,000 Last AEX:  09/19/13 Next AEX: 03/02/15 Last MMG (if hormonal medication request): 02/2005 BIRADS2: Benign. Hx of Breast Cancer.  Refill authorized:    Patient had Vitamin D check @Dr . [name] office.  - Called pharmacy. They have to send Rx request to Dr. Regis Bill office since they checked her vitamin D last time.

## 2014-11-30 ENCOUNTER — Other Ambulatory Visit: Payer: Self-pay

## 2014-11-30 NOTE — Telephone Encounter (Signed)
Rx request for Vitamin D2 50,0000 UNITS- note was sent to gynecologist and since you last checked vitamin D they would like you to decide. Pls advise.   Pharm:  [facility]

## 2014-12-01 ENCOUNTER — Ambulatory Visit: Payer: MEDICARE | Admitting: Gynecology

## 2014-12-01 NOTE — Telephone Encounter (Signed)
Change back to otc vit d3  2000 - 3000 iu per day and we plan to check the level at her next wellness visit labs

## 2014-12-01 NOTE — Telephone Encounter (Signed)
Pt aware and verbalized understanding.  

## 2014-12-01 NOTE — Telephone Encounter (Signed)
Vit D has not been checked since 04/14/14. Please advise

## 2014-12-18 ENCOUNTER — Other Ambulatory Visit: Payer: Self-pay | Admitting: Obstetrics and Gynecology

## 2015-02-06 ENCOUNTER — Encounter: Payer: Self-pay | Admitting: Family Medicine

## 2015-02-06 ENCOUNTER — Ambulatory Visit (INDEPENDENT_AMBULATORY_CARE_PROVIDER_SITE_OTHER): Payer: Medicare PPO | Admitting: Family Medicine

## 2015-02-06 VITALS — BP 119/81 | HR 116 | Temp 100.6°F

## 2015-02-06 DIAGNOSIS — J209 Acute bronchitis, unspecified: Secondary | ICD-10-CM | POA: Diagnosis not present

## 2015-02-06 MED ORDER — METHYLPREDNISOLONE ACETATE 80 MG/ML IJ SUSP
120.0000 mg | Freq: Once | INTRAMUSCULAR | Status: AC
  Administered 2015-02-06: 120 mg via INTRAMUSCULAR

## 2015-02-06 MED ORDER — AMOXICILLIN-POT CLAVULANATE 875-125 MG PO TABS: 1.0000 | ORAL_TABLET | Freq: Two times a day (BID) | ORAL | Status: DC

## 2015-02-06 MED ORDER — HYDROCODONE-HOMATROPINE 5-1.5 MG/5ML PO SYRP: 5.0000 mL | ORAL_SOLUTION | ORAL | Status: DC | PRN

## 2015-02-06 MED ORDER — ALBUTEROL SULFATE HFA 108 (90 BASE) MCG/ACT IN AERS: 2.0000 | INHALATION_SPRAY | RESPIRATORY_TRACT | Status: DC | PRN

## 2015-02-06 NOTE — Progress Notes (Signed)
   Subjective:    Patient ID: Nicole [name], female    DOB: 1946/10/27, 69 y.o.   MRN: [id number]  HPI Here for 4 days of fever, chest tightness, coughing up green sputum, and mild SOB.    Review of Systems  Constitutional: Positive for fever.  HENT: Positive for congestion. Negative for postnasal drip and sinus pressure.   Eyes: Negative.   Respiratory: Positive for cough, chest tightness, shortness of breath and wheezing.   Cardiovascular: Negative.        Objective:   Physical Exam  Constitutional: She appears well-developed and well-nourished.  HENT:  Right Ear: External ear normal.  Left Ear: External ear normal.  Nose: Nose normal.  Mouth/Throat: Oropharynx is clear and moist.  Eyes: Conjunctivae are normal.  Pulmonary/Chest: Effort normal. She has no rales.  Scattered rhonchi and wheezes   Lymphadenopathy:    She has no cervical adenopathy.          Assessment & Plan:  Given a steroid shot and an inhaler to help with bronchospasm. Treat with Augmentin.

## 2015-02-06 NOTE — Addendum Note (Signed)
Addended by: Aggie Hacker A on: 02/06/2015 03:00 PM   Modules accepted: Orders

## 2015-02-06 NOTE — Progress Notes (Signed)
Pre visit review using our clinic review tool, if applicable. No additional management support is needed unless otherwise documented below in the visit note. 

## 2015-03-02 ENCOUNTER — Telehealth: Payer: Self-pay | Admitting: Obstetrics and Gynecology

## 2015-03-02 ENCOUNTER — Ambulatory Visit: Payer: BC Managed Care – PPO | Admitting: Obstetrics and Gynecology

## 2015-03-02 ENCOUNTER — Ambulatory Visit: Payer: MEDICARE | Admitting: Obstetrics and Gynecology

## 2015-03-06 ENCOUNTER — Encounter: Payer: Self-pay | Admitting: Internal Medicine

## 2015-03-06 ENCOUNTER — Ambulatory Visit (INDEPENDENT_AMBULATORY_CARE_PROVIDER_SITE_OTHER): Payer: Medicare PPO | Admitting: Internal Medicine

## 2015-03-06 VITALS — BP 142/80 | Temp 98.4°F | Ht 62.0 in | Wt 138.0 lb

## 2015-03-06 DIAGNOSIS — Z23 Encounter for immunization: Secondary | ICD-10-CM | POA: Diagnosis not present

## 2015-03-06 DIAGNOSIS — S93409A Sprain of unspecified ligament of unspecified ankle, initial encounter: Secondary | ICD-10-CM

## 2015-03-06 DIAGNOSIS — R030 Elevated blood-pressure reading, without diagnosis of hypertension: Secondary | ICD-10-CM

## 2015-03-06 DIAGNOSIS — E782 Mixed hyperlipidemia: Secondary | ICD-10-CM | POA: Diagnosis not present

## 2015-03-06 DIAGNOSIS — Z853 Personal history of malignant neoplasm of breast: Secondary | ICD-10-CM

## 2015-03-06 DIAGNOSIS — Z0000 Encounter for general adult medical examination without abnormal findings: Secondary | ICD-10-CM

## 2015-03-06 DIAGNOSIS — Z1509 Genetic susceptibility to other malignant neoplasm: Secondary | ICD-10-CM

## 2015-03-06 DIAGNOSIS — S93401S Sprain of unspecified ligament of right ankle, sequela: Secondary | ICD-10-CM

## 2015-03-06 DIAGNOSIS — Z1501 Genetic susceptibility to malignant neoplasm of breast: Secondary | ICD-10-CM

## 2015-03-06 HISTORY — DX: Sprain of unspecified ligament of unspecified ankle, initial encounter: S93.409A

## 2015-03-06 LAB — BASIC METABOLIC PANEL
BUN: 15 mg/dL (ref 6–23)
CALCIUM: 10.1 mg/dL (ref 8.4–10.5)
CHLORIDE: 100 meq/L (ref 96–112)
CO2: 32 meq/L (ref 19–32)
Creatinine, Ser: 0.82 mg/dL (ref 0.40–1.20)
GFR: 73.75 mL/min (ref >60.00)
Glucose, Bld: 100 mg/dL — ABNORMAL HIGH (ref 70–99)
Potassium: 4.7 mEq/L (ref 3.5–5.1)
SODIUM: 136 meq/L (ref 135–145)

## 2015-03-06 LAB — POCT URINALYSIS DIP (MANUAL ENTRY)
BILIRUBIN UA: NEGATIVE
Bilirubin, UA: NEGATIVE
Blood, UA: NEGATIVE
Glucose, UA: NEGATIVE
Nitrite, UA: NEGATIVE
Protein Ur, POC: NEGATIVE
Spec Grav, UA: 1.01
UROBILINOGEN UA: 0.2
pH, UA: 7

## 2015-03-06 LAB — CBC WITH DIFFERENTIAL/PLATELET
BASOS ABS: 0 10*3/uL (ref 0.0–0.1)
BASOS PCT: 0.5 % (ref 0.0–3.0)
Eosinophils Absolute: 0.1 10*3/uL (ref 0.0–0.7)
Eosinophils Relative: 2.8 % (ref 0.0–5.0)
HCT: 38 % (ref 36.0–46.0)
Hemoglobin: 12.8 g/dL (ref 12.0–15.0)
Lymphocytes Relative: 21.4 % (ref 12.0–46.0)
Lymphs Abs: 1.1 10*3/uL (ref 0.7–4.0)
MCHC: 33.6 g/dL (ref 30.0–36.0)
MCV: 87.7 fl (ref 78.0–100.0)
Monocytes Absolute: 0.4 10*3/uL (ref 0.1–1.0)
Monocytes Relative: 8.3 % (ref 3.0–12.0)
NEUTROS PCT: 67 % (ref 43.0–77.0)
Neutro Abs: 3.4 10*3/uL (ref 1.4–7.7)
PLATELETS: 234 10*3/uL (ref 150.0–400.0)
RBC: 4.34 Mil/uL (ref 3.87–5.11)
RDW: 14.6 % (ref 11.5–15.5)
WBC: 5.1 10*3/uL (ref 4.0–10.5)

## 2015-03-06 LAB — LIPID PANEL
CHOL/HDL RATIO: 3
Cholesterol: 217 mg/dL — ABNORMAL HIGH (ref 0–200)
HDL: 83.6 mg/dL (ref >39.00)
LDL Cholesterol: 117 mg/dL — ABNORMAL HIGH (ref 0–99)
NONHDL: 133.4
Triglycerides: 84 mg/dL (ref 0.0–149.0)
VLDL: 16.8 mg/dL (ref 0.0–40.0)

## 2015-03-06 LAB — HEPATIC FUNCTION PANEL
ALT: 14 U/L (ref 0–35)
AST: 18 U/L (ref 0–37)
Albumin: 4.4 g/dL (ref 3.5–5.2)
Alkaline Phosphatase: 72 U/L (ref 39–117)
BILIRUBIN TOTAL: 0.4 mg/dL (ref 0.2–1.2)
Bilirubin, Direct: 0.1 mg/dL (ref 0.0–0.3)
TOTAL PROTEIN: 7.6 g/dL (ref 6.0–8.3)

## 2015-03-06 LAB — TSH: TSH: 1.35 u[IU]/mL (ref 0.35–4.50)

## 2015-03-06 NOTE — Patient Instructions (Addendum)
Continue lifestyle intervention healthy eating and exercise . Ice after activity can take 6-8 weeks  To recover from ankle sprain . Check into shingles vaccine ( Zostavax) reimbursement or cost to you  and can return at any time if call ahead for injection.   Take blood pressure readings twice a day for 7- 10 days and then periodically .To ensure below 140/90   .OV if not at goal .    Healthy lifestyle includes : At least 150 minutes of exercise weeks  , weight at healthy levels, which is usually   BMI 19-25. Avoid trans fats and processed foods;  Increase fresh fruits and veges to 5 servings per day. And avoid sweet beverages including tea and juice. Mediterranean diet with olive oil and nuts have been noted to be heart and brain healthy . Avoid tobacco products . Limit  alcohol to  7 per week for women and 14 servings for men.  Get adequate sleep . Wear seat belts . Don't text and drive .    Acute Ankle Sprain with Phase I Rehab An acute ankle sprain is a partial or complete tear in one or more of the ligaments of the ankle due to traumatic injury. The severity of the injury depends on both the number of ligaments sprained and the grade of sprain. There are 3 grades of sprains.   A grade 1 sprain is a mild sprain. There is a slight pull without obvious tearing. There is no loss of strength, and the muscle and ligament are the correct length.  A grade 2 sprain is a moderate sprain. There is tearing of fibers within the substance of the ligament where it connects two bones or two cartilages. The length of the ligament is increased, and there is usually decreased strength.  A grade 3 sprain is a complete rupture of the ligament and is uncommon. In addition to the grade of sprain, there are three types of ankle sprains.  Lateral ankle sprains: This is a sprain of one or more of the three ligaments on the outer side (lateral) of the ankle. These are the most common sprains. Medial ankle  sprains: There is one large triangular ligament of the inner side (medial) of the ankle that is susceptible to injury. Medial ankle sprains are less common. Syndesmosis, "high ankle," sprains: The syndesmosis is the ligament that connects the two bones of the lower leg. Syndesmosis sprains usually only occur with very severe ankle sprains. SYMPTOMS  Pain, tenderness, and swelling in the ankle, starting at the side of injury that may progress to the whole ankle and foot with time.  "Pop" or tearing sensation at the time of injury.  Bruising that may spread to the heel.  Impaired ability to walk soon after injury. CAUSES   Acute ankle sprains are caused by trauma placed on the ankle that temporarily forces or pries the anklebone (talus) out of its normal socket.  Stretching or tearing of the ligaments that normally hold the joint in place (usually due to a twisting injury). RISK INCREASES WITH:  Previous ankle sprain.  Sports in which the foot may land awkwardly (i.e., basketball, volleyball, or soccer) or walking or running on uneven or rough surfaces.  Shoes with inadequate support to prevent sideways motion when stress occurs.  Poor strength and flexibility.  Poor balance skills.  Contact sports. PREVENTION   Warm up and stretch properly before activity.  Maintain physical fitness:  Ankle and leg flexibility, muscle strength, and endurance.  Cardiovascular fitness.  Balance training activities.  Use proper technique and have a coach correct improper technique.  Taping, protective strapping, bracing, or high-top tennis shoes may help prevent injury. Initially, tape is best; however, it loses most of its support function within 10 to 15 minutes.  Wear proper-fitted protective shoes (High-top shoes with taping or bracing is more effective than either alone).  Provide the ankle with support during sports and practice activities for 12 months following injury. PROGNOSIS    If treated properly, ankle sprains can be expected to recover completely; however, the length of recovery depends on the degree of injury.  A grade 1 sprain usually heals enough in 5 to 7 days to allow modified activity and requires an average of 6 weeks to heal completely.  A grade 2 sprain requires 6 to 10 weeks to heal completely.  A grade 3 sprain requires 12 to 16 weeks to heal.  A syndesmosis sprain often takes more than 3 months to heal. RELATED COMPLICATIONS   Frequent recurrence of symptoms may result in a chronic problem. Appropriately addressing the problem the first time decreases the frequency of recurrence and optimizes healing time. Severity of the initial sprain does not predict the likelihood of later instability.  Injury to other structures (bone, cartilage, or tendon).  A chronically unstable or arthritic ankle joint is a possibility with repeated sprains. TREATMENT Treatment initially involves the use of ice, medication, and compression bandages to help reduce pain and inflammation. Ankle sprains are usually immobilized in a walking cast or boot to allow for healing. Crutches may be recommended to reduce pressure on the injury. After immobilization, strengthening and stretching exercises may be necessary to regain strength and a full range of motion. Surgery is rarely needed to treat ankle sprains. MEDICATION   Nonsteroidal anti-inflammatory medications, such as aspirin and ibuprofen (do not take for the first 3 days after injury or within 7 days before surgery), or other minor pain relievers, such as acetaminophen, are often recommended. Take these as directed by your caregiver. Contact your caregiver immediately if any bleeding, stomach upset, or signs of an allergic reaction occur from these medications.  Ointments applied to the skin may be helpful.  Pain relievers may be prescribed as necessary by your caregiver. Do not take prescription pain medication for longer  than 4 to 7 days. Use only as directed and only as much as you need. HEAT AND COLD  Cold treatment (icing) is used to relieve pain and reduce inflammation for acute and chronic cases. Cold should be applied for 10 to 15 minutes every 2 to 3 hours for inflammation and pain and immediately after any activity that aggravates your symptoms. Use ice packs or an ice massage.  Heat treatment may be used before performing stretching and strengthening activities prescribed by your caregiver. Use a heat pack or a warm soak. SEEK IMMEDIATE MEDICAL CARE IF:   Pain, swelling, or bruising worsens despite treatment.  You experience pain, numbness, discoloration, or coldness in the foot or toes.  New, unexplained symptoms develop (drugs used in treatment may produce side effects.) EXERCISES  PHASE I EXERCISES RANGE OF MOTION (ROM) AND STRETCHING EXERCISES - Ankle Sprain, Acute Phase I, Weeks 1 to 2 These exercises may help you when beginning to restore flexibility in your ankle. You will likely work on these exercises for the 1 to 2 weeks after your injury. Once your physician, physical therapist, or athletic trainer sees adequate progress, he or she will advance your  exercises. While completing these exercises, remember:   Restoring tissue flexibility helps normal motion to return to the joints. This allows healthier, less painful movement and activity.  An effective stretch should be held for at least 30 seconds.  A stretch should never be painful. You should only feel a gentle lengthening or release in the stretched tissue. RANGE OF MOTION - Dorsi/Plantar Flexion  While sitting with your right / left knee straight, draw the top of your foot upwards by flexing your ankle. Then reverse the motion, pointing your toes downward.  Hold each position for __________ seconds.  After completing your first set of exercises, repeat this exercise with your knee bent. Repeat __________ times. Complete this  exercise __________ times per day.  RANGE OF MOTION - Ankle Alphabet  Imagine your right / left big toe is a pen.  Keeping your hip and knee still, write out the entire alphabet with your "pen." Make the letters as large as you can without increasing any discomfort. Repeat __________ times. Complete this exercise __________ times per day.  STRENGTHENING EXERCISES - Ankle Sprain, Acute -Phase I, Weeks 1 to 2 These exercises may help you when beginning to restore strength in your ankle. You will likely work on these exercises for 1 to 2 weeks after your injury. Once your physician, physical therapist, or athletic trainer sees adequate progress, he or she will advance your exercises. While completing these exercises, remember:   Muscles can gain both the endurance and the strength needed for everyday activities through controlled exercises.  Complete these exercises as instructed by your physician, physical therapist, or athletic trainer. Progress the resistance and repetitions only as guided.  You may experience muscle soreness or fatigue, but the pain or discomfort you are trying to eliminate should never worsen during these exercises. If this pain does worsen, stop and make certain you are following the directions exactly. If the pain is still present after adjustments, discontinue the exercise until you can discuss the trouble with your clinician. STRENGTH - Dorsiflexors  Secure a rubber exercise band/tubing to a fixed object (i.e., table, pole) and loop the other end around your right / left foot.  Sit on the floor facing the fixed object. The band/tubing should be slightly tense when your foot is relaxed.  Slowly draw your foot back toward you using your ankle and toes.  Hold this position for __________ seconds. Slowly release the tension in the band and return your foot to the starting position. Repeat __________ times. Complete this exercise __________ times per day.  STRENGTH -  Plantar-flexors   Sit with your right / left leg extended. Holding onto both ends of a rubber exercise band/tubing, loop it around the ball of your foot. Keep a slight tension in the band.  Slowly push your toes away from you, pointing them downward.  Hold this position for __________ seconds. Return slowly, controlling the tension in the band/tubing. Repeat __________ times. Complete this exercise __________ times per day.  STRENGTH - Ankle Eversion  Secure one end of a rubber exercise band/tubing to a fixed object (table, pole). Loop the other end around your foot just before your toes.  Place your fists between your knees. This will focus your strengthening at your ankle.  Drawing the band/tubing across your opposite foot, slowly, pull your little toe out and up. Make sure the band/tubing is positioned to resist the entire motion.  Hold this position for __________ seconds. Have your muscles resist the band/tubing as it  slowly pulls your foot back to the starting position.  Repeat __________ times. Complete this exercise __________ times per day.  STRENGTH - Ankle Inversion  Secure one end of a rubber exercise band/tubing to a fixed object (table, pole). Loop the other end around your foot just before your toes.  Place your fists between your knees. This will focus your strengthening at your ankle.  Slowly, pull your big toe up and in, making sure the band/tubing is positioned to resist the entire motion.  Hold this position for __________ seconds.  Have your muscles resist the band/tubing as it slowly pulls your foot back to the starting position. Repeat __________ times. Complete this exercises __________ times per day.  STRENGTH - Towel Curls  Sit in a chair positioned on a non-carpeted surface.  Place your right / left foot on a towel, keeping your heel on the floor.  Pull the towel toward your heel by only curling your toes. Keep your heel on the floor.  If instructed  by your physician, physical therapist, or athletic trainer, add weight to the end of the towel.  Acute Ankle Sprain with Phase II Rehab An acute ankle sprain is a partial or complete tear in one or more of the ligaments of the ankle due to traumatic injury. The severity of the injury depends on both the number of ligaments sprained and the grade of sprain. There are 3 grades of sprains.  A grade 1 sprain is a mild sprain. There is a slight pull without obvious tearing. There is no loss of strength, and the muscle and ligament are the correct length.  A grade 2 sprain is a moderate sprain. There is tearing of fibers within the substance of the ligament where it connects two bones or two cartilages. The length of the ligament is increased, and there is usually decreased strength.  A grade 3 sprain is a complete rupture of the ligament and is uncommon. In addition to the grade of sprain, there are 3 types of ankle sprains.  Lateral ankle sprains. This is a sprain of one or more of the 3 ligaments on the outer side (lateral) of the ankle. These are the most common sprains. Medial ankle sprains. There is one large triangular ligament on the inner side (medial) of the ankle that is susceptible to injury. Medial ankle sprains are less common. Syndesmosis, "high ankle," sprains. The syndesmosis is the ligament that connects the two bones of the lower leg. Syndesmosis sprains usually only occur with very severe ankle sprains. SYMPTOMS  Pain, tenderness, and swelling in the ankle, starting at the side of injury that may progress to the whole ankle and foot with time.  "Pop" or tearing sensation at the time of injury.  Bruising that may spread to the heel.  Impaired ability to walk soon after injury. CAUSES   Acute ankle sprains are caused by trauma placed on the ankle that temporarily forces or pries the anklebone (talus) out of its normal socket.  Stretching or tearing of the ligaments that  normally hold the joint in place (usually due to a twisting injury). RISK INCREASES WITH:  Previous ankle sprain.  Sports in which the foot may land awkwardly (basketball, volleyball, soccer) or walking or running on uneven or rough surfaces.  Shoes with inadequate support to prevent sideways motion when stress occurs.  Poor strength and flexibility.  Poor balance skills.  Contact sports. PREVENTION  Warm up and stretch properly before activity.  Maintain physical fitness:  Ankle and leg flexibility, muscle strength, and endurance.  Cardiovascular fitness.  Balance training activities.  Use proper technique and have a coach correct improper technique.  Taping, protective strapping, bracing, or high-top tennis shoes may help prevent injury. Initially, tape is best. However, it loses most of its support function within 10 to 15 minutes.  Wear proper fitted protective shoes. Combining high-top shoes with taping or bracing is more effective than using either alone.  Provide the ankle with support during sports and practice activities for 12 months following injury. PROGNOSIS   If treated properly, ankle sprains can be expected to recover completely. However, the length of recovery depends on the degree of injury.  A grade 1 sprain usually heals enough in 5 to 7 days to allow modified activity and requires an average of 6 weeks to heal completely.  A grade 2 sprain requires 6 to 10 weeks to heal completely.  A grade 3 sprain requires 12 to 16 weeks to heal.  A syndesmosis sprain often takes more than 3 months to heal. RELATED COMPLICATIONS   Frequent recurrence of symptoms may result in a chronic problem. Appropriately addressing the problem the first time decreases the frequency of recurrence and optimizes healing time. Severity of initial sprain does not predict the likelihood of later instability.  Injury to other structures (bone, cartilage, or tendon).  Chronically  unstable or arthritic ankle joint are possible with repeated sprains. TREATMENT Treatment initially involves the use of ice, medicine, and compression bandages to help reduce pain and inflammation. Ankle sprains are usually immobilized in a walking cast or boot to allow for healing. Crutches may be recommended to reduce pressure on the injury. After immobilization, strengthening and stretching exercises may be necessary to regain strength and a full range of motion. Surgery is rarely needed to treat ankle sprains. MEDICATION   Nonsteroidal anti-inflammatory medicines, such as aspirin and ibuprofen (do not take for the first 3 days after injury or within 7 days before surgery), or other minor pain relievers, such as acetaminophen, are often recommended. Take these as directed by your caregiver. Contact your caregiver immediately if any bleeding, stomach upset, or signs of an allergic reaction occur from these medicines.  Ointments applied to the skin may be helpful.  Pain relievers may be prescribed as necessary by your caregiver. Do not take prescription pain medicine for longer than 4 to 7 days. Use only as directed and only as much as you need. HEAT AND COLD  Cold treatment (icing) is used to relieve pain and reduce inflammation for acute and chronic cases. Cold should be applied for 10 to 15 minutes every 2 to 3 hours for inflammation and pain and immediately after any activity that aggravates your symptoms. Use ice packs or an ice massage.  Heat treatment may be used before performing stretching and strengthening activities prescribed by your caregiver. Use a heat pack or a warm soak. SEEK IMMEDIATE MEDICAL CARE IF:   Pain, swelling, or bruising worsens despite treatment.  You experience pain, numbness, discoloration, or coldness in the foot or toes.  New, unexplained symptoms develop. (Drugs used in treatment may produce side effects.) EXERCISES  PHASE II EXERCISES RANGE OF MOTION (ROM)  AND STRETCHING EXERCISES - Ankle Sprain, Acute-Phase II, Weeks 3 to 4 After your physician, physical therapist, or athletic trainer feels your knee has made progress significant enough to begin more advanced exercises, he or she may recommend completing some of the following exercises. Although each person heals at different  rates, most people will be ready for these exercises between 3 and 4 weeks after their injury. Do not begin these exercises until you have your caregiver's permission. He or she may also advise you to continue with the exercises which you completed in Phase I of your rehabilitation. While completing these exercises, remember:   Restoring tissue flexibility helps normal motion to return to the joints. This allows healthier, less painful movement and activity.  An effective stretch should be held for at least 30 seconds.  A stretch should never be painful. You should only feel a gentle lengthening or release in the stretched tissue. RANGE OF MOTION - Ankle Plantar Flexion   Sit with your right / left leg crossed over your opposite knee.  Use your opposite hand to pull the top of your foot and toes toward you.  You should feel a gentle stretch on the top of your foot/ankle. Hold this position for __________. Repeat __________ times. Complete __________ times per day.  RANGE OF MOTION - Ankle Eversion  Sit with your right / left ankle crossed over your opposite knee.  Grip your foot with your opposite hand, placing your thumb on the top of your foot and your fingers across the bottom of your foot.  Gently push your foot downward with a slight rotation so your littlest toes rise slightly  You should feel a gentle stretch on the inside of your ankle. Hold the stretch for __________ seconds. Repeat __________ times. Complete this exercise __________ times per day.  RANGE OF MOTION - Ankle Inversion  Sit with your right / left ankle crossed over your opposite knee.  Grip  your foot with your opposite hand, placing your thumb on the bottom of your foot and your fingers across the top of your foot.  Gently pull your foot so the smallest toe comes toward you and your thumb pushes the inside of the ball of your foot away from you.  You should feel a gentle stretch on the outside of your ankle. Hold the stretch for __________ seconds. Repeat __________ times. Complete this exercise __________ times per day.  STRETCH - Gastrocsoleus  Sit with your right / left leg extended. Holding onto both ends of a belt or towel, loop it around the ball of your foot.  Keeping your right / left ankle and foot relaxed and your knee straight, pull your foot and ankle toward you using the belt/towel.  You should feel a gentle stretch behind your calf or knee. Hold this position for __________ seconds. Repeat __________ times. Complete this stretch __________ times per day.  RANGE OF MOTION - Ankle Dorsiflexion, Active Assisted  Remove shoes and sit on a chair that is preferably not on a carpeted surface.  Place right / left foot under knee. Extend your opposite leg for support.  Keeping your heel down, slide your right / left foot back toward the chair until you feel a stretch at your ankle or calf. If you do not feel a stretch, slide your bottom forward to the edge of the chair while still keeping your heel down.  Hold this stretch for __________ seconds. Repeat __________ times. Complete this stretch __________ times per day.  STRETCH - Gastroc, Standing   Place hands on wall.  Extend right / left leg and place a folded washcloth under the arch of your foot for support. Keep the front knee somewhat bent.  Slightly point your toes inward on your back foot.  Keeping your right /  left heel on the floor and your knee straight, shift your weight toward the wall, not allowing your back to arch.  You should feel a gentle stretch in the calf. Hold this position for __________  seconds. Repeat __________ times. Complete this stretch __________ times per day. STRETCH - Soleus, Standing  Place hands on wall.  Extend right / left leg and place a folded washcloth under the arch of your foot for support. Keep the front knee somewhat bent.  Slightly point your toes inward on your back foot.  Keep your right / left heel on the floor, bend your back knee, and slightly shift your weight over the back leg so that you feel a gentle stretch deep in your back calf.  Hold this position for __________ seconds. Repeat __________ times. Complete this stretch __________ times per day. STRETCH - Gastrocsoleus, Standing Note: This exercise can place a lot of stress on your foot and ankle. Please complete this exercise only if specifically instructed by your caregiver.   Place the ball of your right / left foot on a step, keeping your other foot firmly on the same step.  Hold on to the wall or a rail for balance.  Slowly lift your other foot, allowing your body weight to press your heel down over the edge of the step.  You should feel a stretch in your right / left calf.  Hold this position for __________ seconds.  Repeat this exercise with a slight bend in your knee. Repeat __________ times. Complete this stretch __________ times per day.  STRENGTHENING EXERCISES - Ankle Sprain, Acute-Phase II Around 3 to 4 weeks after your injury, you may progress to some of these exercises in your rehabilitation program. Do not begin these until you have your caregiver's permission. Although your condition has improved, the Phase I exercises will continue to be helpful and you may continue to complete them. As you complete strengthening exercises, remember:   Strong muscles with good endurance tolerate stress better.  Do the exercises as initially prescribed by your caregiver. Progress slowly with each exercise, gradually increasing the number of repetitions and weight used under his or her  guidance.  You may experience muscle soreness or fatigue, but the pain or discomfort you are trying to eliminate should never worsen during these exercises. If this pain does worsen, stop and make certain you are following the directions exactly. If the pain is still present after adjustments, discontinue the exercise until you can discuss the trouble with your caregiver. STRENGTH - Plantar-flexors, Standing  Stand with your feet shoulder width apart. Steady yourself with a wall or table using as little support as needed.  Keeping your weight evenly spread over the width of your feet, rise up on your toes.*  Hold this position for __________ seconds. Repeat __________ times. Complete this exercise __________ times per day.  *If this is too easy, shift your weight toward your right / left leg until you feel challenged. Ultimately, you may be asked to do this exercise with your right / left foot only. STRENGTH - Dorsiflexors and Plantar-flexors, Heel/toe Walking  Dorsiflexion: Walk on your heels only. Keep your toes as high as possible.  Walk for ____________________ seconds/feet.  Repeat __________ times. Complete __________ times per day.  Plantar flexion: Walk on your toes only. Keep your heels as high as possible.  Walk for ____________________ seconds/feet. Repeat __________ times. Complete __________ times per day.  BALANCE - Tandem Walking  Place your uninjured foot on  a line 2 to 4 inches wide and at least 10 feet long.  Keeping your balance without using anything for extra support, place your right / left heel directly in front of your other foot.  Slowly raise your back foot up, lifting from the heel to the toes, and place it directly in front of the right / left foot.  Continue to walk along the line slowly. Walk for ____________________ feet. Repeat ____________________ times. Complete ____________________ times per day. BALANCE - Inversion/Eversion Use caution, these are  advanced level exercises. Do not begin them until you are advised to do so.   Create a balance board using a sturdy board about 1  feet long and at 1 to 1  feet wide and a 1  inch diameter rod or pipe that is as long as the board's width. A copper pipe or a solid broomstick work well.  Stand on a non-carpeted surface near a countertop or wall. Step onto the board so that your feet are hip-width apart and equally straddle the rod/pipe.  Keeping your feet in place, complete these two exercises without shifting your upper body or hips:  Tip the board from side-to-side. Control the movement so the board does not forcefully strike the ground. The board should silently tap the ground.  Tip the board side-to-side without striking the ground. Occasionally pause and maintain a steady position at various points.  Repeat the first two exercises, but use only your right / left foot. Place your right / left foot directly over the rod/pipe. Repeat __________ times. Complete this exercise __________ times a day. BALANCE - Plantar/Dorsi Flexion Use caution, these are advanced level exercises. Do not begin them until you are advised to do so.   Create a balance board using a sturdy board about 1  feet long and at 1 to 1  feet wide and a 1  inch diameter rod or pipe that is as long as the board's width. A copper pipe or a solid broomstick work well.  Stand on a non-carpeted surface near a countertop or wall. Stand on the board so that the rod/pipe runs under the arches in your feet.  Keeping your feet in place, complete these two exercises without shifting your upper body or hips:  Tip the board from side-to-side. Control the movement so the board does not forcefully strike the ground. The board should silently tap the ground.  Tip the board side-to-side without striking the ground. Occasionally pause and maintain a steady position at various points.  Repeat the first two exercises, but use only your  right / left foot. Stand in the center of the board. Repeat __________ times. Complete this exercise __________ times a day. STRENGTH - Plantar-flexors, Eccentric Note: This exercise can place a lot of stress on your foot and ankle. Please complete this exercise only if specifically instructed by your caregiver.   Place the balls of your feet on a step. With your hands, use only enough support from a wall or rail to keep your balance.  Keep your knees straight and rise up on your toes.  Slowly shift your weight entirely to your toes and pick up your opposite foot. Gently and with controlled movement, lower your weight through your right / left foot so that your heel drops below the level of the step. You will feel a slight stretch in the back of your calf at the ending position.  Use the healthy leg to help rise up onto the balls  of both feet, then lower weight only on the right / left leg again. Build up to 15 repetitions. Then progress to 3 consecutive sets of 15 repetitions.*  After completing the above exercise, complete the same exercise with a slight knee bend (about 30 degrees). Again, build up to 15 repetitions. Then progress to 3 consecutive sets of 15 repetitions.* Perform this exercise __________ times per day.  *When you easily complete 3 sets of 15, your physician, physical therapist, or athletic trainer may advise you to add resistance by wearing a backpack filled with additional weight. Document Released: 03/02/2006 Document Revised: 02/02/2012 Document Reviewed: 02/22/2009 [facility] Patient Information 2015 [place], [place]. This information is not intended to replace advice given to you by your health care provider. Make sure you discuss any questions you have with your health care provider.  Repeat __________ times. Complete this exercise __________ times per day. Document Released: 06/11/2005 Document Revised: 03/27/2014 Document Reviewed: 02/22/2009 [facility] Patient  Information 2015 [place], [place]. This information is not intended to replace advice given to you by your health care provider. Make sure you discuss any questions you have with your health care provider.

## 2015-03-06 NOTE — Progress Notes (Signed)
Pre visit review using our clinic review tool, if applicable. No additional management support is needed unless otherwise documented below in the visit note.  Chief Complaint  Patient presents with  . Medicare Wellness    cdm ht lipids     HPI: Nicole [name] 69 y.o. comes in today for Preventive Medicare wellness visit . And Chronic disease management  Sprained ankle  So less exercise .  With that  Saw ortho .  Mc Sonda Primes off step in step class was sick at the time.   Lovastatin ok   Gyne   No sys has double  mastectomy and  BSO No sx  At thsi time except dryness  Gyne office tole her she would have to pay up front and well women exam not covered but insurance  Has recurrent " Zoster" right hip take me as needed  Health Maintenance  Topic Date Due  . ZOSTAVAX  07/01/2007  . INFLUENZA VACCINE  06/25/2015  . COLONOSCOPY  06/18/2021  . TETANUS/TDAP  04/14/2024  . DEXA SCAN  Completed  . PNA vac Low Risk Adult  Completed   Health Maintenance Review LIFESTYLE:  Exercise:   Good except recnet injury  Tobacco/ETS:no Alcohol: 6 per week  Sugar beverages: no limits  Sleep: 7-9  Drug use: no Bone density:  2015  Colonoscopy: utd MEDICARE DOCUMENT QUESTIONS  TO SCAN    Hearing: ok  Vision:  No limitations at present . Last eye check UTD  Safety:  Has smoke detector and wears seat belts.  No firearms. No excess sun exposure. Sees dentist regularly.  Falls: not really  Except above injury   Advance directive :  Reviewed  Yes  Has one.  Memory: Felt to be good  , no concern from her or her family.  Depression: No anhedonia unusual crying or depressive symptoms  Nutrition: Eats well balanced diet; adequate calcium and vitamin D. No swallowing chewing problems.  Injury: no major injuries in the last six months.  Other healthcare providers:  Reviewed today .  Social:  Lives with spouse married 2 . No pets.   Preventive parameters: up-to-date  Reviewed    ADLS:   There are no problems or need for assistance  driving, feeding, obtaining food, dressing, toileting and bathing, managing money using phone. She is independent.    ROS:   Has scolisois  Dr Allena Katz  And altering exercising  GEN/ HEENT: No fever, significant weight changes sweats headaches vision problems hearing changes, CV/ PULM; No chest pain shortness of breath cough, syncope,edema  change in exercise tolerance. GI /GU: No adominal pain, vomiting, change in bowel habits. No blood in the stool. No significant GU symptoms. SKIN/HEME: ,no acute skin rashes suspicious lesions or bleeding. No lymphadenopathy, nodules, masses.  NEURO/ PSYCH:  No neurologic signs such as weakness numbness. No depression anxiety. IMM/ Allergy: No unusual infections.  Allergy .   REST of 12 system review negative except as per HPI   Past Medical History  Diagnosis Date  . Hx of breast cancer   . Headache(784.0)   . Rheumatoid factor positive     OA   had rheum consult  . GERD (gastroesophageal reflux disease)   . Hypercholesteremia   . Breast cancer     Left- Ductal ca SR-, Chemo (Magrinat) rad rx   . BRCA1 positive     Family History  Problem Relation Age of Onset  . Arthritis      maternal side RA  cousin  . Kidney disease      maternal side  . Diabetes      1st degree relative, paternal side  . Diabetes Mother   . Hypertension Mother   . Heart disease Mother   . Diabetes Father   . Lung cancer Father     History   Social History  . Marital Status: Married    Spouse Name: N/A  . Number of Children: N/A  . Years of Education: N/A   Social History Main Topics  . Smoking status: Former Research scientist (life sciences)  . Smokeless tobacco: Never Used  . Alcohol Use: 0.0 oz/week    0 Standard drinks or equivalent per week     Comment: occ  . Drug Use: No  . Sexual Activity: Not on file   Other Topics Concern  . None   Social History Narrative   Married, household of 2. Retired Conservation officer, historic buildings.  Regular exercise 30 min 5 days/week. Designated Party release signed 04/17/10.      Husband with final stage prostate cancer at this time.   hh of 2     Outpatient Encounter Prescriptions as of 03/06/2015  Medication Sig  . acyclovir (ZOVIRAX) 200 MG capsule Take 2 capsules (400 mg total) by mouth 2 (two) times daily. Prn outbreak  . acyclovir ointment (ZOVIRAX) 5 % Apply 1 application topically 3 (three) times daily as needed.  Marland Kitchen albuterol (PROVENTIL HFA;VENTOLIN HFA) 108 (90 BASE) MCG/ACT inhaler Inhale 2 puffs into the lungs every 4 (four) hours as needed for wheezing or shortness of breath.  Marland Kitchen aspirin 325 MG EC tablet Take 325 mg by mouth at bedtime as needed for pain.  . calcium carbonate (OS-CAL) 600 MG TABS Take 600 mg by mouth 3 (three) times daily with meals.    . fish oil-omega-3 fatty acids 1000 MG capsule Take 1 g by mouth daily.   Marland Kitchen HYDROcodone-homatropine (HYDROMET) 5-1.5 MG/5ML syrup Take 5 mLs by mouth every 4 (four) hours as needed.  . lovastatin (MEVACOR) 20 MG tablet TAKE 1 TABLET BY MOUTH ONCE DAILY  . Magnesium 300 MG CAPS Take 1 capsule by mouth daily.   . ranitidine (ZANTAC) 150 MG tablet TAKE 1 TABLET BY MOUTH TWO TIMES A DAY  . Vitamin D, Cholecalciferol, 1000 UNITS CAPS Take by mouth.  . [DISCONTINUED] amoxicillin-clavulanate (AUGMENTIN) 875-125 MG per tablet Take 1 tablet by mouth 2 (two) times daily.  . [DISCONTINUED] Vitamin D, Ergocalciferol, (DRISDOL) 50000 UNITS CAPS capsule Take one capsule once every 2 weeks    EXAM:  BP 142/80 mmHg  Temp(Src) 98.4 F (36.9 C) (Oral)  Ht _0  (1.575 m)  Wt 138 lb (62.596 kg)  BMI 25.23 kg/m2  Body mass index is 25.23 kg/(m^2).  Physical Exam: Vital signs reviewed EUM:PNTI is a well-developed well-nourished alert cooperative   who appears stated age in no acute distress.  HEENT: normocephalic atraumatic , Eyes: PERRL EOM's full, conjunctiva clear, Nares: paten,t no deformity discharge or tenderness., Ears: no  deformity EAC's clear TMs with normal landmarks. Mouth: clear OP, no lesions, edema.  Moist mucous membranes. Dentition in adequate repair. NECK: supple without masses, thyromegaly or bruits. CHEST/PULM:  Clear to auscultation and percussion breath sounds equal no wheeze , rales or rhonchi. No chest wall deformities or tenderness. Breast absent surgically and no nodules adenopathy CV: PMI is nondisplaced, S1 S2 no gallops, murmurs, rubs. Peripheral pulses are full without delay.No JVD .  ABDOMEN: Bowel sounds normal nontender  No guard or rebound, no  hepato splenomegal no CVA tenderness.  No hernia. Extremtities:  No clubbing cyanosis or edema, no acute joint swelling or redness no focal atrophy right ankle mild swelling lateral malleolus  Nl rom  NEURO:  Oriented x3, cranial nerves 3-12 appear to be intact, no obvious focal weakness,gait within normal limits no abnormal reflexes or asymmetrical SKIN: No acute rashes normal turgor, color, no bruising or petechiae. PSYCH: Oriented, good eye contact, no obvious depression anxiety, cognition and judgment appear normal. LN: no cervical axillary inguinal adenopathy No noted deficits in memory, attention, and speech.  ASSESSMENT AND PLAN:  Discussed the following assessment and plan:  Visit for preventive health examination - Plan: Vitamin D, Cholecalciferol, 1000 UNITS CAPS, Basic metabolic panel, CBC with Differential/Platelet, Hepatic function panel, Lipid panel, TSH, POCT urinalysis dipstick  Medicare annual wellness visit, subsequent - Plan: Vitamin D, Cholecalciferol, 1000 UNITS CAPS  Mixed hyperlipidemia - med monitoring - Plan: Vitamin D, Cholecalciferol, 1000 UNITS CAPS, Basic metabolic panel, CBC with Differential/Platelet, Hepatic function panel, Lipid panel, TSH, POCT urinalysis dipstick  Personal history of malignant neoplasm of breast - Plan: Vitamin D, Cholecalciferol, 1000 UNITS CAPS  ELEVATED BP READING WITHOUT DX HYPERTENSION -  ensure that at goal per pat is ok usually  - Plan: Basic metabolic panel, CBC with Differential/Platelet, Hepatic function panel, Lipid panel, TSH, POCT urinalysis dipstick  Encounter for immunization  BRCA1 positive  Sprain of ankle, right, sequela - revewiwed care exercise and fu if needed  Disc pelvic exam not to do today and no sx .  decline inspection today  By review has a uterus and no ovary or tubes Patient Care Team: Burnis Medin, MD as PCP - General Bo Merino, MD (Rheumatology) Ronald Lobo, MD (Gastroenterology) Phylliss Bob, MD as Consulting Physician (Orthopedic Surgery) Milly Jakob, MD as Consulting Physician (Orthopedic Surgery) Marygrace Drought, MD as Consulting Physician (Ophthalmology) Elveria Rising, MD as Consulting Physician (Obstetrics and Gynecology)  Patient Instructions  Continue lifestyle intervention healthy eating and exercise . Ice after activity can take 6-8 weeks  To recover from ankle sprain . Check into shingles vaccine ( Zostavax) reimbursement or cost to you  and can return at any time if call ahead for injection.   Take blood pressure readings twice a day for 7- 10 days and then periodically .To ensure below 140/90   .OV if not at goal .    Healthy lifestyle includes : At least 150 minutes of exercise weeks  , weight at healthy levels, which is usually   BMI 19-25. Avoid trans fats and processed foods;  Increase fresh fruits and veges to 5 servings per day. And avoid sweet beverages including tea and juice. Mediterranean diet with olive oil and nuts have been noted to be heart and brain healthy . Avoid tobacco products . Limit  alcohol to  7 per week for women and 14 servings for men.  Get adequate sleep . Wear seat belts . Don't text and drive .    Acute Ankle Sprain with Phase I Rehab An acute ankle sprain is a partial or complete tear in one or more of the ligaments of the ankle due to traumatic injury. The severity of the  injury depends on both the number of ligaments sprained and the grade of sprain. There are 3 grades of sprains.   A grade 1 sprain is a mild sprain. There is a slight pull without obvious tearing. There is no loss of strength, and the muscle and ligament are the correct  length.  A grade 2 sprain is a moderate sprain. There is tearing of fibers within the substance of the ligament where it connects two bones or two cartilages. The length of the ligament is increased, and there is usually decreased strength.  A grade 3 sprain is a complete rupture of the ligament and is uncommon. In addition to the grade of sprain, there are three types of ankle sprains.  Lateral ankle sprains: This is a sprain of one or more of the three ligaments on the outer side (lateral) of the ankle. These are the most common sprains. Medial ankle sprains: There is one large triangular ligament of the inner side (medial) of the ankle that is susceptible to injury. Medial ankle sprains are less common. Syndesmosis, "high ankle," sprains: The syndesmosis is the ligament that connects the two bones of the lower leg. Syndesmosis sprains usually only occur with very severe ankle sprains. SYMPTOMS  Pain, tenderness, and swelling in the ankle, starting at the side of injury that may progress to the whole ankle and foot with time.  "Pop" or tearing sensation at the time of injury.  Bruising that may spread to the heel.  Impaired ability to walk soon after injury. CAUSES   Acute ankle sprains are caused by trauma placed on the ankle that temporarily forces or pries the anklebone (talus) out of its normal socket.  Stretching or tearing of the ligaments that normally hold the joint in place (usually due to a twisting injury). RISK INCREASES WITH:  Previous ankle sprain.  Sports in which the foot may land awkwardly (i.e., basketball, volleyball, or soccer) or walking or running on uneven or rough surfaces.  Shoes with  inadequate support to prevent sideways motion when stress occurs.  Poor strength and flexibility.  Poor balance skills.  Contact sports. PREVENTION   Warm up and stretch properly before activity.  Maintain physical fitness:  Ankle and leg flexibility, muscle strength, and endurance.  Cardiovascular fitness.  Balance training activities.  Use proper technique and have a coach correct improper technique.  Taping, protective strapping, bracing, or high-top tennis shoes may help prevent injury. Initially, tape is best; however, it loses most of its support function within 10 to 15 minutes.  Wear proper-fitted protective shoes (High-top shoes with taping or bracing is more effective than either alone).  Provide the ankle with support during sports and practice activities for 12 months following injury. PROGNOSIS   If treated properly, ankle sprains can be expected to recover completely; however, the length of recovery depends on the degree of injury.  A grade 1 sprain usually heals enough in 5 to 7 days to allow modified activity and requires an average of 6 weeks to heal completely.  A grade 2 sprain requires 6 to 10 weeks to heal completely.  A grade 3 sprain requires 12 to 16 weeks to heal.  A syndesmosis sprain often takes more than 3 months to heal. RELATED COMPLICATIONS   Frequent recurrence of symptoms may result in a chronic problem. Appropriately addressing the problem the first time decreases the frequency of recurrence and optimizes healing time. Severity of the initial sprain does not predict the likelihood of later instability.  Injury to other structures (bone, cartilage, or tendon).  A chronically unstable or arthritic ankle joint is a possibility with repeated sprains. TREATMENT Treatment initially involves the use of ice, medication, and compression bandages to help reduce pain and inflammation. Ankle sprains are usually immobilized in a walking cast or boot  to allow for healing. Crutches may be recommended to reduce pressure on the injury. After immobilization, strengthening and stretching exercises may be necessary to regain strength and a full range of motion. Surgery is rarely needed to treat ankle sprains. MEDICATION   Nonsteroidal anti-inflammatory medications, such as aspirin and ibuprofen (do not take for the first 3 days after injury or within 7 days before surgery), or other minor pain relievers, such as acetaminophen, are often recommended. Take these as directed by your caregiver. Contact your caregiver immediately if any bleeding, stomach upset, or signs of an allergic reaction occur from these medications.  Ointments applied to the skin may be helpful.  Pain relievers may be prescribed as necessary by your caregiver. Do not take prescription pain medication for longer than 4 to 7 days. Use only as directed and only as much as you need. HEAT AND COLD  Cold treatment (icing) is used to relieve pain and reduce inflammation for acute and chronic cases. Cold should be applied for 10 to 15 minutes every 2 to 3 hours for inflammation and pain and immediately after any activity that aggravates your symptoms. Use ice packs or an ice massage.  Heat treatment may be used before performing stretching and strengthening activities prescribed by your caregiver. Use a heat pack or a warm soak. SEEK IMMEDIATE MEDICAL CARE IF:   Pain, swelling, or bruising worsens despite treatment.  You experience pain, numbness, discoloration, or coldness in the foot or toes.  New, unexplained symptoms develop (drugs used in treatment may produce side effects.) EXERCISES  PHASE I EXERCISES RANGE OF MOTION (ROM) AND STRETCHING EXERCISES - Ankle Sprain, Acute Phase I, Weeks 1 to 2 These exercises may help you when beginning to restore flexibility in your ankle. You will likely work on these exercises for the 1 to 2 weeks after your injury. Once your physician,  physical therapist, or athletic trainer sees adequate progress, he or she will advance your exercises. While completing these exercises, remember:   Restoring tissue flexibility helps normal motion to return to the joints. This allows healthier, less painful movement and activity.  An effective stretch should be held for at least 30 seconds.  A stretch should never be painful. You should only feel a gentle lengthening or release in the stretched tissue. RANGE OF MOTION - Dorsi/Plantar Flexion  While sitting with your right / left knee straight, draw the top of your foot upwards by flexing your ankle. Then reverse the motion, pointing your toes downward.  Hold each position for __________ seconds.  After completing your first set of exercises, repeat this exercise with your knee bent. Repeat __________ times. Complete this exercise __________ times per day.  RANGE OF MOTION - Ankle Alphabet  Imagine your right / left big toe is a pen.  Keeping your hip and knee still, write out the entire alphabet with your "pen." Make the letters as large as you can without increasing any discomfort. Repeat __________ times. Complete this exercise __________ times per day.  STRENGTHENING EXERCISES - Ankle Sprain, Acute -Phase I, Weeks 1 to 2 These exercises may help you when beginning to restore strength in your ankle. You will likely work on these exercises for 1 to 2 weeks after your injury. Once your physician, physical therapist, or athletic trainer sees adequate progress, he or she will advance your exercises. While completing these exercises, remember:   Muscles can gain both the endurance and the strength needed for everyday activities through controlled exercises.  Complete  these exercises as instructed by your physician, physical therapist, or athletic trainer. Progress the resistance and repetitions only as guided.  You may experience muscle soreness or fatigue, but the pain or discomfort you  are trying to eliminate should never worsen during these exercises. If this pain does worsen, stop and make certain you are following the directions exactly. If the pain is still present after adjustments, discontinue the exercise until you can discuss the trouble with your clinician. STRENGTH - Dorsiflexors  Secure a rubber exercise band/tubing to a fixed object (i.e., table, pole) and loop the other end around your right / left foot.  Sit on the floor facing the fixed object. The band/tubing should be slightly tense when your foot is relaxed.  Slowly draw your foot back toward you using your ankle and toes.  Hold this position for __________ seconds. Slowly release the tension in the band and return your foot to the starting position. Repeat __________ times. Complete this exercise __________ times per day.  STRENGTH - Plantar-flexors   Sit with your right / left leg extended. Holding onto both ends of a rubber exercise band/tubing, loop it around the ball of your foot. Keep a slight tension in the band.  Slowly push your toes away from you, pointing them downward.  Hold this position for __________ seconds. Return slowly, controlling the tension in the band/tubing. Repeat __________ times. Complete this exercise __________ times per day.  STRENGTH - Ankle Eversion  Secure one end of a rubber exercise band/tubing to a fixed object (table, pole). Loop the other end around your foot just before your toes.  Place your fists between your knees. This will focus your strengthening at your ankle.  Drawing the band/tubing across your opposite foot, slowly, pull your little toe out and up. Make sure the band/tubing is positioned to resist the entire motion.  Hold this position for __________ seconds. Have your muscles resist the band/tubing as it slowly pulls your foot back to the starting position.  Repeat __________ times. Complete this exercise __________ times per day.  STRENGTH - Ankle  Inversion  Secure one end of a rubber exercise band/tubing to a fixed object (table, pole). Loop the other end around your foot just before your toes.  Place your fists between your knees. This will focus your strengthening at your ankle.  Slowly, pull your big toe up and in, making sure the band/tubing is positioned to resist the entire motion.  Hold this position for __________ seconds.  Have your muscles resist the band/tubing as it slowly pulls your foot back to the starting position. Repeat __________ times. Complete this exercises __________ times per day.  STRENGTH - Towel Curls  Sit in a chair positioned on a non-carpeted surface.  Place your right / left foot on a towel, keeping your heel on the floor.  Pull the towel toward your heel by only curling your toes. Keep your heel on the floor.  If instructed by your physician, physical therapist, or athletic trainer, add weight to the end of the towel.  Acute Ankle Sprain with Phase II Rehab An acute ankle sprain is a partial or complete tear in one or more of the ligaments of the ankle due to traumatic injury. The severity of the injury depends on both the number of ligaments sprained and the grade of sprain. There are 3 grades of sprains.  A grade 1 sprain is a mild sprain. There is a slight pull without obvious tearing. There is no  loss of strength, and the muscle and ligament are the correct length.  A grade 2 sprain is a moderate sprain. There is tearing of fibers within the substance of the ligament where it connects two bones or two cartilages. The length of the ligament is increased, and there is usually decreased strength.  A grade 3 sprain is a complete rupture of the ligament and is uncommon. In addition to the grade of sprain, there are 3 types of ankle sprains.  Lateral ankle sprains. This is a sprain of one or more of the 3 ligaments on the outer side (lateral) of the ankle. These are the most common  sprains. Medial ankle sprains. There is one large triangular ligament on the inner side (medial) of the ankle that is susceptible to injury. Medial ankle sprains are less common. Syndesmosis, "high ankle," sprains. The syndesmosis is the ligament that connects the two bones of the lower leg. Syndesmosis sprains usually only occur with very severe ankle sprains. SYMPTOMS  Pain, tenderness, and swelling in the ankle, starting at the side of injury that may progress to the whole ankle and foot with time.  "Pop" or tearing sensation at the time of injury.  Bruising that may spread to the heel.  Impaired ability to walk soon after injury. CAUSES   Acute ankle sprains are caused by trauma placed on the ankle that temporarily forces or pries the anklebone (talus) out of its normal socket.  Stretching or tearing of the ligaments that normally hold the joint in place (usually due to a twisting injury). RISK INCREASES WITH:  Previous ankle sprain.  Sports in which the foot may land awkwardly (basketball, volleyball, soccer) or walking or running on uneven or rough surfaces.  Shoes with inadequate support to prevent sideways motion when stress occurs.  Poor strength and flexibility.  Poor balance skills.  Contact sports. PREVENTION  Warm up and stretch properly before activity.  Maintain physical fitness:  Ankle and leg flexibility, muscle strength, and endurance.  Cardiovascular fitness.  Balance training activities.  Use proper technique and have a coach correct improper technique.  Taping, protective strapping, bracing, or high-top tennis shoes may help prevent injury. Initially, tape is best. However, it loses most of its support function within 10 to 15 minutes.  Wear proper fitted protective shoes. Combining high-top shoes with taping or bracing is more effective than using either alone.  Provide the ankle with support during sports and practice activities for 12 months  following injury. PROGNOSIS   If treated properly, ankle sprains can be expected to recover completely. However, the length of recovery depends on the degree of injury.  A grade 1 sprain usually heals enough in 5 to 7 days to allow modified activity and requires an average of 6 weeks to heal completely.  A grade 2 sprain requires 6 to 10 weeks to heal completely.  A grade 3 sprain requires 12 to 16 weeks to heal.  A syndesmosis sprain often takes more than 3 months to heal. RELATED COMPLICATIONS   Frequent recurrence of symptoms may result in a chronic problem. Appropriately addressing the problem the first time decreases the frequency of recurrence and optimizes healing time. Severity of initial sprain does not predict the likelihood of later instability.  Injury to other structures (bone, cartilage, or tendon).  Chronically unstable or arthritic ankle joint are possible with repeated sprains. TREATMENT Treatment initially involves the use of ice, medicine, and compression bandages to help reduce pain and inflammation. Ankle sprains are  usually immobilized in a walking cast or boot to allow for healing. Crutches may be recommended to reduce pressure on the injury. After immobilization, strengthening and stretching exercises may be necessary to regain strength and a full range of motion. Surgery is rarely needed to treat ankle sprains. MEDICATION   Nonsteroidal anti-inflammatory medicines, such as aspirin and ibuprofen (do not take for the first 3 days after injury or within 7 days before surgery), or other minor pain relievers, such as acetaminophen, are often recommended. Take these as directed by your caregiver. Contact your caregiver immediately if any bleeding, stomach upset, or signs of an allergic reaction occur from these medicines.  Ointments applied to the skin may be helpful.  Pain relievers may be prescribed as necessary by your caregiver. Do not take prescription pain medicine  for longer than 4 to 7 days. Use only as directed and only as much as you need. HEAT AND COLD  Cold treatment (icing) is used to relieve pain and reduce inflammation for acute and chronic cases. Cold should be applied for 10 to 15 minutes every 2 to 3 hours for inflammation and pain and immediately after any activity that aggravates your symptoms. Use ice packs or an ice massage.  Heat treatment may be used before performing stretching and strengthening activities prescribed by your caregiver. Use a heat pack or a warm soak. SEEK IMMEDIATE MEDICAL CARE IF:   Pain, swelling, or bruising worsens despite treatment.  You experience pain, numbness, discoloration, or coldness in the foot or toes.  New, unexplained symptoms develop. (Drugs used in treatment may produce side effects.) EXERCISES  PHASE II EXERCISES RANGE OF MOTION (ROM) AND STRETCHING EXERCISES - Ankle Sprain, Acute-Phase II, Weeks 3 to 4 After your physician, physical therapist, or athletic trainer feels your knee has made progress significant enough to begin more advanced exercises, he or she may recommend completing some of the following exercises. Although each person heals at different rates, most people will be ready for these exercises between 3 and 4 weeks after their injury. Do not begin these exercises until you have your caregiver's permission. He or she may also advise you to continue with the exercises which you completed in Phase I of your rehabilitation. While completing these exercises, remember:   Restoring tissue flexibility helps normal motion to return to the joints. This allows healthier, less painful movement and activity.  An effective stretch should be held for at least 30 seconds.  A stretch should never be painful. You should only feel a gentle lengthening or release in the stretched tissue. RANGE OF MOTION - Ankle Plantar Flexion   Sit with your right / left leg crossed over your opposite knee.  Use your  opposite hand to pull the top of your foot and toes toward you.  You should feel a gentle stretch on the top of your foot/ankle. Hold this position for __________. Repeat __________ times. Complete __________ times per day.  RANGE OF MOTION - Ankle Eversion  Sit with your right / left ankle crossed over your opposite knee.  Grip your foot with your opposite hand, placing your thumb on the top of your foot and your fingers across the bottom of your foot.  Gently push your foot downward with a slight rotation so your littlest toes rise slightly  You should feel a gentle stretch on the inside of your ankle. Hold the stretch for __________ seconds. Repeat __________ times. Complete this exercise __________ times per day.  RANGE OF MOTION -  Ankle Inversion  Sit with your right / left ankle crossed over your opposite knee.  Grip your foot with your opposite hand, placing your thumb on the bottom of your foot and your fingers across the top of your foot.  Gently pull your foot so the smallest toe comes toward you and your thumb pushes the inside of the ball of your foot away from you.  You should feel a gentle stretch on the outside of your ankle. Hold the stretch for __________ seconds. Repeat __________ times. Complete this exercise __________ times per day.  STRETCH - Gastrocsoleus  Sit with your right / left leg extended. Holding onto both ends of a belt or towel, loop it around the ball of your foot.  Keeping your right / left ankle and foot relaxed and your knee straight, pull your foot and ankle toward you using the belt/towel.  You should feel a gentle stretch behind your calf or knee. Hold this position for __________ seconds. Repeat __________ times. Complete this stretch __________ times per day.  RANGE OF MOTION - Ankle Dorsiflexion, Active Assisted  Remove shoes and sit on a chair that is preferably not on a carpeted surface.  Place right / left foot under knee. Extend your  opposite leg for support.  Keeping your heel down, slide your right / left foot back toward the chair until you feel a stretch at your ankle or calf. If you do not feel a stretch, slide your bottom forward to the edge of the chair while still keeping your heel down.  Hold this stretch for __________ seconds. Repeat __________ times. Complete this stretch __________ times per day.  STRETCH - Gastroc, Standing   Place hands on wall.  Extend right / left leg and place a folded washcloth under the arch of your foot for support. Keep the front knee somewhat bent.  Slightly point your toes inward on your back foot.  Keeping your right / left heel on the floor and your knee straight, shift your weight toward the wall, not allowing your back to arch.  You should feel a gentle stretch in the calf. Hold this position for __________ seconds. Repeat __________ times. Complete this stretch __________ times per day. STRETCH - Soleus, Standing  Place hands on wall.  Extend right / left leg and place a folded washcloth under the arch of your foot for support. Keep the front knee somewhat bent.  Slightly point your toes inward on your back foot.  Keep your right / left heel on the floor, bend your back knee, and slightly shift your weight over the back leg so that you feel a gentle stretch deep in your back calf.  Hold this position for __________ seconds. Repeat __________ times. Complete this stretch __________ times per day. STRETCH - Gastrocsoleus, Standing Note: This exercise can place a lot of stress on your foot and ankle. Please complete this exercise only if specifically instructed by your caregiver.   Place the ball of your right / left foot on a step, keeping your other foot firmly on the same step.  Hold on to the wall or a rail for balance.  Slowly lift your other foot, allowing your body weight to press your heel down over the edge of the step.  You should feel a stretch in your  right / left calf.  Hold this position for __________ seconds.  Repeat this exercise with a slight bend in your knee. Repeat __________ times. Complete this stretch __________  times per day.  STRENGTHENING EXERCISES - Ankle Sprain, Acute-Phase II Around 3 to 4 weeks after your injury, you may progress to some of these exercises in your rehabilitation program. Do not begin these until you have your caregiver's permission. Although your condition has improved, the Phase I exercises will continue to be helpful and you may continue to complete them. As you complete strengthening exercises, remember:   Strong muscles with good endurance tolerate stress better.  Do the exercises as initially prescribed by your caregiver. Progress slowly with each exercise, gradually increasing the number of repetitions and weight used under his or her guidance.  You may experience muscle soreness or fatigue, but the pain or discomfort you are trying to eliminate should never worsen during these exercises. If this pain does worsen, stop and make certain you are following the directions exactly. If the pain is still present after adjustments, discontinue the exercise until you can discuss the trouble with your caregiver. STRENGTH - Plantar-flexors, Standing  Stand with your feet shoulder width apart. Steady yourself with a wall or table using as little support as needed.  Keeping your weight evenly spread over the width of your feet, rise up on your toes.*  Hold this position for __________ seconds. Repeat __________ times. Complete this exercise __________ times per day.  *If this is too easy, shift your weight toward your right / left leg until you feel challenged. Ultimately, you may be asked to do this exercise with your right / left foot only. STRENGTH - Dorsiflexors and Plantar-flexors, Heel/toe Walking  Dorsiflexion: Walk on your heels only. Keep your toes as high as possible.  Walk for ____________________  seconds/feet.  Repeat __________ times. Complete __________ times per day.  Plantar flexion: Walk on your toes only. Keep your heels as high as possible.  Walk for ____________________ seconds/feet. Repeat __________ times. Complete __________ times per day.  BALANCE - Tandem Walking  Place your uninjured foot on a line 2 to 4 inches wide and at least 10 feet long.  Keeping your balance without using anything for extra support, place your right / left heel directly in front of your other foot.  Slowly raise your back foot up, lifting from the heel to the toes, and place it directly in front of the right / left foot.  Continue to walk along the line slowly. Walk for ____________________ feet. Repeat ____________________ times. Complete ____________________ times per day. BALANCE - Inversion/Eversion Use caution, these are advanced level exercises. Do not begin them until you are advised to do so.   Create a balance board using a sturdy board about 1  feet long and at 1 to 1  feet wide and a 1  inch diameter rod or pipe that is as long as the board's width. A copper pipe or a solid broomstick work well.  Stand on a non-carpeted surface near a countertop or wall. Step onto the board so that your feet are hip-width apart and equally straddle the rod/pipe.  Keeping your feet in place, complete these two exercises without shifting your upper body or hips:  Tip the board from side-to-side. Control the movement so the board does not forcefully strike the ground. The board should silently tap the ground.  Tip the board side-to-side without striking the ground. Occasionally pause and maintain a steady position at various points.  Repeat the first two exercises, but use only your right / left foot. Place your right / left foot directly over the rod/pipe. Repeat  __________ times. Complete this exercise __________ times a day. BALANCE - Plantar/Dorsi Flexion Use caution, these are advanced  level exercises. Do not begin them until you are advised to do so.   Create a balance board using a sturdy board about 1  feet long and at 1 to 1  feet wide and a 1  inch diameter rod or pipe that is as long as the board's width. A copper pipe or a solid broomstick work well.  Stand on a non-carpeted surface near a countertop or wall. Stand on the board so that the rod/pipe runs under the arches in your feet.  Keeping your feet in place, complete these two exercises without shifting your upper body or hips:  Tip the board from side-to-side. Control the movement so the board does not forcefully strike the ground. The board should silently tap the ground.  Tip the board side-to-side without striking the ground. Occasionally pause and maintain a steady position at various points.  Repeat the first two exercises, but use only your right / left foot. Stand in the center of the board. Repeat __________ times. Complete this exercise __________ times a day. STRENGTH - Plantar-flexors, Eccentric Note: This exercise can place a lot of stress on your foot and ankle. Please complete this exercise only if specifically instructed by your caregiver.   Place the balls of your feet on a step. With your hands, use only enough support from a wall or rail to keep your balance.  Keep your knees straight and rise up on your toes.  Slowly shift your weight entirely to your toes and pick up your opposite foot. Gently and with controlled movement, lower your weight through your right / left foot so that your heel drops below the level of the step. You will feel a slight stretch in the back of your calf at the ending position.  Use the healthy leg to help rise up onto the balls of both feet, then lower weight only on the right / left leg again. Build up to 15 repetitions. Then progress to 3 consecutive sets of 15 repetitions.*  After completing the above exercise, complete the same exercise with a slight knee bend  (about 30 degrees). Again, build up to 15 repetitions. Then progress to 3 consecutive sets of 15 repetitions.* Perform this exercise __________ times per day.  *When you easily complete 3 sets of 15, your physician, physical therapist, or athletic trainer may advise you to add resistance by wearing a backpack filled with additional weight. Document Released: 03/02/2006 Document Revised: 02/02/2012 Document Reviewed: 02/22/2009 [facility] Patient Information 2015 [place], [place]. This information is not intended to replace advice given to you by your health care provider. Make sure you discuss any questions you have with your health care provider.  Repeat __________ times. Complete this exercise __________ times per day. Document Released: 06/11/2005 Document Revised: 03/27/2014 Document Reviewed: 02/22/2009 [facility] Patient Information 2015 [place], [place]. This information is not intended to replace advice given to you by your health care provider. Make sure you discuss any questions you have with your health care provider.     [name]. Karley Pho M.D.

## 2015-03-30 ENCOUNTER — Telehealth: Payer: Self-pay | Admitting: Internal Medicine

## 2015-03-30 DIAGNOSIS — B001 Herpesviral vesicular dermatitis: Secondary | ICD-10-CM

## 2015-03-30 MED ORDER — ACYCLOVIR 200 MG PO CAPS: 400.0000 mg | ORAL_CAPSULE | Freq: Two times a day (BID) | ORAL | Status: DC

## 2015-03-30 MED ORDER — ACYCLOVIR 5 % EX OINT: 1.0000 "application " | TOPICAL_OINTMENT | Freq: Three times a day (TID) | CUTANEOUS | Status: DC | PRN

## 2015-03-30 MED ORDER — LOVASTATIN 20 MG PO TABS: 20.0000 mg | ORAL_TABLET | Freq: Every day | ORAL | Status: DC

## 2015-03-30 NOTE — Telephone Encounter (Signed)
Sent to the pharmacy.  Pt stated she uses PRN for fever blister outbreak.  Does not use everyday.

## 2015-03-30 NOTE — Telephone Encounter (Signed)
  okt o   Fill new under [place]  but need dx and sig of how she is using the acyclovir  Ok to refil lovastatin for 1 year.

## 2015-03-30 NOTE — Telephone Encounter (Signed)
Pt needs new rxs acyclovir 200 mg #90 and acyclovir 5% ointment call into [facility]. These meds  was last prescribed by pt gyn. Pt also needs lovastatin 20 mg #90 call in [facility]

## 2015-04-05 ENCOUNTER — Telehealth: Payer: Self-pay | Admitting: Internal Medicine

## 2015-04-05 NOTE — Telephone Encounter (Signed)
LMOM at home number for the pt to return my call.  Her mailbox is full on cell and unable to leave a message.

## 2015-04-05 NOTE — Telephone Encounter (Signed)
PA for acyclovir ointment was denied.  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

## 2015-04-12 NOTE — Telephone Encounter (Signed)
LMOM for the pt to return my call.

## 2015-04-13 NOTE — Telephone Encounter (Signed)
Spoke to the pt.  The pharmacy informed her that the ointment was not covered.

## 2015-04-19 NOTE — Telephone Encounter (Signed)
done

## 2015-09-14 ENCOUNTER — Other Ambulatory Visit: Payer: Self-pay | Admitting: Internal Medicine

## 2015-09-18 NOTE — Telephone Encounter (Signed)
Sent to the pharmacy by e-scribe. 

## 2016-01-31 ENCOUNTER — Other Ambulatory Visit: Payer: Self-pay | Admitting: Internal Medicine

## 2016-01-31 NOTE — Telephone Encounter (Signed)
Sent to the pharmacy by e-scribe. 

## 2016-02-29 ENCOUNTER — Other Ambulatory Visit (INDEPENDENT_AMBULATORY_CARE_PROVIDER_SITE_OTHER): Payer: Medicare Other

## 2016-02-29 DIAGNOSIS — Z0000 Encounter for general adult medical examination without abnormal findings: Secondary | ICD-10-CM

## 2016-02-29 LAB — BASIC METABOLIC PANEL
BUN: 18 mg/dL (ref 6–23)
CO2: 32 meq/L (ref 19–32)
Calcium: 10.2 mg/dL (ref 8.4–10.5)
Chloride: 99 mEq/L (ref 96–112)
Creatinine, Ser: 0.87 mg/dL (ref 0.40–1.20)
GFR: 68.68 mL/min (ref >60.00)
GLUCOSE: 102 mg/dL — AB (ref 70–99)
POTASSIUM: 4.2 meq/L (ref 3.5–5.1)
SODIUM: 137 meq/L (ref 135–145)

## 2016-02-29 LAB — CBC WITH DIFFERENTIAL/PLATELET
Basophils Absolute: 0 10*3/uL (ref 0.0–0.1)
Basophils Relative: 0.5 % (ref 0.0–3.0)
EOS PCT: 3.3 % (ref 0.0–5.0)
Eosinophils Absolute: 0.2 10*3/uL (ref 0.0–0.7)
HEMATOCRIT: 38 % (ref 36.0–46.0)
Hemoglobin: 12.9 g/dL (ref 12.0–15.0)
LYMPHS ABS: 1.1 10*3/uL (ref 0.7–4.0)
LYMPHS PCT: 21.3 % (ref 12.0–46.0)
MCHC: 33.9 g/dL (ref 30.0–36.0)
MCV: 87.8 fl (ref 78.0–100.0)
MONOS PCT: 8.3 % (ref 3.0–12.0)
Monocytes Absolute: 0.4 10*3/uL (ref 0.1–1.0)
NEUTROS PCT: 66.6 % (ref 43.0–77.0)
Neutro Abs: 3.3 10*3/uL (ref 1.4–7.7)
Platelets: 251 10*3/uL (ref 150.0–400.0)
RBC: 4.32 Mil/uL (ref 3.87–5.11)
RDW: 14.5 % (ref 11.5–15.5)
WBC: 4.9 10*3/uL (ref 4.0–10.5)

## 2016-02-29 LAB — HEPATIC FUNCTION PANEL
ALBUMIN: 4.4 g/dL (ref 3.5–5.2)
ALK PHOS: 57 U/L (ref 39–117)
ALT: 20 U/L (ref 0–35)
AST: 23 U/L (ref 0–37)
Bilirubin, Direct: 0.1 mg/dL (ref 0.0–0.3)
TOTAL PROTEIN: 7.3 g/dL (ref 6.0–8.3)
Total Bilirubin: 0.5 mg/dL (ref 0.2–1.2)

## 2016-02-29 LAB — LIPID PANEL
CHOLESTEROL: 186 mg/dL (ref 0–200)
HDL: 72 mg/dL (ref >39.00)
LDL Cholesterol: 98 mg/dL (ref 0–99)
NONHDL: 113.97
Total CHOL/HDL Ratio: 3
Triglycerides: 82 mg/dL (ref 0.0–149.0)
VLDL: 16.4 mg/dL (ref 0.0–40.0)

## 2016-02-29 LAB — TSH: TSH: 2.34 u[IU]/mL (ref 0.35–4.50)

## 2016-03-07 ENCOUNTER — Encounter: Payer: Medicare PPO | Admitting: Internal Medicine

## 2016-03-16 NOTE — Patient Instructions (Addendum)
Plan gi referral consult   Try dec or off the magnesium  short term . Take 800 - 1000 iu per day of vit d . Uncertain   If [place]  Helpful  For  Surveillance  After the double mastectomy .  Can take antibiotic  And add  sfonase or nasacort every day for sinus infection.  For now stay on the lovastatin can decide if   Benefit more than risk of medications    In the next year.    Health Maintenance, Female Adopting a healthy lifestyle and getting preventive care can go a long way to promote health and wellness. Talk with your health care provider about what schedule of regular examinations is right for you. This is a good chance for you to check in with your provider about disease prevention and staying healthy. In between checkups, there are plenty of things you can do on your own. Experts have done a lot of research about which lifestyle changes and preventive measures are most likely to keep you healthy. Ask your health care provider for more information. WEIGHT AND DIET  Eat a healthy diet  Be sure to include plenty of vegetables, fruits, low-fat dairy products, and lean protein.  Do not eat a lot of foods high in solid fats, added sugars, or salt.  Get regular exercise. This is one of the most important things you can do for your health.  Most adults should exercise for at least 150 minutes each week. The exercise should increase your heart rate and make you sweat (moderate-intensity exercise).  Most adults should also do strengthening exercises at least twice a week. This is in addition to the moderate-intensity exercise.  Maintain a healthy weight  Body mass index (BMI) is a measurement that can be used to identify possible weight problems. It estimates body fat based on height and weight. Your health care provider can help determine your BMI and help you achieve or maintain a healthy weight.  For females 18 years of age and older:   A BMI below 18.5 is considered underweight.  A  BMI of 18.5 to 24.9 is normal.  A BMI of 25 to 29.9 is considered overweight.  A BMI of 30 and above is considered obese.  Watch levels of cholesterol and blood lipids  You should start having your blood tested for lipids and cholesterol at 69 years of age, then have this test every 5 years.  You may need to have your cholesterol levels checked more often if:  Your lipid or cholesterol levels are high.  You are older than 69 years of age.  You are at high risk for heart disease.  CANCER SCREENING   Lung Cancer  Lung cancer screening is recommended for adults 81-54 years old who are at high risk for lung cancer because of a history of smoking.  A yearly low-dose CT scan of the lungs is recommended for people who:  Currently smoke.  Have quit within the past 15 years.  Have at least a 30-pack-year history of smoking. A pack year is smoking an average of one pack of cigarettes a day for 1 year.  Yearly screening should continue until it has been 15 years since you quit.  Yearly screening should stop if you develop a health problem that would prevent you from having lung cancer treatment.  Breast Cancer  Practice breast self-awareness. This means understanding how your breasts normally appear and feel.  It also means doing regular breast self-exams.  Let your health care provider know about any changes, no matter how small.  If you are in your 20s or 30s, you should have a clinical breast exam (CBE) by a health care provider every 1-3 years as part of a regular health exam.  If you are 26 or older, have a CBE every year. Also consider having a breast X-ray (mammogram) every year.  If you have a family history of breast cancer, talk to your health care provider about genetic screening.  If you are at high risk for breast cancer, talk to your health care provider about having an MRI and a mammogram every year.  Breast cancer gene (BRCA) assessment is recommended for women  who have family members with BRCA-related cancers. BRCA-related cancers include:  Breast.  Ovarian.  Tubal.  Peritoneal cancers.  Results of the assessment will determine the need for genetic counseling and BRCA1 and BRCA2 testing. Cervical Cancer Your health care provider may recommend that you be screened regularly for cancer of the pelvic organs (ovaries, uterus, and vagina). This screening involves a pelvic examination, including checking for microscopic changes to the surface of your cervix (Pap test). You may be encouraged to have this screening done every 3 years, beginning at age 25.  For women ages 45-65, health care providers may recommend pelvic exams and Pap testing every 3 years, or they may recommend the Pap and pelvic exam, combined with testing for human papilloma virus (HPV), every 5 years. Some types of HPV increase your risk of cervical cancer. Testing for HPV may also be done on women of any age with unclear Pap test results.  Other health care providers may not recommend any screening for nonpregnant women who are considered low risk for pelvic cancer and who do not have symptoms. Ask your health care provider if a screening pelvic exam is right for you.  If you have had past treatment for cervical cancer or a condition that could lead to cancer, you need Pap tests and screening for cancer for at least 20 years after your treatment. If Pap tests have been discontinued, your risk factors (such as having a new sexual partner) need to be reassessed to determine if screening should resume. Some women have medical problems that increase the chance of getting cervical cancer. In these cases, your health care provider may recommend more frequent screening and Pap tests. Colorectal Cancer  This type of cancer can be detected and often prevented.  Routine colorectal cancer screening usually begins at 69 years of age and continues through 69 years of age.  Your health care  provider may recommend screening at an earlier age if you have risk factors for colon cancer.  Your health care provider may also recommend using home test kits to check for hidden blood in the stool.  A small camera at the end of a tube can be used to examine your colon directly (sigmoidoscopy or colonoscopy). This is done to check for the earliest forms of colorectal cancer.  Routine screening usually begins at age 48.  Direct examination of the colon should be repeated every 5-10 years through 69 years of age. However, you may need to be screened more often if early forms of precancerous polyps or small growths are found. Skin Cancer  Check your skin from head to toe regularly.  Tell your health care provider about any new moles or changes in moles, especially if there is a change in a mole's shape or color.  Also tell  your health care provider if you have a mole that is larger than the size of a pencil eraser.  Always use sunscreen. Apply sunscreen liberally and repeatedly throughout the day.  Protect yourself by wearing long sleeves, pants, a wide-brimmed hat, and sunglasses whenever you are outside. HEART DISEASE, DIABETES, AND HIGH BLOOD PRESSURE   High blood pressure causes heart disease and increases the risk of stroke. High blood pressure is more likely to develop in:  People who have blood pressure in the high end of the normal range (130-139/85-89 mm Hg).  People who are overweight or obese.  People who are African American.  If you are 55-38 years of age, have your blood pressure checked every 3-5 years. If you are 73 years of age or older, have your blood pressure checked every year. You should have your blood pressure measured twice--once when you are at a hospital or clinic, and once when you are not at a hospital or clinic. Record the average of the two measurements. To check your blood pressure when you are not at a hospital or clinic, you can use:  An automated  blood pressure machine at a pharmacy.  A home blood pressure monitor.  If you are between 41 years and 1 years old, ask your health care provider if you should take aspirin to prevent strokes.  Have regular diabetes screenings. This involves taking a blood sample to check your fasting blood sugar level.  If you are at a normal weight and have a low risk for diabetes, have this test once every three years after 69 years of age.  If you are overweight and have a high risk for diabetes, consider being tested at a younger age or more often. PREVENTING INFECTION  Hepatitis B  If you have a higher risk for hepatitis B, you should be screened for this virus. You are considered at high risk for hepatitis B if:  You were born in a country where hepatitis B is common. Ask your health care provider which countries are considered high risk.  Your parents were born in a high-risk country, and you have not been immunized against hepatitis B (hepatitis B vaccine).  You have HIV or AIDS.  You use needles to inject street drugs.  You live with someone who has hepatitis B.  You have had sex with someone who has hepatitis B.  You get hemodialysis treatment.  You take certain medicines for conditions, including cancer, organ transplantation, and autoimmune conditions. Hepatitis C  Blood testing is recommended for:  Everyone born from [birth year] through 1964.  Anyone with known risk factors for hepatitis C. Sexually transmitted infections (STIs)  You should be screened for sexually transmitted infections (STIs) including gonorrhea and chlamydia if:  You are sexually active and are younger than 69 years of age.  You are older than 69 years of age and your health care provider tells you that you are at risk for this type of infection.  Your sexual activity has changed since you were last screened and you are at an increased risk for chlamydia or gonorrhea. Ask your health care provider if you are  at risk.  If you do not have HIV, but are at risk, it may be recommended that you take a prescription medicine daily to prevent HIV infection. This is called pre-exposure prophylaxis (PrEP). You are considered at risk if:  You are sexually active and do not regularly use condoms or know the HIV status of your partner(s).  You take drugs by injection.  You are sexually active with a partner who has HIV. Talk with your health care provider about whether you are at high risk of being infected with HIV. If you choose to begin PrEP, you should first be tested for HIV. You should then be tested every 3 months for as long as you are taking PrEP.  PREGNANCY   If you are premenopausal and you may become pregnant, ask your health care provider about preconception counseling.  If you may become pregnant, take 400 to 800 micrograms (mcg) of folic acid every day.  If you want to prevent pregnancy, talk to your health care provider about birth control (contraception). OSTEOPOROSIS AND MENOPAUSE   Osteoporosis is a disease in which the bones lose minerals and strength with aging. This can result in serious bone fractures. Your risk for osteoporosis can be identified using a bone density scan.  If you are 85 years of age or older, or if you are at risk for osteoporosis and fractures, ask your health care provider if you should be screened.  Ask your health care provider whether you should take a calcium or vitamin D supplement to lower your risk for osteoporosis.  Menopause may have certain physical symptoms and risks.  Hormone replacement therapy may reduce some of these symptoms and risks. Talk to your health care provider about whether hormone replacement therapy is right for you.  HOME CARE INSTRUCTIONS   Schedule regular health, dental, and eye exams.  Stay current with your immunizations.   Do not use any tobacco products including cigarettes, chewing tobacco, or electronic  cigarettes.  If you are pregnant, do not drink alcohol.  If you are breastfeeding, limit how much and how often you drink alcohol.  Limit alcohol intake to no more than 1 drink per day for nonpregnant women. One drink equals 12 ounces of beer, 5 ounces of wine, or 1 ounces of hard liquor.  Do not use street drugs.  Do not share needles.  Ask your health care provider for help if you need support or information about quitting drugs.  Tell your health care provider if you often feel depressed.  Tell your health care provider if you have ever been abused or do not feel safe at home.   This information is not intended to replace advice given to you by your health care provider. Make sure you discuss any questions you have with your health care provider.   Document Released: 05/26/2011 Document Revised: 12/01/2014 Document Reviewed: 10/12/2013 Elsevier Interactive Patient Education 2016 [employer] Sinusitis, Adult Sinusitis is redness, soreness, and inflammation of the paranasal sinuses. Paranasal sinuses are air pockets within the bones of your face. They are located beneath your eyes, in the middle of your forehead, and above your eyes. In healthy paranasal sinuses, mucus is able to drain out, and air is able to circulate through them by way of your nose. However, when your paranasal sinuses are inflamed, mucus and air can become trapped. This can allow bacteria and other germs to grow and cause infection. Sinusitis can develop quickly and last only a short time (acute) or continue over a long period (chronic). Sinusitis that lasts for more than 12 weeks is considered chronic. CAUSES Causes of sinusitis include:  Allergies.  Structural abnormalities, such as displacement of the cartilage that separates your nostrils (deviated septum), which can decrease the air flow through your nose and sinuses and affect sinus drainage.  Functional abnormalities, such as when  the small hairs  (cilia) that line your sinuses and help remove mucus do not work properly or are not present. SIGNS AND SYMPTOMS Symptoms of acute and chronic sinusitis are the same. The primary symptoms are pain and pressure around the affected sinuses. Other symptoms include:  Upper toothache.  Earache.  Headache.  Bad breath.  Decreased sense of smell and taste.  A cough, which worsens when you are lying flat.  Fatigue.  Fever.  Thick drainage from your nose, which often is green and may contain pus (purulent).  Swelling and warmth over the affected sinuses. DIAGNOSIS Your health care provider will perform a physical exam. During your exam, your health care provider may perform any of the following to help determine if you have acute sinusitis or chronic sinusitis:  Look in your nose for signs of abnormal growths in your nostrils (nasal polyps).  Tap over the affected sinus to check for signs of infection.  View the inside of your sinuses using an imaging device that has a light attached (endoscope). If your health care provider suspects that you have chronic sinusitis, one or more of the following tests may be recommended:  Allergy tests.  Nasal culture. A sample of mucus is taken from your nose, sent to a lab, and screened for bacteria.  Nasal cytology. A sample of mucus is taken from your nose and examined by your health care provider to determine if your sinusitis is related to an allergy. TREATMENT Most cases of acute sinusitis are related to a viral infection and will resolve on their own within 10 days. Sometimes, medicines are prescribed to help relieve symptoms of both acute and chronic sinusitis. These may include pain medicines, decongestants, nasal steroid sprays, or saline sprays. However, for sinusitis related to a bacterial infection, your health care provider will prescribe antibiotic medicines. These are medicines that will help kill the bacteria causing the  infection. Rarely, sinusitis is caused by a fungal infection. In these cases, your health care provider will prescribe antifungal medicine. For some cases of chronic sinusitis, surgery is needed. Generally, these are cases in which sinusitis recurs more than 3 times per year, despite other treatments. HOME CARE INSTRUCTIONS  Drink plenty of water. Water helps thin the mucus so your sinuses can drain more easily.  Use a humidifier.  Inhale steam 3-4 times a day (for example, sit in the bathroom with the shower running).  Apply a warm, moist washcloth to your face 3-4 times a day, or as directed by your health care provider.  Use saline nasal sprays to help moisten and clean your sinuses.  Take medicines only as directed by your health care provider.  If you were prescribed either an antibiotic or antifungal medicine, finish it all even if you start to feel better. SEEK IMMEDIATE MEDICAL CARE IF:  You have increasing pain or severe headaches.  You have nausea, vomiting, or drowsiness.  You have swelling around your face.  You have vision problems.  You have a stiff neck.  You have difficulty breathing.   This information is not intended to replace advice given to you by your health care provider. Make sure you discuss any questions you have with your health care provider.   Document Released: 11/10/2005 Document Revised: 12/01/2014 Document Reviewed: 11/25/2011 [employer] Interactive Patient Education [employer].

## 2016-03-16 NOTE — Progress Notes (Signed)
Pre visit review using our clinic review tool, if applicable. No additional management support is needed unless otherwise documented below in the visit note.  Chief Complaint  Patient presents with  . Medicare Wellness    probelsm with bowel loose urgency, also sinus infection green nasal dr for 3 weeks     HPI: Nicole [name] 70 y.o. comes in today for Preventive Medicare wellness visit . and exam t.   "I have a sinus infection  3 rd week and green  Last week    Face pain all over .    Face full of green  Slime .   " some cough   New problem bowel habits last spring  Had urgency and coulnt control  And couldn't get back to house.  and since then  Liquidly runny  And   Not reg any more  No new meds or vits.    Last colon check  ? 2012    Tried bran and yogurt and chia seeds.  Rough age makes worse.  Last nl bowel habits  About a year ago  No bleed or weight loss.   Has osteopenia 2015 - 1.6 -1.3  Has brca 1 asks if should have [place] sis had implants and did [place] for implant and noted tumor .  Has had oophorectomy but has uterus   Had sumba injury  Torn cartilage   Recovering   Lipids nos e of med noted    Health Maintenance  Topic Date Due  . ZOSTAVAX  07/01/2007  . Hepatitis C Screening  03/17/2017 (Originally Mar 28, 1946)  . INFLUENZA VACCINE  06/24/2016  . COLONOSCOPY  06/18/2021  . TETANUS/TDAP  04/14/2024  . DEXA SCAN  Completed  . PNA vac Low Risk Adult  Completed   Health Maintenance Review LIFESTYLE:  TAD no tob   eoth ocass  Sugar beverages:   Not regular  Sleep: 6-7    MEDICARE DOCUMENT QUESTIONS  TO SCAN  Mess up knee and it band and gained from dec exercise    Hearing: ok   Vision:  No limitations at present . Last eye check UTD  Safety:  Has smoke detector and wears seat belts.  No firearms. No excess sun exposure. Sees dentist regularly.  Falls:  No but dance  Injury as above   Advance directive :  Reviewed  Has one.  Memory: Felt to be good  , no  concern from her or her family.  Depression: No anhedonia unusual crying or depressive symptoms  Nutrition: Eats well balanced diet; adequate calcium and vitamin D. No swallowing chewing problems.  Injury: no major injuries in the last six months.  Other healthcare providers:  Reviewed today .  Social:  Lives with spouse married. hh of 2  No pets.   Preventive parameters: up-to-date  Reviewed   ADLS:   There are no problems or need for assistance  driving, feeding, obtaining food, dressing, toileting and bathing, managing money using phone. She is independent.    ROS:  GEN/ HEENT: No fever, significant weight changes sweats headaches vision problems hearing changes, CV/ PULM; No chest pain shortness of breath cough, syncope,edema  change in exercise tolerance. GI /GU: No adominal pain, vomiting, see above bowel changes. No blood in the stool. No significant GU symptoms. SKIN/HEME: ,no acute skin rashes suspicious lesions or bleeding. No lymphadenopathy, nodules, masses.  NEURO/ PSYCH:  No neurologic signs such as weakness numbness. No depression anxiety. IMM/ Allergy: No unusual infections.  Allergy .  REST of 12 system review negative except as per HPI   Past Medical History  Diagnosis Date  . Hx of breast cancer   . Headache(784.0)   . Rheumatoid factor positive     OA   had rheum consult  . GERD (gastroesophageal reflux disease)   . Hypercholesteremia   . Breast cancer ([place])     Left- Ductal ca SR-, Chemo ([place]) rad rx   . BRCA1 positive     Family History  Problem Relation Age of Onset  . Arthritis      maternal side RA cousin  . Kidney disease      maternal side  . Diabetes      1st degree relative, paternal side  . Diabetes Mother   . Hypertension Mother   . Heart disease Mother   . Diabetes Father   . Lung cancer Father     Social History   Social History  . Marital Status: Married    Spouse Name: N/A  . Number of Children: N/A  . Years of  Education: N/A   Social History Main Topics  . Smoking status: Former Research scientist (life sciences)  . Smokeless tobacco: Never Used  . Alcohol Use: 0.0 oz/week    0 Standard drinks or equivalent per week     Comment: occ  . Drug Use: No  . Sexual Activity: Not Asked   Other Topics Concern  . None   Social History Narrative   Married, household of 2. 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 Designated Party release signed 04/17/10.      Husband with final stage prostate cancer at this time.   hh of 2     Outpatient Encounter Prescriptions as of 03/17/2016  Medication Sig  . acyclovir (ZOVIRAX) 200 MG capsule TAKE 2 CAPSULES BY MOUTH TWICE A DAY AS NEEDED FOROUTBREAK  . acyclovir ointment (ZOVIRAX) 5 % Apply 1 application topically 3 (three) times daily as needed.  Marland Kitchen albuterol (PROVENTIL HFA;VENTOLIN HFA) 108 (90 BASE) MCG/ACT inhaler Inhale 2 puffs into the lungs every 4 (four) hours as needed for wheezing or shortness of breath.  . calcium carbonate (OS-CAL) 600 MG TABS Take 600 mg by mouth 3 (three) times daily with meals.    . fish oil-omega-3 fatty acids 1000 MG capsule Take 1 g by mouth daily.   Marland Kitchen lovastatin (MEVACOR) 20 MG tablet Take 1 tablet (20 mg total) by mouth daily.  . Magnesium 300 MG CAPS Take 1 capsule by mouth daily.   . ranitidine (ZANTAC) 150 MG tablet TAKE 1 TABLET BY MOUTH TWO TIMES A DAY  . Vitamin D, Cholecalciferol, 1000 UNITS CAPS Take by mouth.  . [DISCONTINUED] aspirin 325 MG EC tablet Take 325 mg by mouth at bedtime as needed for pain.  Marland Kitchen amoxicillin (AMOXIL) 875 MG tablet Take 1 tablet (875 mg total) by mouth 3 (three) times daily.  . [DISCONTINUED] HYDROcodone-homatropine (HYDROMET) 5-1.5 MG/5ML syrup Take 5 mLs by mouth every 4 (four) hours as needed.   No facility-administered encounter medications on file as of 03/17/2016.    EXAM:  BP 118/78 mmHg  Temp(Src) 98.5 F (36.9 C) (Oral)  Ht '5\' 1"'$  (1.549 m)  Wt 137 lb 14.4 oz (62.551 kg)  BMI 26.07  kg/m2  Body mass index is 26.07 kg/(m^2).  Physical Exam: Vital signs reviewed ELF:YBOF is a well-developed well-nourished alert cooperative   who appears stated age in no acute distress.  HEENT: normocephalic atraumatic , Eyes: PERRL EOM's full, conjunctiva  clear, Nares: paten,t no deformity discharge or tenderness., ver congestion   Nares dc  Ears: no deformity EAC's clear TMs with normal landmarks. Mouth: clear OP, no lesions, edema.  Moist mucous membranes. Dentition in adequate repair. NECK: supple without masses, thyromegaly or bruits. CHEST/PULM:  Clear to auscultation and percussion breath sounds equal no wheeze , rales or rhonchi. No chest wall deformities or tenderness. CV: PMI is nondisplaced, S1 S2 no gallops, murmurs, rubs. Peripheral pulses are full without delay.No JVD .  Breast absent  No masses axilla clear  ABDOMEN: Bowel sounds normal nontender  No guard or rebound, no hepato splenomegal no CVA tenderness.   Extremtities:  No clubbing cyanosis or edema, no acute joint swelling or redness no focal atrophy NEURO:  Oriented x3, cranial nerves 3-12 appear to be intact, no obvious focal weakness,gait within normal limits no abnormal reflexes or asymmetrical SKIN: No acute rashes normal turgor, color, no bruising or petechiae. PSYCH: Oriented, good eye contact, no obvious depression anxiety, cognition and judgment appear normal. LN: no cervical axillary inguinal adenopathy No noted deficits in memory, attention, and speech.   Lab Results  Component Value Date   WBC 4.9 02/29/2016   HGB 12.9 02/29/2016   HCT 38.0 02/29/2016   PLT 251.0 02/29/2016   GLUCOSE 102* 02/29/2016   CHOL 186 02/29/2016   TRIG 82.0 02/29/2016   HDL 72.00 02/29/2016   LDLDIRECT 119.9 12/30/2012   LDLCALC 98 02/29/2016   ALT 20 02/29/2016   AST 23 02/29/2016   NA 137 02/29/2016   K 4.2 02/29/2016   CL 99 02/29/2016   CREATININE 0.87 02/29/2016   BUN 18 02/29/2016   CO2 32 02/29/2016   TSH 2.34  02/29/2016    ASSESSMENT AND PLAN:  Discussed the following assessment and plan:  Visit for preventive health examination  Medicare annual wellness visit, subsequent  BRCA1 positive  Mixed hyperlipidemia  Medication management  Change in bowel habits calibuer and loose - dec or off mag will refer to  her Gi for reevaluation of  sx Dr Cristina Gong  Osteopenia - -1.7 - 1.3 2015  Protracted URI  Acute sinusitis with symptoms greater than 10 days  Patient Care Team: Burnis Medin, MD as PCP - General Bo Merino, MD (Rheumatology) Ronald Lobo, MD (Gastroenterology) Phylliss Bob, MD as Consulting Physician (Orthopedic Surgery) Milly Jakob, MD as Consulting Physician (Orthopedic Surgery) Marygrace Drought, MD as Consulting Physician (Ophthalmology)  Patient Instructions   Plan gi referral consult   Try dec or off the magnesium  short term . Take 800 - 1000 iu per day of vit d . Uncertain   If [place]  Helpful  For  Surveillance  After the double mastectomy .  Can take antibiotic  And add  sfonase or nasacort every day for sinus infection.  For now stay on the lovastatin can decide if   Benefit more than risk of medications    In the next year.    Health Maintenance, Female Adopting a healthy lifestyle and getting preventive care can go a long way to promote health and wellness. Talk with your health care provider about what schedule of regular examinations is right for you. This is a good chance for you to check in with your provider about disease prevention and staying healthy. In between checkups, there are plenty of things you can do on your own. Experts have done a lot of research about which lifestyle changes and preventive measures are most likely to keep you healthy.  Ask your health care provider for more information. WEIGHT AND DIET  Eat a healthy diet  Be sure to include plenty of vegetables, fruits, low-fat dairy products, and lean protein.  Do not eat a lot of  foods high in solid fats, added sugars, or salt.  Get regular exercise. This is one of the most important things you can do for your health.  Most adults should exercise for at least 150 minutes each week. The exercise should increase your heart rate and make you sweat (moderate-intensity exercise).  Most adults should also do strengthening exercises at least twice a week. This is in addition to the moderate-intensity exercise.  Maintain a healthy weight  Body mass index (BMI) is a measurement that can be used to identify possible weight problems. It estimates body fat based on height and weight. Your health care provider can help determine your BMI and help you achieve or maintain a healthy weight.  For females 69 years of age and older:   A BMI below 18.5 is considered underweight.  A BMI of 18.5 to 24.9 is normal.  A BMI of 25 to 29.9 is considered overweight.  A BMI of 30 and above is considered obese.  Watch levels of cholesterol and blood lipids  You should start having your blood tested for lipids and cholesterol at 69 years of age, then have this test every 5 years.  You may need to have your cholesterol levels checked more often if:  Your lipid or cholesterol levels are high.  You are older than 69 years of age.  You are at high risk for heart disease.  CANCER SCREENING   Lung Cancer  Lung cancer screening is recommended for adults 26-21 years old who are at high risk for lung cancer because of a history of smoking.  A yearly low-dose CT scan of the lungs is recommended for people who:  Currently smoke.  Have quit within the past 15 years.  Have at least a 30-pack-year history of smoking. A pack year is smoking an average of one pack of cigarettes a day for 1 year.  Yearly screening should continue until it has been 15 years since you quit.  Yearly screening should stop if you develop a health problem that would prevent you from having lung cancer  treatment.  Breast Cancer  Practice breast self-awareness. This means understanding how your breasts normally appear and feel.  It also means doing regular breast self-exams. Let your health care provider know about any changes, no matter how small.  If you are in your 20s or 30s, you should have a clinical breast exam (CBE) by a health care provider every 1-3 years as part of a regular health exam.  If you are 84 or older, have a CBE every year. Also consider having a breast X-ray (mammogram) every year.  If you have a family history of breast cancer, talk to your health care provider about genetic screening.  If you are at high risk for breast cancer, talk to your health care provider about having an MRI and a mammogram every year.  Breast cancer gene (BRCA) assessment is recommended for women who have family members with BRCA-related cancers. BRCA-related cancers include:  Breast.  Ovarian.  Tubal.  Peritoneal cancers.  Results of the assessment will determine the need for genetic counseling and BRCA1 and BRCA2 testing. Cervical Cancer Your health care provider may recommend that you be screened regularly for cancer of the pelvic organs (ovaries, uterus, and  vagina). This screening involves a pelvic examination, including checking for microscopic changes to the surface of your cervix (Pap test). You may be encouraged to have this screening done every 3 years, beginning at age 63.  For women ages 71-65, health care providers may recommend pelvic exams and Pap testing every 3 years, or they may recommend the Pap and pelvic exam, combined with testing for human papilloma virus (HPV), every 5 years. Some types of HPV increase your risk of cervical cancer. Testing for HPV may also be done on women of any age with unclear Pap test results.  Other health care providers may not recommend any screening for nonpregnant women who are considered low risk for pelvic cancer and who do not have  symptoms. Ask your health care provider if a screening pelvic exam is right for you.  If you have had past treatment for cervical cancer or a condition that could lead to cancer, you need Pap tests and screening for cancer for at least 20 years after your treatment. If Pap tests have been discontinued, your risk factors (such as having a new sexual partner) need to be reassessed to determine if screening should resume. Some women have medical problems that increase the chance of getting cervical cancer. In these cases, your health care provider may recommend more frequent screening and Pap tests. Colorectal Cancer  This type of cancer can be detected and often prevented.  Routine colorectal cancer screening usually begins at 69 years of age and continues through 69 years of age.  Your health care provider may recommend screening at an earlier age if you have risk factors for colon cancer.  Your health care provider may also recommend using home test kits to check for hidden blood in the stool.  A small camera at the end of a tube can be used to examine your colon directly (sigmoidoscopy or colonoscopy). This is done to check for the earliest forms of colorectal cancer.  Routine screening usually begins at age 81.  Direct examination of the colon should be repeated every 5-10 years through 69 years of age. However, you may need to be screened more often if early forms of precancerous polyps or small growths are found. Skin Cancer  Check your skin from head to toe regularly.  Tell your health care provider about any new moles or changes in moles, especially if there is a change in a mole's shape or color.  Also tell your health care provider if you have a mole that is larger than the size of a pencil eraser.  Always use sunscreen. Apply sunscreen liberally and repeatedly throughout the day.  Protect yourself by wearing long sleeves, pants, a wide-brimmed hat, and sunglasses whenever you are  outside. HEART DISEASE, DIABETES, AND HIGH BLOOD PRESSURE   High blood pressure causes heart disease and increases the risk of stroke. High blood pressure is more likely to develop in:  People who have blood pressure in the high end of the normal range (130-139/85-89 mm Hg).  People who are overweight or obese.  People who are African American.  If you are 54-56 years of age, have your blood pressure checked every 3-5 years. If you are 47 years of age or older, have your blood pressure checked every year. You should have your blood pressure measured twice--once when you are at a hospital or clinic, and once when you are not at a hospital or clinic. Record the average of the two measurements. To check your blood  pressure when you are not at a hospital or clinic, you can use:  An automated blood pressure machine at a pharmacy.  A home blood pressure monitor.  If you are between 94 years and 30 years old, ask your health care provider if you should take aspirin to prevent strokes.  Have regular diabetes screenings. This involves taking a blood sample to check your fasting blood sugar level.  If you are at a normal weight and have a low risk for diabetes, have this test once every three years after 69 years of age.  If you are overweight and have a high risk for diabetes, consider being tested at a younger age or more often. PREVENTING INFECTION  Hepatitis B  If you have a higher risk for hepatitis B, you should be screened for this virus. You are considered at high risk for hepatitis B if:  You were born in a country where hepatitis B is common. Ask your health care provider which countries are considered high risk.  Your parents were born in a high-risk country, and you have not been immunized against hepatitis B (hepatitis B vaccine).  You have HIV or AIDS.  You use needles to inject street drugs.  You live with someone who has hepatitis B.  You have had sex with someone who has  hepatitis B.  You get hemodialysis treatment.  You take certain medicines for conditions, including cancer, organ transplantation, and autoimmune conditions. Hepatitis C  Blood testing is recommended for:  Everyone born from [birth year] through 1964.  Anyone with known risk factors for hepatitis C. Sexually transmitted infections (STIs)  You should be screened for sexually transmitted infections (STIs) including gonorrhea and chlamydia if:  You are sexually active and are younger than 69 years of age.  You are older than 69 years of age and your health care provider tells you that you are at risk for this type of infection.  Your sexual activity has changed since you were last screened and you are at an increased risk for chlamydia or gonorrhea. Ask your health care provider if you are at risk.  If you do not have HIV, but are at risk, it may be recommended that you take a prescription medicine daily to prevent HIV infection. This is called pre-exposure prophylaxis (PrEP). You are considered at risk if:  You are sexually active and do not regularly use condoms or know the HIV status of your partner(s).  You take drugs by injection.  You are sexually active with a partner who has HIV. Talk with your health care provider about whether you are at high risk of being infected with HIV. If you choose to begin PrEP, you should first be tested for HIV. You should then be tested every 3 months for as long as you are taking PrEP.  PREGNANCY   If you are premenopausal and you may become pregnant, ask your health care provider about preconception counseling.  If you may become pregnant, take 400 to 800 micrograms (mcg) of folic acid every day.  If you want to prevent pregnancy, talk to your health care provider about birth control (contraception). OSTEOPOROSIS AND MENOPAUSE   Osteoporosis is a disease in which the bones lose minerals and strength with aging. This can result in serious bone  fractures. Your risk for osteoporosis can be identified using a bone density scan.  If you are 47 years of age or older, or if you are at risk for osteoporosis and fractures, ask your  health care provider if you should be screened.  Ask your health care provider whether you should take a calcium or vitamin D supplement to lower your risk for osteoporosis.  Menopause may have certain physical symptoms and risks.  Hormone replacement therapy may reduce some of these symptoms and risks. Talk to your health care provider about whether hormone replacement therapy is right for you.  HOME CARE INSTRUCTIONS   Schedule regular health, dental, and eye exams.  Stay current with your immunizations.   Do not use any tobacco products including cigarettes, chewing tobacco, or electronic cigarettes.  If you are pregnant, do not drink alcohol.  If you are breastfeeding, limit how much and how often you drink alcohol.  Limit alcohol intake to no more than 1 drink per day for nonpregnant women. One drink equals 12 ounces of beer, 5 ounces of wine, or 1 ounces of hard liquor.  Do not use street drugs.  Do not share needles.  Ask your health care provider for help if you need support or information about quitting drugs.  Tell your health care provider if you often feel depressed.  Tell your health care provider if you have ever been abused or do not feel safe at home.   This information is not intended to replace advice given to you by your health care provider. Make sure you discuss any questions you have with your health care provider.   Document Released: 05/26/2011 Document Revised: 12/01/2014 Document Reviewed: 10/12/2013 Elsevier Interactive Patient Education 2016 [employer] Sinusitis, Adult Sinusitis is redness, soreness, and inflammation of the paranasal sinuses. Paranasal sinuses are air pockets within the bones of your face. They are located beneath your eyes, in the middle of your  forehead, and above your eyes. In healthy paranasal sinuses, mucus is able to drain out, and air is able to circulate through them by way of your nose. However, when your paranasal sinuses are inflamed, mucus and air can become trapped. This can allow bacteria and other germs to grow and cause infection. Sinusitis can develop quickly and last only a short time (acute) or continue over a long period (chronic). Sinusitis that lasts for more than 12 weeks is considered chronic. CAUSES Causes of sinusitis include:  Allergies.  Structural abnormalities, such as displacement of the cartilage that separates your nostrils (deviated septum), which can decrease the air flow through your nose and sinuses and affect sinus drainage.  Functional abnormalities, such as when the small hairs (cilia) that line your sinuses and help remove mucus do not work properly or are not present. SIGNS AND SYMPTOMS Symptoms of acute and chronic sinusitis are the same. The primary symptoms are pain and pressure around the affected sinuses. Other symptoms include:  Upper toothache.  Earache.  Headache.  Bad breath.  Decreased sense of smell and taste.  A cough, which worsens when you are lying flat.  Fatigue.  Fever.  Thick drainage from your nose, which often is green and may contain pus (purulent).  Swelling and warmth over the affected sinuses. DIAGNOSIS Your health care provider will perform a physical exam. During your exam, your health care provider may perform any of the following to help determine if you have acute sinusitis or chronic sinusitis:  Look in your nose for signs of abnormal growths in your nostrils (nasal polyps).  Tap over the affected sinus to check for signs of infection.  View the inside of your sinuses using an imaging device that has a light attached (endoscope).  If your health care provider suspects that you have chronic sinusitis, one or more of the following tests may be  recommended:  Allergy tests.  Nasal culture. A sample of mucus is taken from your nose, sent to a lab, and screened for bacteria.  Nasal cytology. A sample of mucus is taken from your nose and examined by your health care provider to determine if your sinusitis is related to an allergy. TREATMENT Most cases of acute sinusitis are related to a viral infection and will resolve on their own within 10 days. Sometimes, medicines are prescribed to help relieve symptoms of both acute and chronic sinusitis. These may include pain medicines, decongestants, nasal steroid sprays, or saline sprays. However, for sinusitis related to a bacterial infection, your health care provider will prescribe antibiotic medicines. These are medicines that will help kill the bacteria causing the infection. Rarely, sinusitis is caused by a fungal infection. In these cases, your health care provider will prescribe antifungal medicine. For some cases of chronic sinusitis, surgery is needed. Generally, these are cases in which sinusitis recurs more than 3 times per year, despite other treatments. HOME CARE INSTRUCTIONS  Drink plenty of water. Water helps thin the mucus so your sinuses can drain more easily.  Use a humidifier.  Inhale steam 3-4 times a day (for example, sit in the bathroom with the shower running).  Apply a warm, moist washcloth to your face 3-4 times a day, or as directed by your health care provider.  Use saline nasal sprays to help moisten and clean your sinuses.  Take medicines only as directed by your health care provider.  If you were prescribed either an antibiotic or antifungal medicine, finish it all even if you start to feel better. SEEK IMMEDIATE MEDICAL CARE IF:  You have increasing pain or severe headaches.  You have nausea, vomiting, or drowsiness.  You have swelling around your face.  You have vision problems.  You have a stiff neck.  You have difficulty breathing.   This  information is not intended to replace advice given to you by your health care provider. Make sure you discuss any questions you have with your health care provider.   Document Released: 11/10/2005 Document Revised: 12/01/2014 Document Reviewed: 11/25/2011 Elsevier Interactive Patient Education 2016 Marion K. [name] M.D.

## 2016-03-17 ENCOUNTER — Ambulatory Visit (INDEPENDENT_AMBULATORY_CARE_PROVIDER_SITE_OTHER): Payer: Medicare Other | Admitting: Internal Medicine

## 2016-03-17 ENCOUNTER — Encounter: Payer: Self-pay | Admitting: Internal Medicine

## 2016-03-17 VITALS — BP 118/78 | Temp 98.5°F | Ht 61.0 in | Wt 137.9 lb

## 2016-03-17 DIAGNOSIS — Z1501 Genetic susceptibility to malignant neoplasm of breast: Secondary | ICD-10-CM | POA: Diagnosis not present

## 2016-03-17 DIAGNOSIS — E782 Mixed hyperlipidemia: Secondary | ICD-10-CM | POA: Diagnosis not present

## 2016-03-17 DIAGNOSIS — J069 Acute upper respiratory infection, unspecified: Secondary | ICD-10-CM

## 2016-03-17 DIAGNOSIS — J019 Acute sinusitis, unspecified: Secondary | ICD-10-CM

## 2016-03-17 DIAGNOSIS — Z79899 Other long term (current) drug therapy: Secondary | ICD-10-CM | POA: Diagnosis not present

## 2016-03-17 DIAGNOSIS — Z0000 Encounter for general adult medical examination without abnormal findings: Secondary | ICD-10-CM

## 2016-03-17 DIAGNOSIS — M8580 Other specified disorders of bone density and structure, unspecified site: Secondary | ICD-10-CM

## 2016-03-17 DIAGNOSIS — R194 Change in bowel habit: Secondary | ICD-10-CM | POA: Diagnosis not present

## 2016-03-17 DIAGNOSIS — R152 Fecal urgency: Secondary | ICD-10-CM

## 2016-03-17 DIAGNOSIS — Z1509 Genetic susceptibility to other malignant neoplasm: Secondary | ICD-10-CM

## 2016-03-17 MED ORDER — AMOXICILLIN 875 MG PO TABS: 875.0000 mg | ORAL_TABLET | Freq: Three times a day (TID) | ORAL | Status: DC

## 2016-03-17 NOTE — Assessment & Plan Note (Signed)
More agg lsi  Consideration of trying off statin at next check

## 2016-04-10 ENCOUNTER — Telehealth: Payer: Self-pay | Admitting: Internal Medicine

## 2016-04-10 NOTE — Telephone Encounter (Signed)
Pt needs refill on acyclovir ointment call into [facility]

## 2016-04-10 NOTE — Telephone Encounter (Signed)
Sent to the pharmacy by e-scribe. 

## 2016-04-10 NOTE — Telephone Encounter (Signed)
Sent to the pharmacy

## 2016-08-06 ENCOUNTER — Other Ambulatory Visit: Payer: Self-pay | Admitting: Internal Medicine

## 2016-08-08 NOTE — Telephone Encounter (Signed)
Sent to the pharmacy by e-scribe. 

## 2016-09-25 ENCOUNTER — Telehealth: Payer: Self-pay | Admitting: Internal Medicine

## 2016-09-25 ENCOUNTER — Other Ambulatory Visit: Payer: Self-pay | Admitting: Family Medicine

## 2016-09-25 DIAGNOSIS — M8580 Other specified disorders of bone density and structure, unspecified site: Secondary | ICD-10-CM

## 2016-09-25 NOTE — Telephone Encounter (Signed)
° ° °  Pt call to schedule her physical for 2018 and is asking about her bone density test. She contacted the breast center and they told her they needed an order to schedule this test

## 2016-09-25 NOTE — Telephone Encounter (Signed)
Nicole [name] informed pt that the order is in [place].

## 2016-09-25 NOTE — Telephone Encounter (Signed)
Left a message for a return call.

## 2016-10-14 ENCOUNTER — Ambulatory Visit
Admission: RE | Admit: 2016-10-14 | Discharge: 2016-10-14 | Disposition: A | Discharge status: Home / Self Care | Payer: Medicare Other | Source: Ambulatory Visit | Attending: Internal Medicine | Admitting: Internal Medicine

## 2016-10-14 DIAGNOSIS — M8580 Other specified disorders of bone density and structure, unspecified site: Secondary | ICD-10-CM

## 2016-11-26 ENCOUNTER — Other Ambulatory Visit: Payer: Self-pay | Admitting: Internal Medicine

## 2016-11-27 NOTE — Telephone Encounter (Signed)
Sent to the pharmacy by e-scribe.  Has CPX scheduled for 04/21/17.

## 2017-02-17 ENCOUNTER — Other Ambulatory Visit: Payer: Self-pay | Admitting: Internal Medicine

## 2017-03-09 ENCOUNTER — Other Ambulatory Visit: Payer: Self-pay | Admitting: Internal Medicine

## 2017-03-09 ENCOUNTER — Telehealth: Payer: Self-pay | Admitting: Internal Medicine

## 2017-03-10 ENCOUNTER — Other Ambulatory Visit: Payer: Self-pay | Admitting: Family Medicine

## 2017-03-10 DIAGNOSIS — R002 Palpitations: Secondary | ICD-10-CM

## 2017-03-10 DIAGNOSIS — E559 Vitamin D deficiency, unspecified: Secondary | ICD-10-CM

## 2017-03-10 DIAGNOSIS — R030 Elevated blood-pressure reading, without diagnosis of hypertension: Secondary | ICD-10-CM

## 2017-03-10 DIAGNOSIS — R739 Hyperglycemia, unspecified: Secondary | ICD-10-CM

## 2017-03-10 DIAGNOSIS — E782 Mixed hyperlipidemia: Secondary | ICD-10-CM

## 2017-03-11 ENCOUNTER — Other Ambulatory Visit (INDEPENDENT_AMBULATORY_CARE_PROVIDER_SITE_OTHER): Payer: Medicare Other

## 2017-03-11 DIAGNOSIS — R030 Elevated blood-pressure reading, without diagnosis of hypertension: Secondary | ICD-10-CM | POA: Diagnosis not present

## 2017-03-11 DIAGNOSIS — R002 Palpitations: Secondary | ICD-10-CM | POA: Diagnosis not present

## 2017-03-11 DIAGNOSIS — R739 Hyperglycemia, unspecified: Secondary | ICD-10-CM | POA: Diagnosis not present

## 2017-03-11 DIAGNOSIS — E559 Vitamin D deficiency, unspecified: Secondary | ICD-10-CM | POA: Diagnosis not present

## 2017-03-11 DIAGNOSIS — E782 Mixed hyperlipidemia: Secondary | ICD-10-CM

## 2017-03-11 LAB — CBC WITH DIFFERENTIAL/PLATELET
BASOS ABS: 0 10*3/uL (ref 0.0–0.1)
BASOS PCT: 0.7 % (ref 0.0–3.0)
EOS ABS: 0.1 10*3/uL (ref 0.0–0.7)
Eosinophils Relative: 2.4 % (ref 0.0–5.0)
HEMATOCRIT: 38.9 % (ref 36.0–46.0)
Hemoglobin: 12.9 g/dL (ref 12.0–15.0)
LYMPHS PCT: 25.6 % (ref 12.0–46.0)
Lymphs Abs: 1.1 10*3/uL (ref 0.7–4.0)
MCHC: 33.1 g/dL (ref 30.0–36.0)
MCV: 89.8 fl (ref 78.0–100.0)
MONOS PCT: 8.5 % (ref 3.0–12.0)
Monocytes Absolute: 0.4 10*3/uL (ref 0.1–1.0)
Neutro Abs: 2.8 10*3/uL (ref 1.4–7.7)
Neutrophils Relative %: 62.8 % (ref 43.0–77.0)
Platelets: 248 10*3/uL (ref 150.0–400.0)
RBC: 4.33 Mil/uL (ref 3.87–5.11)
RDW: 14.1 % (ref 11.5–15.5)
WBC: 4.5 10*3/uL (ref 4.0–10.5)

## 2017-03-11 LAB — VITAMIN D 25 HYDROXY (VIT D DEFICIENCY, FRACTURES): VITD: 49.35 ng/mL (ref 30.00–100.00)

## 2017-03-11 LAB — HEPATIC FUNCTION PANEL
ALBUMIN: 4.4 g/dL (ref 3.5–5.2)
ALT: 12 U/L (ref 0–35)
AST: 16 U/L (ref 0–37)
Alkaline Phosphatase: 62 U/L (ref 39–117)
Bilirubin, Direct: 0.1 mg/dL (ref 0.0–0.3)
TOTAL PROTEIN: 7 g/dL (ref 6.0–8.3)
Total Bilirubin: 0.5 mg/dL (ref 0.2–1.2)

## 2017-03-11 LAB — BASIC METABOLIC PANEL
BUN: 17 mg/dL (ref 6–23)
CALCIUM: 10.1 mg/dL (ref 8.4–10.5)
CHLORIDE: 102 meq/L (ref 96–112)
CO2: 31 meq/L (ref 19–32)
CREATININE: 0.89 mg/dL (ref 0.40–1.20)
GFR: 66.7 mL/min (ref >60.00)
Glucose, Bld: 103 mg/dL — ABNORMAL HIGH (ref 70–99)
Potassium: 4.7 mEq/L (ref 3.5–5.1)
SODIUM: 139 meq/L (ref 135–145)

## 2017-03-11 LAB — LIPID PANEL
CHOL/HDL RATIO: 3
Cholesterol: 202 mg/dL — ABNORMAL HIGH (ref 0–200)
HDL: 68.3 mg/dL (ref >39.00)
LDL Cholesterol: 112 mg/dL — ABNORMAL HIGH (ref 0–99)
NonHDL: 133.22
TRIGLYCERIDES: 107 mg/dL (ref 0.0–149.0)
VLDL: 21.4 mg/dL (ref 0.0–40.0)

## 2017-03-11 LAB — TSH: TSH: 1.33 u[IU]/mL (ref 0.35–4.50)

## 2017-03-11 LAB — HEMOGLOBIN A1C: HEMOGLOBIN A1C: 6.1 % (ref 4.6–6.5)

## 2017-03-16 ENCOUNTER — Encounter: Payer: Self-pay | Admitting: Family Medicine

## 2017-03-16 ENCOUNTER — Ambulatory Visit (INDEPENDENT_AMBULATORY_CARE_PROVIDER_SITE_OTHER): Payer: Medicare Other | Admitting: Family Medicine

## 2017-03-16 VITALS — BP 124/82 | HR 98 | Temp 98.6°F | Wt 139.2 lb

## 2017-03-16 DIAGNOSIS — J018 Other acute sinusitis: Secondary | ICD-10-CM | POA: Diagnosis not present

## 2017-03-16 MED ORDER — AZITHROMYCIN 250 MG PO TABS
ORAL_TABLET | ORAL | 0 refills | Status: DC
Start: 2017-03-16 — End: 2017-06-01

## 2017-03-16 NOTE — Progress Notes (Signed)
   Subjective:    Patient ID: Nicole [name], female    DOB: 07-29-1946, 71 y.o.   MRN: [id number]  HPI Here for 4 days of sinus pressure and headache, PND, blowing green mucus from the nose, and a dry cough. No fever.    Review of Systems  Constitutional: Negative.   HENT: Positive for congestion, postnasal drip, sinus pain and sinus pressure. Negative for sore throat.   Eyes: Negative.   Respiratory: Positive for cough.        Objective:   Physical Exam  Constitutional: She appears well-developed and well-nourished.  HENT:  Right Ear: External ear normal.  Left Ear: External ear normal.  Nose: Nose normal.  Mouth/Throat: Oropharynx is clear and moist.  Eyes: Conjunctivae are normal.  Neck: No thyromegaly present.  Pulmonary/Chest: Effort normal and breath sounds normal. No respiratory distress. She has no wheezes. She has no rales.  Lymphadenopathy:    She has no cervical adenopathy.          Assessment & Plan:  Sinusitis, treat with a Zpack. Add Mucinex prn.  Alysia Penna, MD

## 2017-03-16 NOTE — Patient Instructions (Signed)
WE NOW OFFER   [facility] FAST TRACK!!!  SAME DAY Appointments for ACUTE CARE  Such as: Sprains, Injuries, cuts, abrasions, rashes, muscle pain, joint pain, back pain Colds, flu, sore throats, headache, allergies, cough, fever  Ear pain, sinus and eye infections Abdominal pain, nausea, vomiting, diarrhea, upset stomach Animal/insect bites  3 Easy Ways to Schedule: Walk-In Scheduling Call in scheduling Mychart Sign-up: [email or web address]         

## 2017-03-16 NOTE — Progress Notes (Signed)
Pre visit review using our clinic review tool, if applicable. No additional management support is needed unless otherwise documented below in the visit note. 

## 2017-03-17 NOTE — Progress Notes (Signed)
Chief Complaint  Patient presents with  . Annual Exam  . Hyperlipidemia    HPI: Patient  Nicole [name]  71 y.o. comes in today for Preventive Health Care visit   And AWEV LIPIDS  Taking lovastatin . No se   Not exercising as much recently  Left knee cartilage sx   lmimting some exercise  Concern about bg   cousiens with dm . Eats healthy  Needs refill acylcovir for hsv1 and recurrent "shingles " buttocks area   Uses topical and  ooral     Health Maintenance  Topic Date Due  . Hepatitis C Screening  09-Jun-1946  . INFLUENZA VACCINE  06/24/2017  . COLONOSCOPY  06/18/2021  . TETANUS/TDAP  04/14/2024  . DEXA SCAN  Completed  . PNA vac Low Risk Adult  Completed   Health Maintenance Review LIFESTYLE:  Exercise:  y Tobacco/ETS:n Alcohol: wine  To dec  Sugar beverages:n Sleep:7-8 hours  Plus  Drug use: no HH of 2  Husband stage 4 prostate cancer   Recurrent psa Safety adls memory Looking fro [place] [employer]   ROS:  GEN/ HEENT: No fever, significant weight changes sweats headaches vision problems hearing changes, CV/ PULM; No chest pain shortness of breath cough, syncope,edema  change in exercise tolerance. GI /GU: No adominal pain, vomiting, change in bowel habits. No blood in the stool. No significant GU symptoms. SKIN/HEME: ,no acute skin rashes suspicious lesions or bleeding. No lymphadenopathy, nodules, masses.  NEURO/ PSYCH:  No neurologic signs such as weakness numbness. No depression anxiety. IMM/ Allergy: No unusual infections.  Allergy .   REST of 12 system review negative except as per HPI   Past Medical History:  Diagnosis Date  . BRCA1 positive   . Breast cancer ([place])    Left- Ductal ca SR-, Chemo (Magrinat) rad rx   . GERD (gastroesophageal reflux disease)   . Headache(784.0)   . Hx of breast cancer   . Hypercholesteremia   . Rheumatoid factor positive    OA   had rheum consult    Past Surgical History:  Procedure Laterality Date  . BREAST LUMPECTOMY     . double masectomy    . LAPAROSCOPIC BILATERAL SALPINGO OOPHERECTOMY     Bilateral Simple mastectomies  . ovaries removed      Family History  Problem Relation Age of Onset  . Diabetes Mother   . Hypertension Mother   . Heart disease Mother   . Arthritis      maternal side RA cousin  . Kidney disease      maternal side  . Diabetes      1st degree relative, paternal side  . Diabetes Father   . Lung cancer Father     Social History   Social History  . Marital status: Married    Spouse name: N/A  . Number of children: N/A  . Years of education: N/A   Social History Main Topics  . Smoking status: Former Research scientist (life sciences)  . Smokeless tobacco: Never Used     Comment: limited in college   . Alcohol use 0.0 oz/week     Comment: one glass of wine on weekends  . Drug use: No  . Sexual activity: Not on file   Other Topics Concern  . Not on file   Social History Narrative   Married, household of 2. 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 [employer] release signed 04/17/10.      Husband with final stage  prostate cancer at this time.   hh of 2     Outpatient Medications Prior to Visit  Medication Sig Dispense Refill  . azithromycin (ZITHROMAX Z-PAK) 250 MG tablet As directed 6 each 0  . calcium carbonate (OS-CAL) 600 MG TABS Take 600 mg by mouth 3 (three) times daily with meals.      . fish oil-omega-3 fatty acids 1000 MG capsule Take 1 g by mouth daily.     . Magnesium 300 MG CAPS Take 1 capsule by mouth daily.     . ranitidine (ZANTAC) 150 MG tablet TAKE 1 TABLET BY MOUTH TWO TIMES A DAY 180 tablet 3  . Vitamin D, Cholecalciferol, 1000 UNITS CAPS Take by mouth.    Marland Kitchen acyclovir (ZOVIRAX) 200 MG capsule TAKE 2 CAPSULES BY MOUTH TWICE A DAY AS NEEDED FOR OUTBREAK 40 capsule 0  . acyclovir ointment (ZOVIRAX) 5 % Apply 1 application topically 3 (three) times daily as needed. 30 g 0  . lovastatin (MEVACOR) 20 MG tablet TAKE 1 TABLET BY MOUTH ONCE A DAY 30  tablet 0  . albuterol (PROVENTIL HFA;VENTOLIN HFA) 108 (90 BASE) MCG/ACT inhaler Inhale 2 puffs into the lungs every 4 (four) hours as needed for wheezing or shortness of breath. (Patient not taking: Reported on 03/18/2017) 1 Inhaler 0   No facility-administered medications prior to visit.      EXAM:  BP 130/80 (BP Location: Right Arm, Patient Position: Lying right side, Cuff Size: Normal)   Pulse 80   Ht 5' 2"  (1.575 m)   Wt 138 lb (62.6 kg)   SpO2 98%   BMI 25.24 kg/m   Body mass index is 25.24 kg/m. Wt Readings from Last 3 Encounters:  03/18/17 138 lb (62.6 kg)  03/16/17 139 lb 3.2 oz (63.1 kg)  03/17/16 137 lb 14.4 oz (62.6 kg)    Physical Exam: Vital signs reviewed MVE:HMCN is a well-developed well-nourished alert cooperative    who appearsr stated age in no acute distress.  HEENT: normocephalic atraumatic , Eyes: PERRL EOM's full, conjunctiva clear, Nares: paten,t no deformity discharge or tenderness., Ears: no deformity EAC's clear TMs with normal landmarks. Mouth: clear OP, no lesions, edema.  Moist mucous membranes. Dentition in adequate repair. NECK: supple without masses, thyromegaly or bruits. CHEST/PULM:  Clear to auscultation and percussion breath sounds equal no wheeze , rales or rhonchi. No chest wall deformities or tenderness. Breast:absent . CV: PMI is nondisplaced, S1 S2 no gallops, murmurs, rubs. Peripheral pulses are full without delay.No JVD .  ABDOMEN: Bowel sounds normal nontender  No guard or rebound, no hepato splenomegal no CVA tenderness.  No hernia. Extremtities:  No clubbing cyanosis or edema, no acute joint swelling or redness no focal atrophy NEURO:  Oriented x3, cranial nerves 3-12 appear to be intact, no obvious focal weakness,gait within normal limits no abnormal reflexes or asymmetrical SKIN: No acute rashes normal turgor, color, no bruising or petechiae. PSYCH: Oriented, good eye contact, no obvious depression anxiety, cognition and judgment  appear normal. LN: no cervical axillary inguinal adenopathy  Lab Results  Component Value Date   WBC 4.5 03/11/2017   HGB 12.9 03/11/2017   HCT 38.9 03/11/2017   PLT 248.0 03/11/2017   GLUCOSE 103 (H) 03/11/2017   CHOL 202 (H) 03/11/2017   TRIG 107.0 03/11/2017   HDL 68.30 03/11/2017   LDLDIRECT 119.9 12/30/2012   LDLCALC 112 (H) 03/11/2017   ALT 12 03/11/2017   AST 16 03/11/2017   NA 139 03/11/2017  K 4.7 03/11/2017   CL 102 03/11/2017   CREATININE 0.89 03/11/2017   BUN 17 03/11/2017   CO2 31 03/11/2017   TSH 1.33 03/11/2017   HGBA1C 6.1 03/11/2017    BP Readings from Last 3 Encounters:  03/18/17 130/80  03/16/17 124/82  03/17/16 118/78    Lab results reviewed with patient   ASSESSMENT AND PLAN:  Discussed the following assessment and plan:  Visit for preventive health examination  Medication management  BRCA1 positive  Mixed hyperlipidemia  Encounter for hepatitis C screening test for low risk patient - Plan: Hepatitis C Antibody  Other problems related to lifestyle - Plan: Hepatitis C Antibody  Fasting hyperglycemia - plan    a1c in 6 mos  6.1 today  - Plan: Hemoglobin A1c Low risk hep c but can screen and  Plan  recheck a1c NO visit in about 6 mos  Counseled.  Disc risks bnefit and unknown of supplements  Patient Care Team: Burnis Medin, MD as PCP - General Bo Merino, MD (Rheumatology) Ronald Lobo, MD (Gastroenterology) Phylliss Bob, MD as Consulting Physician (Orthopedic Surgery) Milly Jakob, MD as Consulting Physician (Orthopedic Surgery) Marygrace Drought, MD as Consulting Physician (Ophthalmology) Patient Instructions    Intensify lifestyle interventions.  For lipids and blood sugar help.    Refilling the  Zovirax for your.  A1c  in 6 months and  Hep c  Screen .  After lifestyle  changes .      Ms. Caroll , Thank you for taking time to come for your Medicare Wellness Visit. I appreciate your ongoing commitment to your  health goals. Please review the following plan we discussed and let me know if I can assist you in the future.   Can go to [email or web address].org and search for pre-diabetes for more information   Educated regarding prediabetes and numbers;  A1c ranges from 5.8 to 6.5 or fasting Blood sugar > 115 -126; (126 is diabetic)   Risk: >45yo; family hx; overweight or obese; African American; Hispanic; Latino; American [place] American; Lignite; history of diabetes when pregnant; or birth to a baby weighing over 9 lbs. Being less physically active than 30 minutes; 3 times a week;   Prevention; Losing a modest 7 to 8 lbs; If over 200 lbs; 10 to 14 lbs;  Choose healthier foods; colorful veggies; fish or lean meats; drinks water Reduce portion size Start exercising; 30 minutes of fast walking x 30 minutes per day/ 60 min for weight loss   Tinnitus association - [email or web address]   These are the goals we discussed: Goals    . Weight (lb) < 138 lb (62.6 kg)          Keep going to weight watchers;  Check out  online nutrition programs as GumSearch.nl and http://vang.com/; fit78m; Look for foods with "whole" wheat; bran; oatmeal etc Shot at the farmer's markets in season for fresher choices  Watch for "hydrogenated" on the label of oils which are trans-fats.  Watch for "high fructose corn syrup" in snacks, yogurt or ketchup  Meats have less marbling; bright colored fruits and vegetables;  Canned; dump out liquid and wash vegetables. Be mindful of what we are eating  Portion control is essential to a health weight! Sit down; take a break and enjoy your meal; take smaller bites; put the fork down between bites;  It takes 20 minutes to get full; so check in with your fullness cues and stop eating when you start to fill full  This is a list of the screening recommended for you and due dates:  Health Maintenance  Topic Date Due  .  Hepatitis C: One time screening  is recommended by Center for Disease Control  (CDC) for  adults born from [birth year] through 1964.   06-29-1946  . Flu Shot  06/24/2017  . Colon Cancer Screening  06/18/2021  . Tetanus Vaccine  04/14/2024  . DEXA scan (bone density measurement)  Completed  . Pneumonia vaccines  Completed       [name]. Aristide Waggle M.D.

## 2017-03-18 ENCOUNTER — Ambulatory Visit (INDEPENDENT_AMBULATORY_CARE_PROVIDER_SITE_OTHER): Payer: Medicare Other | Admitting: Internal Medicine

## 2017-03-18 ENCOUNTER — Encounter: Payer: Self-pay | Admitting: Internal Medicine

## 2017-03-18 VITALS — BP 130/80 | HR 80 | Ht 62.0 in | Wt 138.0 lb

## 2017-03-18 DIAGNOSIS — E782 Mixed hyperlipidemia: Secondary | ICD-10-CM | POA: Diagnosis not present

## 2017-03-18 DIAGNOSIS — Z1159 Encounter for screening for other viral diseases: Secondary | ICD-10-CM

## 2017-03-18 DIAGNOSIS — Z0000 Encounter for general adult medical examination without abnormal findings: Secondary | ICD-10-CM

## 2017-03-18 DIAGNOSIS — Z79899 Other long term (current) drug therapy: Secondary | ICD-10-CM | POA: Diagnosis not present

## 2017-03-18 DIAGNOSIS — Z1501 Genetic susceptibility to malignant neoplasm of breast: Secondary | ICD-10-CM

## 2017-03-18 DIAGNOSIS — Z1509 Genetic susceptibility to other malignant neoplasm: Secondary | ICD-10-CM

## 2017-03-18 DIAGNOSIS — Z7289 Other problems related to lifestyle: Secondary | ICD-10-CM

## 2017-03-18 DIAGNOSIS — R7301 Impaired fasting glucose: Secondary | ICD-10-CM

## 2017-03-18 MED ORDER — ACYCLOVIR 200 MG PO CAPS: ORAL_CAPSULE | ORAL | 6 refills | Status: DC

## 2017-03-18 MED ORDER — ACYCLOVIR 5 % EX OINT: 1.0000 "application " | TOPICAL_OINTMENT | Freq: Three times a day (TID) | CUTANEOUS | 0 refills | Status: DC | PRN

## 2017-03-18 MED ORDER — LOVASTATIN 20 MG PO TABS
20.0000 mg | ORAL_TABLET | Freq: Every day | ORAL | 3 refills | Status: DC
Start: 2017-03-18 — End: 2018-03-24

## 2017-03-18 NOTE — Progress Notes (Signed)
Subjective:   Nicole [name] is a 71 y.o. female who presents for Medicare Annual (Subsequent) preventive examination.  The Patient was informed that the wellness visit is to identify future health risk and educate and initiate measures that can reduce risk for increased disease through the lifespan.    NO ROS; Medicare Wellness Visit  Describes health as good, fair or great? good  Husband has terminal cancer; prostate cancer Has aggressive cancer  [facility] - trying new experimental drug  Planning trip   Preventive Screening -Counseling & Management  Mammogram; Double mastectomy  BRCA 1 and removed both breast   Ovaries out - cancer 99  Brca 1 in 05 and surgery 06;  Double cousins had repeat breast cancer  Had radiation and not a candidate of for plastic surgery   Sister had CF and decided not to have children Brother had HD   Colonoscopy 07/012 - repeat 10 years Bone density 09/2016  (-1.7)  Vit D 49  No GYN for now Pap 2008  Smoking history - former smoker but very remote use in college   ETOH - one glass on weekends starting now  Was drinking 1 per day but concerned about A1c    Medication adherence or issues?  No Taking Vit A and Vit e occasionally D3 and mag every day  RISK FACTORS Diet Lots of fresh fruit and vegetables No bread Does not eat fried foods  Occasional candy bar  Now in weight watchers    Regular exercise -  Does dance; zumba  International folk dancing  And walks some  More sedentary recently due to knee injury but it is healing Tear in cartilage but did not want surgery;   Cardiac Risk Factors:  Advanced aged >52 in women Hyperlipidemia /-02/2017; cho 202; HDL 68; trig 107  Diabetes - A1c is 6.1   Family History - DM, HTN; HD; lung cancer Obesity - neg  Fall risk  No Very active lifestyle   Mobility of Functional changes this year? no  Mental Health:  Any emotional problems? Anxious, depressed, irritable, sad or  blue? no Denies feeling depressed or hopeless; voices pleasure in daily life How many social activities have you been engaged in within the last 2 weeks? no States this is life and she deals with it as it comes  Hearing Screening Comments: Hearing issues; Tinnitus since she was 89  Vision Screening Comments: Eye exam scheduled for May Dr. Satira Sark; [facility]  Given the tinnitus foundation   Activities of Daily Living - See functional screen   Cognitive testing; Ad8 score; 0 or less than 2  MMSE deferred or completed if AD8 + 2 issues  Advanced Directives completed w spouse  Patient Care Team: Burnis Medin, MD as PCP - General Bo Merino, MD (Rheumatology) Ronald Lobo, MD (Gastroenterology) Phylliss Bob, MD as Consulting Physician (Orthopedic Surgery) Milly Jakob, MD as Consulting Physician (Orthopedic Surgery) Marygrace Drought, MD as Consulting Physician (Ophthalmology)    Immunization History  Administered Date(s) Administered  . Influenza Split 10/08/2011  . Influenza Whole 10/13/2007, 08/14/2009  . Influenza,inj,Quad PF,36+ Mos 03/06/2015  . Pneumococcal Conjugate-13 01/10/2013  . Pneumococcal Polysaccharide-23 04/14/2014  . Td 11/24/2002  . Tetanus 04/14/2014   Required Immunizations needed today  Screening test up to date or reviewed for plan of completion Health Maintenance Due  Topic Date Due  . Hepatitis C Screening  May 29, 1946    Cardiac Risk Factors include: advanced age (>55mn, >>27women);hypertension  Objective:     Vitals: BP 130/80 (BP Location: Right Arm, Patient Position: Lying right side, Cuff Size: Normal)   Pulse 80   Ht 5\' 2"  (1.575 m)   Wt 138 lb (62.6 kg)   SpO2 98%   BMI 25.24 kg/m   Body mass index is 25.24 kg/m.   Tobacco History  Smoking Status  . Former Smoker  Smokeless Tobacco  . Never Used    Comment: limited in college      Counseling given: Not Answered   Past Medical History:  Diagnosis  Date  . BRCA1 positive   . Breast cancer ([facility])    Left- Ductal ca SR-, Chemo (Magrinat) rad rx   . GERD (gastroesophageal reflux disease)   . Headache(784.0)   . Hx of breast cancer   . Hypercholesteremia   . Rheumatoid factor positive    OA   had rheum consult   Past Surgical History:  Procedure Laterality Date  . BREAST LUMPECTOMY    . double masectomy    . LAPAROSCOPIC BILATERAL SALPINGO OOPHERECTOMY     Bilateral Simple mastectomies  . ovaries removed     Family History  Problem Relation Age of Onset  . Diabetes Mother   . Hypertension Mother   . Heart disease Mother   . Arthritis      maternal side RA cousin  . Kidney disease      maternal side  . Diabetes      1st degree relative, paternal side  . Diabetes Father   . Lung cancer Father    History  Sexual Activity  . Sexual activity: Not on file    Outpatient Encounter Prescriptions as of 03/18/2017  Medication Sig  . acyclovir (ZOVIRAX) 200 MG capsule TAKE 2 CAPSULES BY MOUTH TWICE A DAY AS NEEDED FOR OUTBREAK  . acyclovir ointment (ZOVIRAX) 5 % Apply 1 application topically 3 (three) times daily as needed.  03/20/2017 azithromycin (ZITHROMAX Z-PAK) 250 MG tablet As directed  . calcium carbonate (OS-CAL) 600 MG TABS Take 600 mg by mouth 3 (three) times daily with meals.    . fish oil-omega-3 fatty acids 1000 MG capsule Take 1 g by mouth daily.   Marland Kitchen lovastatin (MEVACOR) 20 MG tablet TAKE 1 TABLET BY MOUTH ONCE A DAY  . Magnesium 300 MG CAPS Take 1 capsule by mouth daily.   . ranitidine (ZANTAC) 150 MG tablet TAKE 1 TABLET BY MOUTH TWO TIMES A DAY  . Vitamin D, Cholecalciferol, 1000 UNITS CAPS Take by mouth.  Marland Kitchen albuterol (PROVENTIL HFA;VENTOLIN HFA) 108 (90 BASE) MCG/ACT inhaler Inhale 2 puffs into the lungs every 4 (four) hours as needed for wheezing or shortness of breath. (Patient not taking: Reported on 03/18/2017)  . [DISCONTINUED] amoxicillin (AMOXIL) 875 MG tablet Take 1 tablet (875 mg total) by mouth 3 (three) times  daily.  . [DISCONTINUED] lovastatin (MEVACOR) 20 MG tablet TAKE 1 TABLET BY MOUTH ONCE A DAY   No facility-administered encounter medications on file as of 03/18/2017.     Activities of Daily Living In your present state of health, do you have any difficulty performing the following activities: 03/18/2017  Hearing? (No Data)  Vision? N  Difficulty concentrating or making decisions? (No Data)  Walking or climbing stairs? N  Dressing or bathing? N  Doing errands, shopping? N  Preparing Food and eating ? N  Using the Toilet? N  In the past six months, have you accidently leaked urine? N  Do you have problems with  loss of bowel control? N  Managing your Medications? N  Managing your Finances? N  Housekeeping or managing your Housekeeping? N  Some recent data might be hidden    Patient Care Team: Burnis Medin, MD as PCP - General Bo Merino, MD (Rheumatology) Ronald Lobo, MD (Gastroenterology) Phylliss Bob, MD as Consulting Physician (Orthopedic Surgery) Milly Jakob, MD as Consulting Physician (Orthopedic Surgery) Marygrace Drought, MD as Consulting Physician (Ophthalmology)    Assessment:     Exercise Activities and Dietary recommendations Current Exercise Habits: Home exercise routine;Structured exercise class, Time (Minutes): > 60, Frequency (Times/Week): 4, Weekly Exercise (Minutes/Week): 0, Intensity: Moderate  Goals    . Weight (lb) < 138 lb (62.6 kg)          Keep going to weight watchers;  Check out  online nutrition programs as GumSearch.nl and http://vang.com/; fit52m; Look for foods with "whole" wheat; bran; oatmeal etc Shot at the farmer's markets in season for fresher choices  Watch for "hydrogenated" on the label of oils which are trans-fats.  Watch for "high fructose corn syrup" in snacks, yogurt or ketchup  Meats have less marbling; bright colored fruits and vegetables;  Canned; dump out liquid and wash vegetables. Be mindful of what we are  eating  Portion control is essential to a health weight! Sit down; take a break and enjoy your meal; take smaller bites; put the fork down between bites;  It takes 20 minutes to get full; so check in with your fullness cues and stop eating when you start to fill full              Fall Risk Fall Risk  03/18/2017 03/17/2016 03/06/2015 03/06/2015 04/14/2014  Falls in the past year? No No - Yes No  Number falls in past yr: - - (No Data) 1 -  Injury with Fall? - - Yes Yes -   Depression Screen PHQ 2/9 Scores 03/18/2017 03/17/2016 03/06/2015 04/14/2014  PHQ - 2 Score 0 0 0 0     Cognitive Function MMSE - Mini Mental State Exam 03/18/2017  Not completed: (No Data)        Immunization History  Administered Date(s) Administered  . Influenza Split 10/08/2011  . Influenza Whole 10/13/2007, 08/14/2009  . Influenza,inj,Quad PF,36+ Mos 03/06/2015  . Pneumococcal Conjugate-13 01/10/2013  . Pneumococcal Polysaccharide-23 04/14/2014  . Td 11/24/2002  . Tetanus 04/14/2014   Screening Tests Health Maintenance  Topic Date Due  . Hepatitis C Screening  [id number] . INFLUENZA VACCINE  06/24/2017  . COLONOSCOPY  06/18/2021  . TETANUS/TDAP  04/14/2024  . DEXA SCAN  Completed  . PNA vac Low Risk Adult  Completed      Plan:    PCP Notes  Health Maintenance Would like to have Hep c drawn  Abnormal Screens   Referrals   Patient concerns; very health conscious  Concerned regarding A1c and was educated regarding pre diabetes; brisk walks x 30 minutes 5 days a week and continues to enjoy exercise   Nurse Concerns;  Next PCP apt today  During the course of the visit the patient was educated and counseled about the following appropriate screening and preventive services:   Vaccines to include Pneumoccal, Influenza, Hepatitis B, Td, Zostavax, HCV  Electrocardiogram  Cardiovascular Disease  Colorectal cancer screening  Bone density screening osteopenia   Dexa 11/17 and will  repeat 12/ 19   Diabetes screening - aware and cutting back on ETOH and sugar; weight loss  Glaucoma screening - vision  checks annual   Mammography/n/a   Nutrition counseling   Patient Instructions (the written plan) was given to the patient.   Wynetta Fines, RN  03/18/2017

## 2017-03-18 NOTE — Patient Instructions (Addendum)
  Intensify lifestyle interventions.  For lipids and blood sugar help.    Refilling the  Zovirax for your.  A1c  in 6 months and  Hep c  Screen .  After lifestyle  changes .      Ms. Mair , Thank you for taking time to come for your Medicare Wellness Visit. I appreciate your ongoing commitment to your health goals. Please review the following plan we discussed and let me know if I can assist you in the future.   Can go to diabetes.[email or web address] and search for pre-diabetes for more information   Educated regarding prediabetes and numbers;  A1c ranges from 5.8 to 6.5 or fasting Blood sugar > 115 -126; (126 is diabetic)   Risk: >45yo; family hx; overweight or obese; African American; Hispanic; Latino; American [place] American; [place]; history of diabetes when pregnant; or birth to a baby weighing over 9 lbs. Being less physically active than 30 minutes; 3 times a week;   Prevention; Losing a modest 7 to 8 lbs; If over 200 lbs; 10 to 14 lbs;  Choose healthier foods; colorful veggies; fish or lean meats; drinks water Reduce portion size Start exercising; 30 minutes of fast walking x 30 minutes per day/ 60 min for weight loss   Tinnitus association - [email or web address]   These are the goals we discussed: Goals    . Weight (lb) < 138 lb (62.6 kg)          Keep going to weight watchers;  Check out  online nutrition programs as GumSearch.nl and http://vang.com/; fit54me; Look for foods with "whole" wheat; bran; oatmeal etc Shot at the farmer's markets in season for fresher choices  Watch for "hydrogenated" on the label of oils which are trans-fats.  Watch for "high fructose corn syrup" in snacks, yogurt or ketchup  Meats have less marbling; bright colored fruits and vegetables;  Canned; dump out liquid and wash vegetables. Be mindful of what we are eating  Portion control is essential to a health weight! Sit down; take a break and enjoy your meal; take smaller bites;  put the fork down between bites;  It takes 20 minutes to get full; so check in with your fullness cues and stop eating when you start to fill full               This is a list of the screening recommended for you and due dates:  Health Maintenance  Topic Date Due  .  Hepatitis C: One time screening is recommended by Center for Disease Control  (CDC) for  adults born from [birth year] through 1964.   [birth year]  . Flu Shot  06/24/2017  . Colon Cancer Screening  06/18/2021  . Tetanus Vaccine  04/14/2024  . DEXA scan (bone density measurement)  Completed  . Pneumonia vaccines  Completed

## 2017-04-07 NOTE — Telephone Encounter (Signed)
Called to see if she wanted to schedule awv - left message ( here on 4/25 - come in at 3? )

## 2017-04-16 ENCOUNTER — Other Ambulatory Visit: Payer: Medicare Other

## 2017-04-21 ENCOUNTER — Encounter: Payer: Medicare Other | Admitting: Internal Medicine

## 2017-04-24 ENCOUNTER — Other Ambulatory Visit: Payer: Medicare Other

## 2017-04-28 ENCOUNTER — Encounter: Payer: Medicare Other | Admitting: Internal Medicine

## 2017-06-01 ENCOUNTER — Encounter: Payer: Self-pay | Admitting: Internal Medicine

## 2017-06-01 ENCOUNTER — Ambulatory Visit (INDEPENDENT_AMBULATORY_CARE_PROVIDER_SITE_OTHER): Payer: Medicare Other | Admitting: Internal Medicine

## 2017-06-01 VITALS — BP 130/70 | HR 100 | Temp 98.6°F | Wt 136.2 lb

## 2017-06-01 DIAGNOSIS — R0789 Other chest pain: Secondary | ICD-10-CM | POA: Diagnosis not present

## 2017-06-01 DIAGNOSIS — R222 Localized swelling, mass and lump, trunk: Secondary | ICD-10-CM

## 2017-06-01 DIAGNOSIS — Z1501 Genetic susceptibility to malignant neoplasm of breast: Secondary | ICD-10-CM

## 2017-06-01 DIAGNOSIS — Z1509 Genetic susceptibility to other malignant neoplasm: Secondary | ICD-10-CM

## 2017-06-01 DIAGNOSIS — Z853 Personal history of malignant neoplasm of breast: Secondary | ICD-10-CM | POA: Diagnosis not present

## 2017-06-01 NOTE — Progress Notes (Signed)
Chief Complaint  Patient presents with  . Mass    HPI: Nicole [name] 71 y.o.  sda  For new problem  Most recently she felt a lump at the inferior part of her sternum that comes and goes and sometimes is tight and tender when she does heavy lifting. Her husband advised to come in and get it checked she doesn't feel very much today. No history of trauma she does have a history of breast cancer and breast cancer gene. She originally felt this area by doing a chest wall exam.  ROS: See pertinent positives and negatives per HPI.  Past Medical History:  Diagnosis Date  . BRCA1 positive   . Breast cancer ([place])    Left- Ductal ca SR-, Chemo (Magrinat) rad rx   . GERD (gastroesophageal reflux disease)   . Headache(784.0)   . Hx of breast cancer   . Hypercholesteremia   . Rheumatoid factor positive    OA   had rheum consult    Family History  Problem Relation Age of Onset  . Diabetes Mother   . Hypertension Mother   . Heart disease Mother   . Arthritis Unknown        maternal side RA cousin  . Kidney disease Unknown        maternal side  . Diabetes Unknown        1st degree relative, paternal side  . Diabetes Father   . Lung cancer Father     Social History   Social History  . Marital status: Married    Spouse name: N/A  . Number of children: N/A  . Years of education: N/A   Social History Main Topics  . Smoking status: Former Research scientist (life sciences)  . Smokeless tobacco: Never Used     Comment: limited in college   . Alcohol use 0.0 oz/week     Comment: one glass of wine on weekends  . Drug use: No  . Sexual activity: Not Asked   Other Topics Concern  . None   Social History Narrative   Married, household of 2. 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 Designated Party release signed 04/17/10.      Husband with final stage prostate cancer at this time.   hh of 2     Outpatient Medications Prior to Visit  Medication Sig Dispense Refill  . acyclovir  (ZOVIRAX) 200 MG capsule TAKE 2 CAPSULES BY MOUTH three time per day  FOR OUTBREAK. 60 capsule 6  . acyclovir ointment (ZOVIRAX) 5 % Apply 1 application topically 3 (three) times daily as needed. 30 g 0  . albuterol (PROVENTIL HFA;VENTOLIN HFA) 108 (90 BASE) MCG/ACT inhaler Inhale 2 puffs into the lungs every 4 (four) hours as needed for wheezing or shortness of breath. 1 Inhaler 0  . calcium carbonate (OS-CAL) 600 MG TABS Take 600 mg by mouth 3 (three) times daily with meals.      . fish oil-omega-3 fatty acids 1000 MG capsule Take 1 g by mouth daily.     Marland Kitchen lovastatin (MEVACOR) 20 MG tablet Take 1 tablet (20 mg total) by mouth daily. 90 tablet 3  . Magnesium 300 MG CAPS Take 1 capsule by mouth daily.     . ranitidine (ZANTAC) 150 MG tablet TAKE 1 TABLET BY MOUTH TWO TIMES A DAY 180 tablet 3  . Vitamin D, Cholecalciferol, 1000 UNITS CAPS Take by mouth.    Marland Kitchen azithromycin (ZITHROMAX Z-PAK) 250 MG tablet As directed 6  each 0   No facility-administered medications prior to visit.      EXAM:  BP 130/70 (BP Location: Right Arm, Patient Position: Sitting, Cuff Size: Normal)   Pulse 100   Temp 98.6 F (37 C) (Oral)   Wt 136 lb 3.2 oz (61.8 kg)   BMI 24.91 kg/m   Body mass index is 24.91 kg/m.  GENERAL: vitals reviewed and listed above, alert, oriented, appears well hydrated and in no acute distress HEENT: atraumatic, conjunctiva  clear, no obvious abnormalities on inspection of external nose and ears NECK: no obvious masses on inspection palpation   Chest exam shows breast absent no bony lesions on the chest wall rib cage. Just inferior to the sternum at the xiphoid there is a subtle lump of fat that is not discrete minimally tender no redness no hernia examined standing and laying. Abdomen soft without organomegaly guarding or rebound skin no acute rashes Axilla without adenopathy. MS: moves all extremities without noticeable focal  abnormality PSYCH: pleasant and cooperative, no obvious  depression or anxiety  ASSESSMENT AND PLAN:  Discussed the following assessment and plan:  Lump in chest - seems xyphoid    Xyphoidalgia  BRCA1 positive  Personal history of malignant neoplasm of breast   Expectant management. This is most likely benign xiphoid exam discussed with patient will follow if progressive increasing in size imaging studies could include plain x-ray or CT MRI but I doubt this is needed this time as I suspect a very benign process. -Patient advised to return or notify health care team  if symptoms worsen ,persist or new concerns arise.  Patient Instructions  I think this is xiphoid process possibly slightly inflamed and not a mass. However because of your risk if you find any increase in size or new symptoms we can do an imaging study.       [name]. Barbarajean Kinzler M.D.

## 2017-06-01 NOTE — Patient Instructions (Signed)
I think this is xiphoid process possibly slightly inflamed and not a mass. However because of your risk if you find any increase in size or new symptoms we can do an imaging study.

## 2017-08-14 ENCOUNTER — Encounter: Payer: Self-pay | Admitting: Internal Medicine

## 2018-01-18 ENCOUNTER — Encounter: Payer: Self-pay | Admitting: Internal Medicine

## 2018-01-18 NOTE — Telephone Encounter (Signed)
Please advise Dr [name], thanks.   

## 2018-01-19 ENCOUNTER — Ambulatory Visit (INDEPENDENT_AMBULATORY_CARE_PROVIDER_SITE_OTHER): Payer: Medicare Other | Admitting: Family Medicine

## 2018-01-19 DIAGNOSIS — Z23 Encounter for immunization: Secondary | ICD-10-CM

## 2018-01-19 NOTE — Telephone Encounter (Signed)
Ok to send in lorazepam  0.5 mg  Take 1-2 every 12 hours as needed for anxiety   With travel  Disp : 30 no refills

## 2018-01-20 MED ORDER — LORAZEPAM 0.5 MG PO TABS: 0.5000 mg | ORAL_TABLET | Freq: Two times a day (BID) | ORAL | 0 refills | Status: DC | PRN

## 2018-01-20 NOTE — Telephone Encounter (Signed)
Rx phoned in to pharmacy.  Pt notified.  Nothing further needed.

## 2018-02-02 ENCOUNTER — Encounter: Payer: Self-pay | Admitting: Internal Medicine

## 2018-02-03 MED ORDER — CIPROFLOXACIN HCL 500 MG PO TABS: 500.0000 mg | ORAL_TABLET | Freq: Two times a day (BID) | ORAL | 0 refills | Status: DC

## 2018-03-21 NOTE — Progress Notes (Signed)
Chief Complaint  Patient presents with  . Annual Exam    No new concerns    HPI: Nicole [name] 72 y.o. comes in today for Preventive Medicare exam/ wellness visit .  3 weeks  [place]   Travel  Forgot to take reg  meds some during trip did use cipro for sinus infection .  hasnt gotten to gyne yet    For brca1 gene and surveillance  Taking statin  No se  Had fracture foot  In fall      Health Maintenance  Topic Date Due  . Hepatitis C Screening  [birth year]  . INFLUENZA VACCINE  06/24/2018  . COLONOSCOPY  06/18/2021  . TETANUS/TDAP  04/14/2024  . DEXA SCAN  Completed  . PNA vac Low Risk Adult  Completed   Health Maintenance Review LIFESTYLE:  Exercise:   Before foot fracture   In   august    Had brick   Right.  August ... Back to walking  Garden  Tobacco/ETS: no Alcohol:  1+ per day Sugar beverages: try not   3-4 time per week  Sleep:  6-8  Drug use: no HH:  2   No pets    Hearing:  Ok   Vision:  No limitations at present . Last eye check UTD glasses   Safety:  Has smoke detector and wears seat belts.  No firearms. No excess sun exposure. Sees dentist regularly.  Falls:  See   Foot injury    Memory: Felt to be good  , no concern from her or her family.  Depression: No anhedonia unusual crying or depressive symptoms  Nutrition: Eats well balanced diet; adequate calcium and vitamin D. No swallowing chewing problems.  Other healthcare providers:  Reviewed today .   Preventive parameters: uncertain when colon due   Reviewed  Last dexa less than 2 years   Osteopenia   ADLS:   There are no problems or need for assistance  driving, feeding, obtaining food, dressing, toileting and bathing, managing money using phone. She is independent.   ROS:  GEN/ HEENT: No fever, significant weight changes sweats headaches vision problems hearing changes, CV/ PULM; No chest pain shortness of breath cough, syncope,edema  change in exercise tolerance. GI /GU: No adominal pain,  vomiting, change in bowel habits. No blood in the stool. No significant GU symptoms. SKIN/HEME: ,no acute skin rashes suspicious lesions or bleeding. No lymphadenopathy, nodules, masses.  NEURO/ PSYCH:  No neurologic signs such as weakness numbness. No depression anxiety. IMM/ Allergy: No unusual infections.  Allergy .   REST of 12 system review negative except as per HPI   Past Medical History:  Diagnosis Date  . BRCA1 positive   . Breast cancer ([place])    Left- Ductal ca SR-, Chemo (Magrinat) rad rx   . GERD (gastroesophageal reflux disease)   . Headache(784.0)   . Hx of breast cancer   . Hypercholesteremia   . Rheumatoid factor positive    OA   had rheum consult    Family History  Problem Relation Age of Onset  . Diabetes Mother   . Hypertension Mother   . Heart disease Mother   . Arthritis Unknown        maternal side RA cousin  . Kidney disease Unknown        maternal side  . Diabetes Unknown        1st degree relative, paternal side  . Diabetes Father   . Lung cancer  Father     Social History   Socioeconomic History  . Marital status: Married    Spouse name: Not on file  . Number of children: Not on file  . Years of education: Not on file  . Highest education level: Not on file  Occupational History  . Not on file  Social Needs  . Financial resource strain: Not on file  . Food insecurity:    Worry: Not on file    Inability: Not on file  . Transportation needs:    Medical: Not on file    Non-medical: Not on file  Tobacco Use  . Smoking status: Former Research scientist (life sciences)  . Smokeless tobacco: Never Used  . Tobacco comment: limited in college   Substance and Sexual Activity  . Alcohol use: Yes    Alcohol/week: 0.0 oz    Comment: one glass of wine on weekends  . Drug use: No  . Sexual activity: Not on file  Lifestyle  . Physical activity:    Days per week: Not on file    Minutes per session: Not on file  . Stress: Not on file  Relationships  . Social  connections:    Talks on phone: Not on file    Gets together: Not on file    Attends religious service: Not on file    Active member of club or organization: Not on file    Attends meetings of clubs or organizations: Not on file    Relationship status: Not on file  Other Topics Concern  . Not on file  Social History Narrative   Married, household of 2. 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 Designated Party release signed 04/17/10.      Husband with final stage prostate cancer at this time.   hh of 2     Allergies as of 03/24/2018      Reactions   Sulfonamide Derivatives Nausea And Vomiting   Latex Rash      Medication List        Accurate as of 03/24/18  7:21 PM. Always use your most recent med list.          acyclovir 200 MG capsule Commonly known as:  ZOVIRAX TAKE 2 CAPSULES BY MOUTH three time per day  FOR OUTBREAK.   acyclovir ointment 5 % Commonly known as:  ZOVIRAX Apply 1 application topically 3 (three) times daily as needed.   calcium carbonate 600 MG Tabs tablet Commonly known as:  OS-CAL Take 600 mg by mouth 3 (three) times daily with meals.   fish oil-omega-3 fatty acids 1000 MG capsule Take 1 g by mouth daily.   lovastatin 20 MG tablet Commonly known as:  MEVACOR Take 1 tablet (20 mg total) by mouth daily.   Magnesium 300 MG Caps Take 1 capsule by mouth daily.   ranitidine 150 MG tablet Commonly known as:  ZANTAC TAKE 1 TABLET BY MOUTH TWO TIMES A DAY   Vitamin D (Cholecalciferol) 1000 units Caps Take by mouth.        EXAM:  BP 124/82 (BP Location: Right Arm, Patient Position: Sitting, Cuff Size: Normal)   Pulse 63   Temp 98 F (36.7 C) (Oral)   Ht 5' 1.5" (1.562 m)   Wt 135 lb 8 oz (61.5 kg)   BMI 25.19 kg/m   Body mass index is 25.19 kg/m.  Physical Exam: Vital signs reviewed CNO:BSJG is a well-developed well-nourished alert cooperative   who appears stated age in no  acute distress.  HEENT: normocephalic  atraumatic , Eyes: PERRL EOM's full, conjunctiva clear, Nares: paten,t no deformity discharge or tenderness., Ears: no deformity EAC's clear TMs with normal landmarks. Mouth: clear OP, no lesions, edema.  Moist mucous membranes. Dentition in adequate repair. NECK: supple without masses, thyromegaly or bruits. CHEST/PULM:  Clear to auscultation and percussion breath sounds equal no wheeze , rales or rhonchi. No chest wall deformities or tenderness.  Breasts absent    CV: PMI is nondisplaced, S1 S2 no gallops, murmurs, rubs. Peripheral pulses are full without delay.No JVD .  ABDOMEN: Bowel sounds normal nontender  No guard or rebound, no hepato splenomegal no CVA tenderness.   Extremtities:  No clubbing cyanosis or edema, no acute joint swelling or redness no focal atrophy NEURO:  Oriented x3, cranial nerves 3-12 appear to be intact, no obvious focal weakness,gait within normal limits no abnormal reflexes or asymmetrical SKIN: No acute rashes normal turgor, color, no bruising or petechiae. PSYCH: Oriented, good eye contact, no obvious depression anxiety, cognition and judgment appear normal. LN: no cervical axillary inguinal adenopathy No noted deficits in memory, attention, and speech.   Lab Results  Component Value Date   WBC 4.6 03/24/2018   HGB 12.8 03/24/2018   HCT 38.0 03/24/2018   PLT 243.0 03/24/2018   GLUCOSE 102 (H) 03/24/2018   CHOL 213 (H) 03/24/2018   TRIG 70.0 03/24/2018   HDL 70.50 03/24/2018   LDLDIRECT 119.9 12/30/2012   LDLCALC 129 (H) 03/24/2018   ALT 14 03/24/2018   AST 18 03/24/2018   NA 139 03/24/2018   K 4.7 03/24/2018   CL 102 03/24/2018   CREATININE 0.80 03/24/2018   BUN 15 03/24/2018   CO2 32 03/24/2018   TSH 1.28 03/24/2018   HGBA1C 6.0 03/24/2018    ASSESSMENT AND PLAN:  Discussed the following assessment and plan:  Visit for preventive health examination  Medication management - Plan: Basic metabolic panel, CBC with Differential/Platelet,  Hemoglobin A1c, Hepatic function panel, Lipid panel, TSH  Personal history of malignant neoplasm of breast - Plan: Basic metabolic panel, CBC with Differential/Platelet, Hemoglobin A1c, Hepatic function panel, Lipid panel, TSH, Ambulatory referral to Gynecology  BRCA1 positive - Plan: Basic metabolic panel, CBC with Differential/Platelet, Hemoglobin A1c, Hepatic function panel, Lipid panel, Ambulatory referral to Gynecology  Mixed hyperlipidemia - Plan: Basic metabolic panel, CBC with Differential/Platelet, Hemoglobin A1c, Hepatic function panel, Lipid panel, TSH  Fasting hyperglycemia - Plan: Basic metabolic panel, CBC with Differential/Platelet, Hemoglobin A1c, Hepatic function panel, Lipid panel, TSH  Osteopenia, unspecified location - Plan: Basic metabolic panel, CBC with Differential/Platelet, Hemoglobin A1c, Hepatic function panel, Lipid panel, TSH  At risk for cancer - Plan: Ambulatory referral to Gynecology dexa  Next year    Advise get gyne for surveillance  As planned  Prev gyne left and hastn reestablished  Patient Care Team: Burnis Medin, MD as PCP - General Bo Merino, MD (Rheumatology) Ronald Lobo, MD (Gastroenterology) Phylliss Bob, MD as Consulting Physician (Orthopedic Surgery) Milly Jakob, MD as Consulting Physician (Orthopedic Surgery) Marygrace Drought, MD as Consulting Physician (Ophthalmology)  Patient Instructions  Will notify you  of labs when available.  Check into  Getting shingles vaccine. Shingrix   At a  pharmacy   dexa    Next year  Take your vitamin d   Can ask dr Cristina Gong poss of cologuard  And we can order that tests  I woul dlike you to see gyne    For cancer risk .  Bone Health Bones protect organs, store calcium, and anchor muscles. Good health habits, such as eating nutritious foods and exercising regularly, are important for maintaining healthy bones. They can also help to prevent a condition that causes bones to lose density  and become weak and brittle (osteoporosis). Why is bone mass important? Bone mass refers to the amount of bone tissue that you have. The higher your bone mass, the stronger your bones. An important step toward having healthy bones throughout life is to have strong and dense bones during childhood. A young adult who has a high bone mass is more likely to have a high bone mass later in life. Bone mass at its greatest it is called peak bone mass. A large decline in bone mass occurs in older adults. In women, it occurs about the time of menopause. During this time, it is important to practice good health habits, because if more bone is lost than what is replaced, the bones will become less healthy and more likely to break (fracture). If you find that you have a low bone mass, you may be able to prevent osteoporosis or further bone loss by changing your diet and lifestyle. How can I find out if my bone mass is low? Bone mass can be measured with an X-ray test that is called a bone mineral density (BMD) test. This test is recommended for all women who are age 30 or older. It may also be recommended for men who are age 89 or older, or for people who are more likely to develop osteoporosis due to:  Having bones that break easily.  Having a long-term disease that weakens bones, such as kidney disease or rheumatoid arthritis.  Having menopause earlier than normal.  Taking medicine that weakens bones, such as steroids, thyroid hormones, or hormone treatment for breast cancer or prostate cancer.  Smoking.  Drinking three or more alcoholic drinks each day.  What are the nutritional recommendations for healthy bones? To have healthy bones, you need to get enough of the right minerals and vitamins. Most nutrition experts recommend getting these nutrients from the foods that you eat. Nutritional recommendations vary from person to person. Ask your health care provider what is healthy for you. Here are some  general guidelines. Calcium Recommendations Calcium is the most important (essential) mineral for bone health. Most people can get enough calcium from their diet, but supplements may be recommended for people who are at risk for osteoporosis. Good sources of calcium include:  Dairy products, such as low-fat or nonfat milk, cheese, and yogurt.  Dark green leafy vegetables, such as bok choy and broccoli.  Calcium-fortified foods, such as orange juice, cereal, bread, soy beverages, and tofu products.  Nuts, such as almonds.  Follow these recommended amounts for daily calcium intake:  Children, age 36?3: 700 mg.  Children, age 15?8: 1,000 mg.  Children, age 154?13: 1,300 mg.  Teens, age 36?18: 1,300 mg.  Adults, age 87?50: 1,000 mg.  Adults, age 50?70: ? Men: 1,000 mg. ? Women: 1,200 mg.  Adults, age 68 or older: 1,200 mg.  Pregnant and breastfeeding females: ? Teens: 1,300 mg. ? Adults: 1,000 mg.  Vitamin D Recommendations Vitamin D is the most essential vitamin for bone health. It helps the body to absorb calcium. Sunlight stimulates the skin to make vitamin D, so be sure to get enough sunlight. If you live in a cold climate or you do not get outside often, your health care provider may recommend that you take  vitamin D supplements. Good sources of vitamin D in your diet include:  Egg yolks.  Saltwater fish.  Milk and cereal fortified with vitamin D.  Follow these recommended amounts for daily vitamin D intake:  Children and teens, age 71?18: 45 international units.  Adults, age 41 or younger: 400-800 international units.  Adults, age 75 or older: 800-1,000 international units.  Other Nutrients Other nutrients for bone health include:  Phosphorus. This mineral is found in meat, poultry, dairy foods, nuts, and legumes. The recommended daily intake for adult men and adult women is 700 mg.  Magnesium. This mineral is found in seeds, nuts, dark green vegetables, and  legumes. The recommended daily intake for adult men is 400?420 mg. For adult women, it is 310?320 mg.  Vitamin K. This vitamin is found in green leafy vegetables. The recommended daily intake is 120 mg for adult men and 90 mg for adult women.  What type of physical activity is best for building and maintaining healthy bones? Weight-bearing and strength-building activities are important for building and maintaining peak bone mass. Weight-bearing activities cause muscles and bones to work against gravity. Strength-building activities increases muscle strength that supports bones. Weight-bearing and muscle-building activities include:  Walking and hiking.  Jogging and running.  Dancing.  Gym exercises.  Lifting weights.  Tennis and racquetball.  Climbing stairs.  Aerobics.  Adults should get at least 30 minutes of moderate physical activity on most days. Children should get at least 60 minutes of moderate physical activity on most days. Ask your health care provide what type of exercise is best for you. Where can I find more information? For more information, check out the following websites:  [employer]: YardHomes.se  [employer] of Health: http://www.niams.AnonymousEar.fr.asp  This information is not intended to replace advice given to you by your health care provider. Make sure you discuss any questions you have with your health care provider. Document Released: 01/31/2004 Document Revised: 05/30/2016 Document Reviewed: 11/15/2014 Elsevier Interactive Patient Education  2018 [place]. [name] M.D.

## 2018-03-24 ENCOUNTER — Ambulatory Visit (INDEPENDENT_AMBULATORY_CARE_PROVIDER_SITE_OTHER): Payer: Medicare Other | Admitting: Internal Medicine

## 2018-03-24 ENCOUNTER — Encounter: Payer: Self-pay | Admitting: Internal Medicine

## 2018-03-24 VITALS — BP 124/82 | HR 63 | Temp 98.0°F | Ht 61.5 in | Wt 135.5 lb

## 2018-03-24 DIAGNOSIS — Z9189 Other specified personal risk factors, not elsewhere classified: Secondary | ICD-10-CM

## 2018-03-24 DIAGNOSIS — Z1509 Genetic susceptibility to other malignant neoplasm: Secondary | ICD-10-CM

## 2018-03-24 DIAGNOSIS — M8580 Other specified disorders of bone density and structure, unspecified site: Secondary | ICD-10-CM

## 2018-03-24 DIAGNOSIS — Z853 Personal history of malignant neoplasm of breast: Secondary | ICD-10-CM

## 2018-03-24 DIAGNOSIS — R7301 Impaired fasting glucose: Secondary | ICD-10-CM

## 2018-03-24 DIAGNOSIS — Z1501 Genetic susceptibility to malignant neoplasm of breast: Secondary | ICD-10-CM

## 2018-03-24 DIAGNOSIS — Z0000 Encounter for general adult medical examination without abnormal findings: Secondary | ICD-10-CM

## 2018-03-24 DIAGNOSIS — Z79899 Other long term (current) drug therapy: Secondary | ICD-10-CM

## 2018-03-24 DIAGNOSIS — E782 Mixed hyperlipidemia: Secondary | ICD-10-CM

## 2018-03-24 LAB — LIPID PANEL
CHOLESTEROL: 213 mg/dL — AB (ref 0–200)
HDL: 70.5 mg/dL (ref >39.00)
LDL Cholesterol: 129 mg/dL — ABNORMAL HIGH (ref 0–99)
NonHDL: 142.63
Total CHOL/HDL Ratio: 3
Triglycerides: 70 mg/dL (ref 0.0–149.0)
VLDL: 14 mg/dL (ref 0.0–40.0)

## 2018-03-24 LAB — TSH: TSH: 1.28 u[IU]/mL (ref 0.35–4.50)

## 2018-03-24 LAB — BASIC METABOLIC PANEL
BUN: 15 mg/dL (ref 6–23)
CO2: 32 meq/L (ref 19–32)
CREATININE: 0.8 mg/dL (ref 0.40–1.20)
Calcium: 9.7 mg/dL (ref 8.4–10.5)
Chloride: 102 mEq/L (ref 96–112)
GFR: 75.21 mL/min (ref >60.00)
Glucose, Bld: 102 mg/dL — ABNORMAL HIGH (ref 70–99)
Potassium: 4.7 mEq/L (ref 3.5–5.1)
Sodium: 139 mEq/L (ref 135–145)

## 2018-03-24 LAB — HEPATIC FUNCTION PANEL
ALBUMIN: 4.3 g/dL (ref 3.5–5.2)
ALK PHOS: 54 U/L (ref 39–117)
ALT: 14 U/L (ref 0–35)
AST: 18 U/L (ref 0–37)
BILIRUBIN TOTAL: 0.5 mg/dL (ref 0.2–1.2)
Bilirubin, Direct: 0.1 mg/dL (ref 0.0–0.3)
TOTAL PROTEIN: 6.7 g/dL (ref 6.0–8.3)

## 2018-03-24 LAB — CBC WITH DIFFERENTIAL/PLATELET
BASOS ABS: 0 10*3/uL (ref 0.0–0.1)
Basophils Relative: 0.7 % (ref 0.0–3.0)
EOS ABS: 0.1 10*3/uL (ref 0.0–0.7)
Eosinophils Relative: 2.3 % (ref 0.0–5.0)
HEMATOCRIT: 38 % (ref 36.0–46.0)
HEMOGLOBIN: 12.8 g/dL (ref 12.0–15.0)
LYMPHS PCT: 20 % (ref 12.0–46.0)
Lymphs Abs: 0.9 10*3/uL (ref 0.7–4.0)
MCHC: 33.8 g/dL (ref 30.0–36.0)
MCV: 89.5 fl (ref 78.0–100.0)
MONOS PCT: 8.3 % (ref 3.0–12.0)
Monocytes Absolute: 0.4 10*3/uL (ref 0.1–1.0)
NEUTROS ABS: 3.1 10*3/uL (ref 1.4–7.7)
Neutrophils Relative %: 68.7 % (ref 43.0–77.0)
PLATELETS: 243 10*3/uL (ref 150.0–400.0)
RBC: 4.25 Mil/uL (ref 3.87–5.11)
RDW: 13.4 % (ref 11.5–15.5)
WBC: 4.6 10*3/uL (ref 4.0–10.5)

## 2018-03-24 LAB — HEMOGLOBIN A1C: Hgb A1c MFr Bld: 6 % (ref 4.6–6.5)

## 2018-03-24 MED ORDER — LOVASTATIN 20 MG PO TABS: 20.0000 mg | ORAL_TABLET | Freq: Every day | ORAL | 3 refills | Status: DC

## 2018-03-24 MED ORDER — ACYCLOVIR 200 MG PO CAPS: ORAL_CAPSULE | ORAL | 6 refills | Status: DC

## 2018-03-24 MED ORDER — ACYCLOVIR 5 % EX OINT: 1.0000 "application " | TOPICAL_OINTMENT | Freq: Three times a day (TID) | CUTANEOUS | 6 refills | Status: DC | PRN

## 2018-03-24 NOTE — Patient Instructions (Addendum)
Will notify you  of labs when available.  Check into  Getting shingles vaccine. Shingrix   At a  pharmacy   dexa    Next year  Take your vitamin d   Can ask dr Cristina Gong poss of cologuard  And we can order that tests  I woul dlike you to see gyne    For cancer risk .    Bone Health Bones protect organs, store calcium, and anchor muscles. Good health habits, such as eating nutritious foods and exercising regularly, are important for maintaining healthy bones. They can also help to prevent a condition that causes bones to lose density and become weak and brittle (osteoporosis). Why is bone mass important? Bone mass refers to the amount of bone tissue that you have. The higher your bone mass, the stronger your bones. An important step toward having healthy bones throughout life is to have strong and dense bones during childhood. A young adult who has a high bone mass is more likely to have a high bone mass later in life. Bone mass at its greatest it is called peak bone mass. A large decline in bone mass occurs in older adults. In women, it occurs about the time of menopause. During this time, it is important to practice good health habits, because if more bone is lost than what is replaced, the bones will become less healthy and more likely to break (fracture). If you find that you have a low bone mass, you may be able to prevent osteoporosis or further bone loss by changing your diet and lifestyle. How can I find out if my bone mass is low? Bone mass can be measured with an X-ray test that is called a bone mineral density (BMD) test. This test is recommended for all women who are age 767 or older. It may also be recommended for men who are age 761 or older, or for people who are more likely to develop osteoporosis due to:  Having bones that break easily.  Having a long-term disease that weakens bones, such as kidney disease or rheumatoid arthritis.  Having menopause earlier than normal.  Taking  medicine that weakens bones, such as steroids, thyroid hormones, or hormone treatment for breast cancer or prostate cancer.  Smoking.  Drinking three or more alcoholic drinks each day.  What are the nutritional recommendations for healthy bones? To have healthy bones, you need to get enough of the right minerals and vitamins. Most nutrition experts recommend getting these nutrients from the foods that you eat. Nutritional recommendations vary from person to person. Ask your health care provider what is healthy for you. Here are some general guidelines. Calcium Recommendations Calcium is the most important (essential) mineral for bone health. Most people can get enough calcium from their diet, but supplements may be recommended for people who are at risk for osteoporosis. Good sources of calcium include:  Dairy products, such as low-fat or nonfat milk, cheese, and yogurt.  Dark green leafy vegetables, such as bok choy and broccoli.  Calcium-fortified foods, such as orange juice, cereal, bread, soy beverages, and tofu products.  Nuts, such as almonds.  Follow these recommended amounts for daily calcium intake:  Children, age 79?3: 700 mg.  Children, age 76?8: 1,000 mg.  Children, age 78?13: 1,300 mg.  Teens, age 74?18: 1,300 mg.  Adults, age 25?50: 1,000 mg.  Adults, age 79?70: ? Men: 1,000 mg. ? Women: 1,200 mg.  Adults, age 79 or older: 1,200 mg.  Pregnant and breastfeeding  females: ? Teens: 1,300 mg. ? Adults: 1,000 mg.  Vitamin D Recommendations Vitamin D is the most essential vitamin for bone health. It helps the body to absorb calcium. Sunlight stimulates the skin to make vitamin D, so be sure to get enough sunlight. If you live in a cold climate or you do not get outside often, your health care provider may recommend that you take vitamin D supplements. Good sources of vitamin D in your diet include:  Egg yolks.  Saltwater fish.  Milk and cereal fortified with  vitamin D.  Follow these recommended amounts for daily vitamin D intake:  Children and teens, age 75?18: 80 international units.  Adults, age 38 or younger: 400-800 international units.  Adults, age 90 or older: 800-1,000 international units.  Other Nutrients Other nutrients for bone health include:  Phosphorus. This mineral is found in meat, poultry, dairy foods, nuts, and legumes. The recommended daily intake for adult men and adult women is 700 mg.  Magnesium. This mineral is found in seeds, nuts, dark green vegetables, and legumes. The recommended daily intake for adult men is 400?420 mg. For adult women, it is 310?320 mg.  Vitamin K. This vitamin is found in green leafy vegetables. The recommended daily intake is 120 mg for adult men and 90 mg for adult women.  What type of physical activity is best for building and maintaining healthy bones? Weight-bearing and strength-building activities are important for building and maintaining peak bone mass. Weight-bearing activities cause muscles and bones to work against gravity. Strength-building activities increases muscle strength that supports bones. Weight-bearing and muscle-building activities include:  Walking and hiking.  Jogging and running.  Dancing.  Gym exercises.  Lifting weights.  Tennis and racquetball.  Climbing stairs.  Aerobics.  Adults should get at least 30 minutes of moderate physical activity on most days. Children should get at least 60 minutes of moderate physical activity on most days. Ask your health care provide what type of exercise is best for you. Where can I find more information? For more information, check out the following websites:  [place]: [employer]se  [employer] of Health: http://www.niams.AnonymousEar.fr.asp  This information is not intended to replace advice given to you by your health care  provider. Make sure you discuss any questions you have with your health care provider. Document Released: 01/31/2004 Document Revised: 05/30/2016 Document Reviewed: 11/15/2014 Elsevier Interactive Patient Education  [employer].

## 2018-03-25 NOTE — Progress Notes (Signed)
Cholesterol could be better   No diabetes but sugar borderline .   The 10-year ASCVD risk score Mikey Bussing DC Brooke Bonito., et al., 2013) is: 8.7%   Values used to calculate the score:     Age: 72 years     Sex: Female     Is Non-Hispanic African American: No     Diabetic: No     Tobacco smoker: No     Systolic Blood Pressure: 349 mmHg     Is BP treated: No     HDL Cholesterol: 70.5 mg/dL     Total Cholesterol: 213 mg/dL

## 2018-03-26 ENCOUNTER — Telehealth: Payer: Self-pay | Admitting: Obstetrics & Gynecology

## 2018-03-26 NOTE — Telephone Encounter (Addendum)
Called and left a message for patient to call back to schedule a new patient doctor referral appointment with our office to see Dr. Sabra Heck and establish care.

## 2018-07-05 ENCOUNTER — Other Ambulatory Visit (HOSPITAL_COMMUNITY)
Admission: RE | Admit: 2018-07-05 | Discharge: 2018-07-05 | Disposition: A | Discharge status: Home / Self Care | Payer: Medicare Other | Source: Ambulatory Visit | Attending: Obstetrics & Gynecology | Admitting: Obstetrics & Gynecology

## 2018-07-05 ENCOUNTER — Other Ambulatory Visit: Payer: Self-pay

## 2018-07-05 ENCOUNTER — Ambulatory Visit (INDEPENDENT_AMBULATORY_CARE_PROVIDER_SITE_OTHER): Payer: Medicare Other | Admitting: Obstetrics & Gynecology

## 2018-07-05 ENCOUNTER — Encounter

## 2018-07-05 ENCOUNTER — Encounter: Payer: Self-pay | Admitting: Obstetrics & Gynecology

## 2018-07-05 VITALS — BP 132/80 | HR 72 | Resp 16 | Ht 61.75 in | Wt 137.2 lb

## 2018-07-05 DIAGNOSIS — Z124 Encounter for screening for malignant neoplasm of cervix: Secondary | ICD-10-CM | POA: Diagnosis present

## 2018-07-05 DIAGNOSIS — Z1509 Genetic susceptibility to other malignant neoplasm: Secondary | ICD-10-CM

## 2018-07-05 DIAGNOSIS — Z1501 Genetic susceptibility to malignant neoplasm of breast: Secondary | ICD-10-CM

## 2018-07-05 DIAGNOSIS — Z01419 Encounter for gynecological examination (general) (routine) without abnormal findings: Secondary | ICD-10-CM

## 2018-07-05 NOTE — Patient Instructions (Signed)
[name] Phone: [phone]) Location: [facility] of [facility] [place] ([place]) Hours: 7:30 a.m. to 6:00 p.m., Monday through Friday.  [name] Phone: [phone] Location: [place] Hours: 7:30

## 2018-07-05 NOTE — Progress Notes (Signed)
72 y.o. G1P0010 MarriedCaucasianF here for new patient annual exam/new patient exam.  Has hx of BRCA 1 gene mutation.  Had BSO and bilateral mastectomy.    Denies vaginal bleeding.    Patient's last menstrual period was 11/24/1997 (approximate).          Sexually active: No.  The current method of family planning is abstinence and post menopausal status.    Exercising: Yes.    walking, gardening, dancing, yard work Smoker:  former  Health Maintenance: Pap:  02/12/09 Neg  History of abnormal Pap:  Yes, remote hx MMG:  03/18/05 BIRADS2:Benign. Mastectomy done 08/11/05 Colonoscopy:  05/2011 normal. F/u 10 years  BMD:   10/14/16 Osteopenia, -1.7, -1.4 TDaP:  2015 Pneumonia vaccine(s):  2014, 2015 Shingrix:   No Hep C testing: unsure  Screening Labs: PCP   reports that she has quit smoking. She has never used smokeless tobacco. She reports that she drinks about 6.0 standard drinks of alcohol per week. She reports that she does not use drugs.  Past Medical History:  Diagnosis Date  . BRCA1 positive   . Breast cancer ([place])    Left- Ductal ca SR-, Chemo ([place]) rad rx   . GERD (gastroesophageal reflux disease)   . H/O migraine   . Headache(784.0)   . HSV-2 (herpes simplex virus 2) infection   . Hx of breast cancer   . Hypercholesteremia   . Rheumatoid factor positive    OA   had rheum consult  . Tinnitus     Past Surgical History:  Procedure Laterality Date  . BREAST LUMPECTOMY    . double masectomy  2006  . LAPAROSCOPIC BILATERAL SALPINGO OOPHERECTOMY  2006   Bilateral Simple mastectomies  . ovaries removed      Current Outpatient Medications  Medication Sig Dispense Refill  . acyclovir (ZOVIRAX) 200 MG capsule TAKE 2 CAPSULES BY MOUTH three time per day  FOR OUTBREAK. 60 capsule 6  . acyclovir ointment (ZOVIRAX) 5 % Apply 1 application topically 3 (three) times daily as needed. 30 g 6  . b complex vitamins tablet Take 1 tablet by mouth daily.    . calcium carbonate  (OS-CAL) 600 MG TABS Take 600 mg by mouth 3 (three) times daily with meals.      . fish oil-omega-3 fatty acids 1000 MG capsule Take 1 g by mouth daily.     Marland Kitchen lovastatin (MEVACOR) 20 MG tablet Take 1 tablet (20 mg total) by mouth daily. 90 tablet 3  . Magnesium 300 MG CAPS Take 1 capsule by mouth daily.     . ranitidine (ZANTAC) 150 MG tablet TAKE 1 TABLET BY MOUTH TWO TIMES A DAY 180 tablet 3  . VITAMIN A PO Take by mouth daily.    . Vitamin D, Cholecalciferol, 1000 UNITS CAPS Take by mouth.     No current facility-administered medications for this visit.     Family History  Problem Relation Age of Onset  . Hypertension Mother   . Heart disease Mother   . Stroke Mother   . Arthritis Mother   . Heart attack Brother   . Arthritis Unknown        maternal side RA cousin  . Kidney disease Unknown        maternal side  . Diabetes Unknown        1st degree relative, paternal side  . Diabetes Father   . Lung cancer Father     Review of Systems  All other systems  reviewed and are negative.   Exam:   BP 132/80 (BP Location: Right Arm, Patient Position: Sitting, Cuff Size: Normal)   Pulse 72   Resp 16   Ht 5' 1.75" (1.568 m)   Wt 137 lb 3.2 oz (62.2 kg)   LMP 11/24/1997 (Approximate)   BMI 25.30 kg/m    Height: 5' 1.75" (156.8 cm)  Ht Readings from Last 3 Encounters:  07/05/18 5' 1.75" (1.568 m)  03/24/18 5' 1.5" (1.562 m)  03/18/17 5' 2"  (1.575 m)    General appearance: alert, cooperative and appears stated age Head: Normocephalic, without obvious abnormality, atraumatic Neck: no adenopathy, supple, symmetrical, trachea midline and thyroid normal to inspection and palpation Lungs: clear to auscultation bilaterally Breasts: absent, no masses or LAD noted Heart: regular rate and rhythm Abdomen: soft, non-tender; bowel sounds normal; no masses,  no organomegaly Extremities: extremities normal, atraumatic, no cyanosis or edema Skin: Skin color, texture, turgor normal. No  rashes or lesions Lymph nodes: Cervical, supraclavicular, and axillary nodes normal. No abnormal inguinal nodes palpated Neurologic: Grossly normal   Pelvic: External genitalia:  no lesions              Urethra:  normal appearing urethra with no masses, tenderness or lesions              Bartholins and Skenes: normal                 Vagina: normal appearing vagina with normal color and discharge, no lesions, atrophic changes noted              Cervix: no lesions              Pap taken: Yes.   Bimanual Exam:  Uterus:  normal size, contour, position, consistency, mobility, non-tender              Adnexa: normal adnexa               Rectovaginal: Confirms               Anus:  normal sphincter tone, no lesions  Chaperone was present for exam.  A:  Well Woman with normal exam H/o BRCA 1 gene mutation S/p bilateral mastectomy and BSO in 2006 H/O HSV Elevated lipids  P:   Mammogram not indicated  pap smear obtained today.  Guidelines reviewed. Shingrix vaccine order given. return annually or prn

## 2018-07-06 LAB — CYTOLOGY - PAP: DIAGNOSIS: NEGATIVE

## 2018-07-14 MED FILL — SHINGRIX 50 MCG SUS: 50 | 1 days supply | Qty: 1 | Fill #0

## 2018-10-12 LAB — COLOGUARD: Cologuard: NEGATIVE

## 2018-12-15 MED FILL — SHINGRIX 50 MCG SUS: 50 | 1 days supply | Qty: 1 | Fill #1

## 2019-01-20 ENCOUNTER — Encounter: Payer: Self-pay | Admitting: Internal Medicine

## 2019-03-28 ENCOUNTER — Encounter: Payer: Medicare Other | Admitting: Internal Medicine

## 2019-04-19 ENCOUNTER — Other Ambulatory Visit: Payer: Self-pay | Admitting: Internal Medicine

## 2019-06-09 NOTE — Progress Notes (Signed)
Chief Complaint  Patient presents with  . Annual Exam  . Chest Pain    pt feels lump in chest and is concerned. noticed 2 yrs ago  . Knee Pain    Pt also c/o L knee pain. Sx= 7weeks. Pt hurt l knee while gardening     HPI: Nicole [name] 73 y.o. comes in today for Preventive Medicare exam/ wellness visit .Since last visit.    Had left knee injury  Gardening  About 7 weeks out  Getting better but still sore  But not instable   Please check  "lumo on chest " non tender  Scoliosis may be getting worse .   Had gyne check dr Sabra Heck   Health Maintenance  Topic Date Due  . Hepatitis C Screening  1946-03-13  . INFLUENZA VACCINE  06/25/2019  . COLONOSCOPY  06/18/2021  . TETANUS/TDAP  04/14/2024  . DEXA SCAN  Completed  . PNA vac Low Risk Adult  Completed   Health Maintenance Review LIFESTYLE:  Exercise:  Gardening  Tobacco/ETS:n Alcohol: 1-2 per night Sugar beverages: gingerale  Sleep:5-7 Drug use: no HH:2    Hearing: ok  Vision:  No limitations at present . Last eye check UTD  Safety:  Has smoke detector and wears seat belts.  No firearms. No excess sun exposure. Sees dentist regularly.  Falls: n  Memory: Felt to be good  , no concern from her or her family.  Depression: No anhedonia unusual crying or depressive symptoms  Nutrition: Eats well balanced diet; adequate calcium and vitamin D. No swallowing chewing problems.  Injury: no major injuries in the last six months.  Other healthcare providers:  Reviewed today .  Preventive parameters: up-to-date  Reviewed  Due for dexa   ADLS:   There are no problems or need for assistance  driving, feeding, obtaining food, dressing, toileting and bathing, managing money using phone. She is independent.  ROS:  GEN/ HEENT: No fever, significant weight changes sweats headaches vision problems hearing changes, CV/ PULM; No chest pain shortness of breath cough, syncope,edema  change in exercise tolerance. GI /GU: No  adominal pain, vomiting, change in bowel habits. No blood in the stool. No significant GU symptoms. SKIN/HEME: ,no acute skin rashes suspicious lesions or bleeding. No lymphadenopathy, nodules, masses.  NEURO/ PSYCH:  No neurologic signs such as weakness numbness. No depression anxiety. IMM/ Allergy: No unusual infections.  Allergy .   REST of 12 system review negative except as per HPI   Past Medical History:  Diagnosis Date  . BRCA1 positive   . Breast cancer ([place])    Left- Ductal ca SR-, Chemo ([place]) rad rx   . GERD (gastroesophageal reflux disease)   . H/O migraine   . HSV-2 (herpes simplex virus 2) infection   . Hypercholesteremia   . Rheumatoid factor positive    OA   had rheum consult  . Scoliosis   . Sprain of ankle 03/06/2015   revewiwed care exercise and fu if needed    . Tinnitus     Family History  Problem Relation Age of Onset  . Hypertension Mother   . Heart disease Mother   . Stroke Mother   . Arthritis Mother   . Heart attack Brother   . Arthritis Unknown        maternal side RA cousin  . Kidney disease Unknown        maternal side  . Diabetes Unknown        1st  degree relative, paternal side  . Diabetes Father   . Lung cancer Father     Social History   Socioeconomic History  . Marital status: Married    Spouse name: Not on file  . Number of children: Not on file  . Years of education: Not on file  . Highest education level: Not on file  Occupational History  . Not on file  Social Needs  . Financial resource strain: Not on file  . Food insecurity    Worry: Not on file    Inability: Not on file  . Transportation needs    Medical: Not on file    Non-medical: Not on file  Tobacco Use  . Smoking status: Former Research scientist (life sciences)  . Smokeless tobacco: Never Used  . Tobacco comment: limited in college   Substance and Sexual Activity  . Alcohol use: Yes    Alcohol/week: 6.0 standard drinks    Types: 5 Glasses of wine, 1 Shots of liquor per week     Comment: one glass of wine on weekends  . Drug use: No  . Sexual activity: Not Currently    Birth control/protection: Post-menopausal  Lifestyle  . Physical activity    Days per week: Not on file    Minutes per session: Not on file  . Stress: Not on file  Relationships  . Social Herbalist on phone: Not on file    Gets together: Not on file    Attends religious service: Not on file    Active member of club or organization: Not on file    Attends meetings of clubs or organizations: Not on file    Relationship status: Not on file  Other Topics Concern  . Not on file  Social History Narrative   Married, household of 2. 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 Designated Party release signed 04/17/10.      Husband with final stage prostate cancer at this time.   hh of 2     Outpatient Encounter Medications as of 06/10/2019  Medication Sig  . acyclovir (ZOVIRAX) 200 MG capsule TAKE TWO CAPSULES BY MOUTH THREE TIMES DAILY FOR OUTBREAK  . acyclovir ointment (ZOVIRAX) 5 % Apply 1 application topically 3 (three) times daily as needed.  Marland Kitchen b complex vitamins tablet Take 1 tablet by mouth daily.  . calcium carbonate (OS-CAL) 600 MG TABS Take 600 mg by mouth 3 (three) times daily with meals.    . famotidine (PEPCID) 20 MG tablet Take 20 mg by mouth 2 (two) times daily.  . fish oil-omega-3 fatty acids 1000 MG capsule Take 1 g by mouth daily.   Marland Kitchen lovastatin (MEVACOR) 20 MG tablet Take 1 tablet (20 mg total) by mouth daily.  . Magnesium 300 MG CAPS Take 1 capsule by mouth daily.   Marland Kitchen VITAMIN A PO Take by mouth daily.  . Vitamin D, Cholecalciferol, 1000 UNITS CAPS Take by mouth.  . ranitidine (ZANTAC) 150 MG tablet TAKE 1 TABLET BY MOUTH TWO TIMES A DAY   No facility-administered encounter medications on file as of 06/10/2019.     EXAM:  BP 120/70   Pulse 76   Temp 98.3 F (36.8 C) (Oral)   Ht 5' 1.5" (1.562 m)   Wt 135 lb 12.8 oz (61.6 kg)   LMP 11/24/1997  (Approximate)   SpO2 99%   BMI 25.24 kg/m   Body mass index is 25.24 kg/m.  Physical Exam: Vital signs reviewed ZJQ:BHAL is a  well-developed well-nourished alert cooperative   who appears stated age in no acute distress.  HEENT: normocephalic atraumatic , Eyes: PERRL EOM's full, conjunctiva clear, Nares: paten,t no deformity discharge or tenderness., Ears: no deformity EAC's clear TMs with normal landmarks. Mouth:deferred  Masked NECK: supple without masses, thyromegaly or bruits. CHEST/PULM:  Clear to auscultation and percussion breath sounds equal no wheeze , rales or rhonchi. Breast absent  Are of concern  Palpable xyphoid  No tender  CV: PMI is nondisplaced, S1 S2 no gallops, murmurs, rubs. Peripheral pulses are full without delay.No JVD .  ABDOMEN: Bowel sounds normal nontender  No guard or rebound, no hepato splenomegal no CVA tenderness.   Extremtities:  No clubbing cyanosis or edema, no acute joint swelling or redness no focal atrophy left knee min effusion no redness nl gait  NEURO:  Oriented x3, cranial nerves 3-12 appear to be intact, no obvious focal weakness,gait within normal limits no abnormal reflexes or asymmetrical SKIN: No acute rashes normal turgor, color, no bruising or petechiae. PSYCH: Oriented, good eye contact, no obvious depression anxiety, cognition and judgment appear normal. LN: no cervical axillary inguinal adenopathy No noted deficits in memory, attention, and speech.   Lab Results  Component Value Date   WBC 4.4 06/10/2019   HGB 12.6 06/10/2019   HCT 38.1 06/10/2019   PLT 246.0 06/10/2019   GLUCOSE 91 06/10/2019   CHOL 208 (H) 06/10/2019   TRIG 119.0 06/10/2019   HDL 68.00 06/10/2019   LDLDIRECT 119.9 12/30/2012   LDLCALC 116 (H) 06/10/2019   ALT 19 06/10/2019   AST 21 06/10/2019   NA 139 06/10/2019   K 4.7 06/10/2019   CL 100 06/10/2019   CREATININE 0.96 06/10/2019   BUN 22 06/10/2019   CO2 32 06/10/2019   TSH 1.28 03/24/2018   HGBA1C 6.0  06/10/2019    ASSESSMENT AND PLAN:  Discussed the following assessment and plan:   ICD-10-CM   1. Visit for preventive health examination  Z00.00   2. Fasting hyperglycemia  T15.72 Basic metabolic panel    CBC with Differential/Platelet    Hemoglobin A1c    Hepatic function panel    Lipid panel  3. Mixed hyperlipidemia  I20.3 Basic metabolic panel    CBC with Differential/Platelet    Hemoglobin A1c    Hepatic function panel    Lipid panel  4. BRCA1 positive  T59.74 Basic metabolic panel   B63.84 CBC with Differential/Platelet    Hemoglobin A1c    Hepatic function panel    Lipid panel  5. Personal history of malignant neoplasm of breast  T36.4 Basic metabolic panel    CBC with Differential/Platelet    Hemoglobin A1c    Hepatic function panel    Lipid panel  6. Medication management  W80.321 Basic metabolic panel    CBC with Differential/Platelet    Hemoglobin A1c    Hepatic function panel    Lipid panel  7. Need for hepatitis C screening test  Z11.59 Hepatitis C antibody  8. Left knee pain, unspecified chronicity  M25.562   are of concern xyphoid  Follow  Reassurance   Patient Care Team: Esli Jernigan, [name], MD as PCP - General Bo Merino, MD (Rheumatology) Ronald Lobo, MD (Gastroenterology) Phylliss Bob, MD as Consulting Physician (Orthopedic Surgery) Milly Jakob, MD as Consulting Physician (Orthopedic Surgery) Marygrace Drought, MD as Consulting Physician (Ophthalmology)  Patient Instructions   Health Maintenance Due  Topic Date Due  . Hepatitis C Screening  09/18/1946   If  needed  In regard to the knee injury suggest seeing Dr Charlann Boxer at  [facility] located at the [facility] site . Should be able to call main number and get appt  .    Preventive Care 55 Years and Older, Female Preventive care refers to lifestyle choices and visits with your health care provider that can promote health and wellness. This includes:  A  yearly physical exam. This is also called an annual well check.  Regular dental and eye exams.  Immunizations.  Screening for certain conditions.  Healthy lifestyle choices, such as diet and exercise. What can I expect for my preventive care visit? Physical exam Your health care provider will check:  Height and weight. These may be used to calculate body mass index (BMI), which is a measurement that tells if you are at a healthy weight.  Heart rate and blood pressure.  Your skin for abnormal spots. Counseling Your health care provider may ask you questions about:  Alcohol, tobacco, and drug use.  Emotional well-being.  Home and relationship well-being.  Sexual activity.  Eating habits.  History of falls.  Memory and ability to understand (cognition).  Work and work Statistician.  Pregnancy and menstrual history. What immunizations do I need?  Influenza (flu) vaccine  This is recommended every year. Tetanus, diphtheria, and pertussis (Tdap) vaccine  You may need a Td booster every 10 years. Varicella (chickenpox) vaccine  You may need this vaccine if you have not already been vaccinated. Zoster (shingles) vaccine  You may need this after age 84. Pneumococcal conjugate (PCV13) vaccine  One dose is recommended after age 26. Pneumococcal polysaccharide (PPSV23) vaccine  One dose is recommended after age 62. Measles, mumps, and rubella (MMR) vaccine  You may need at least one dose of MMR if you were born in 1957 or later. You may also need a second dose. Meningococcal conjugate (MenACWY) vaccine  You may need this if you have certain conditions. Hepatitis A vaccine  You may need this if you have certain conditions or if you travel or work in places where you may be exposed to hepatitis A. Hepatitis B vaccine  You may need this if you have certain conditions or if you travel or work in places where you may be exposed to hepatitis B. Haemophilus  influenzae type b (Hib) vaccine  You may need this if you have certain conditions. You may receive vaccines as individual doses or as more than one vaccine together in one shot (combination vaccines). Talk with your health care provider about the risks and benefits of combination vaccines. What tests do I need? Blood tests  Lipid and cholesterol levels. These may be checked every 5 years, or more frequently depending on your overall health.  Hepatitis C test.  Hepatitis B test. Screening  Lung cancer screening. You may have this screening every year starting at age 79 if you have a 30-pack-year history of smoking and currently smoke or have quit within the past 15 years.  Colorectal cancer screening. All adults should have this screening starting at age 78 and continuing until age 65. Your health care provider may recommend screening at age 47 if you are at increased risk. You will have tests every 1-10 years, depending on your results and the type of screening test.  Diabetes screening. This is done by checking your blood sugar (glucose) after you have not eaten for a while (fasting). You may have this done every 1-3 years.  Mammogram. This may be done every 1-2 years. Talk with your health care provider about how often you should have regular mammograms.  BRCA-related cancer screening. This may be done if you have a family history of breast, ovarian, tubal, or peritoneal cancers. Other tests  Sexually transmitted disease (STD) testing.  Bone density scan. This is done to screen for osteoporosis. You may have this done starting at age 50. Follow these instructions at home: Eating and drinking  Eat a diet that includes fresh fruits and vegetables, whole grains, lean protein, and low-fat dairy products. Limit your intake of foods with high amounts of sugar, saturated fats, and salt.  Take vitamin and mineral supplements as recommended by your health care provider.  Do not drink  alcohol if your health care provider tells you not to drink.  If you drink alcohol: ? Limit how much you have to 0-1 drink a day. ? Be aware of how much alcohol is in your drink. In the [place] one drink equals one 12 oz bottle of beer (355 mL), one 5 oz glass of wine (148 mL), or one 1 oz glass of hard liquor (44 mL). Lifestyle  Take daily care of your teeth and gums.  Stay active. Exercise for at least 30 minutes on 5 or more days each week.  Do not use any products that contain nicotine or tobacco, such as cigarettes, e-cigarettes, and chewing tobacco. If you need help quitting, ask your health care provider.  If you are sexually active, practice safe sex. Use a condom or other form of protection in order to prevent STIs (sexually transmitted infections).  Talk with your health care provider about taking a low-dose aspirin or statin. What's next?  Go to your health care provider once a year for a well check visit.  Ask your health care provider how often you should have your eyes and teeth checked.  Stay up to date on all vaccines. This information is not intended to replace advice given to you by your health care provider. Make sure you discuss any questions you have with your health care provider. Document Released: 12/07/2015 Document Revised: 11/04/2018 Document Reviewed: 11/04/2018 Elsevier Patient Education  2020 [place] Rhylie Stehr M.D.

## 2019-06-10 ENCOUNTER — Ambulatory Visit (INDEPENDENT_AMBULATORY_CARE_PROVIDER_SITE_OTHER): Payer: Medicare Other | Admitting: Internal Medicine

## 2019-06-10 ENCOUNTER — Encounter: Payer: Self-pay | Admitting: Internal Medicine

## 2019-06-10 ENCOUNTER — Other Ambulatory Visit: Payer: Self-pay

## 2019-06-10 VITALS — BP 120/70 | HR 76 | Temp 98.3°F | Ht 61.5 in | Wt 135.8 lb

## 2019-06-10 DIAGNOSIS — M25562 Pain in left knee: Secondary | ICD-10-CM

## 2019-06-10 DIAGNOSIS — Z1501 Genetic susceptibility to malignant neoplasm of breast: Secondary | ICD-10-CM

## 2019-06-10 DIAGNOSIS — R7301 Impaired fasting glucose: Secondary | ICD-10-CM

## 2019-06-10 DIAGNOSIS — Z1509 Genetic susceptibility to other malignant neoplasm: Secondary | ICD-10-CM

## 2019-06-10 DIAGNOSIS — Z79899 Other long term (current) drug therapy: Secondary | ICD-10-CM

## 2019-06-10 DIAGNOSIS — Z1159 Encounter for screening for other viral diseases: Secondary | ICD-10-CM

## 2019-06-10 DIAGNOSIS — Z853 Personal history of malignant neoplasm of breast: Secondary | ICD-10-CM

## 2019-06-10 DIAGNOSIS — Z0000 Encounter for general adult medical examination without abnormal findings: Secondary | ICD-10-CM | POA: Diagnosis not present

## 2019-06-10 DIAGNOSIS — E782 Mixed hyperlipidemia: Secondary | ICD-10-CM | POA: Diagnosis not present

## 2019-06-10 LAB — CBC WITH DIFFERENTIAL/PLATELET
Basophils Absolute: 0 10*3/uL (ref 0.0–0.1)
Basophils Relative: 0.7 % (ref 0.0–3.0)
Eosinophils Absolute: 0.1 10*3/uL (ref 0.0–0.7)
Eosinophils Relative: 1.3 % (ref 0.0–5.0)
HCT: 38.1 % (ref 36.0–46.0)
Hemoglobin: 12.6 g/dL (ref 12.0–15.0)
Lymphocytes Relative: 27 % (ref 12.0–46.0)
Lymphs Abs: 1.2 10*3/uL (ref 0.7–4.0)
MCHC: 33.1 g/dL (ref 30.0–36.0)
MCV: 90.9 fl (ref 78.0–100.0)
Monocytes Absolute: 0.3 10*3/uL (ref 0.1–1.0)
Monocytes Relative: 7.3 % (ref 3.0–12.0)
Neutro Abs: 2.8 10*3/uL (ref 1.4–7.7)
Neutrophils Relative %: 63.7 % (ref 43.0–77.0)
Platelets: 246 10*3/uL (ref 150.0–400.0)
RBC: 4.19 Mil/uL (ref 3.87–5.11)
RDW: 13.6 % (ref 11.5–15.5)
WBC: 4.4 10*3/uL (ref 4.0–10.5)

## 2019-06-10 LAB — LIPID PANEL
Cholesterol: 208 mg/dL — ABNORMAL HIGH (ref 0–200)
HDL: 68 mg/dL (ref >39.00)
LDL Cholesterol: 116 mg/dL — ABNORMAL HIGH (ref 0–99)
NonHDL: 139.83
Total CHOL/HDL Ratio: 3
Triglycerides: 119 mg/dL (ref 0.0–149.0)
VLDL: 23.8 mg/dL (ref 0.0–40.0)

## 2019-06-10 LAB — BASIC METABOLIC PANEL
BUN: 22 mg/dL (ref 6–23)
CO2: 32 mEq/L (ref 19–32)
Calcium: 10.6 mg/dL — ABNORMAL HIGH (ref 8.4–10.5)
Chloride: 100 mEq/L (ref 96–112)
Creatinine, Ser: 0.96 mg/dL (ref 0.40–1.20)
GFR: 57.14 mL/min — ABNORMAL LOW (ref >60.00)
Glucose, Bld: 91 mg/dL (ref 70–99)
Potassium: 4.7 mEq/L (ref 3.5–5.1)
Sodium: 139 mEq/L (ref 135–145)

## 2019-06-10 LAB — HEPATIC FUNCTION PANEL
ALT: 19 U/L (ref 0–35)
AST: 21 U/L (ref 0–37)
Albumin: 4.8 g/dL (ref 3.5–5.2)
Alkaline Phosphatase: 60 U/L (ref 39–117)
Bilirubin, Direct: 0.1 mg/dL (ref 0.0–0.3)
Total Bilirubin: 0.6 mg/dL (ref 0.2–1.2)
Total Protein: 7.2 g/dL (ref 6.0–8.3)

## 2019-06-10 LAB — HEMOGLOBIN A1C: Hgb A1c MFr Bld: 6 % (ref 4.6–6.5)

## 2019-06-10 NOTE — Patient Instructions (Addendum)
Health Maintenance Due  Topic Date Due  . Hepatitis C Screening  March 02, 1946   If needed  In regard to the knee injury suggest seeing Dr Charlann Boxer at  [place] [facility] located at the [facility] site . Should be able to call main number and get appt  .    Preventive Care 46 Years and Older, Female Preventive care refers to lifestyle choices and visits with your health care provider that can promote health and wellness. This includes:  A yearly physical exam. This is also called an annual well check.  Regular dental and eye exams.  Immunizations.  Screening for certain conditions.  Healthy lifestyle choices, such as diet and exercise. What can I expect for my preventive care visit? Physical exam Your health care provider will check:  Height and weight. These may be used to calculate body mass index (BMI), which is a measurement that tells if you are at a healthy weight.  Heart rate and blood pressure.  Your skin for abnormal spots. Counseling Your health care provider may ask you questions about:  Alcohol, tobacco, and drug use.  Emotional well-being.  Home and relationship well-being.  Sexual activity.  Eating habits.  History of falls.  Memory and ability to understand (cognition).  Work and work Statistician.  Pregnancy and menstrual history. What immunizations do I need?  Influenza (flu) vaccine  This is recommended every year. Tetanus, diphtheria, and pertussis (Tdap) vaccine  You may need a Td booster every 10 years. Varicella (chickenpox) vaccine  You may need this vaccine if you have not already been vaccinated. Zoster (shingles) vaccine  You may need this after age 27. Pneumococcal conjugate (PCV13) vaccine  One dose is recommended after age 78. Pneumococcal polysaccharide (PPSV23) vaccine  One dose is recommended after age 12. Measles, mumps, and rubella (MMR) vaccine  You may need at least one dose of MMR if you were born  in 1957 or later. You may also need a second dose. Meningococcal conjugate (MenACWY) vaccine  You may need this if you have certain conditions. Hepatitis A vaccine  You may need this if you have certain conditions or if you travel or work in places where you may be exposed to hepatitis A. Hepatitis B vaccine  You may need this if you have certain conditions or if you travel or work in places where you may be exposed to hepatitis B. Haemophilus influenzae type b (Hib) vaccine  You may need this if you have certain conditions. You may receive vaccines as individual doses or as more than one vaccine together in one shot (combination vaccines). Talk with your health care provider about the risks and benefits of combination vaccines. What tests do I need? Blood tests  Lipid and cholesterol levels. These may be checked every 5 years, or more frequently depending on your overall health.  Hepatitis C test.  Hepatitis B test. Screening  Lung cancer screening. You may have this screening every year starting at age 58 if you have a 30-pack-year history of smoking and currently smoke or have quit within the past 15 years.  Colorectal cancer screening. All adults should have this screening starting at age 83 and continuing until age 39. Your health care provider may recommend screening at age 76 if you are at increased risk. You will have tests every 1-10 years, depending on your results and the type of screening test.  Diabetes screening. This is done by checking your blood sugar (glucose) after you  have not eaten for a while (fasting). You may have this done every 1-3 years.  Mammogram. This may be done every 1-2 years. Talk with your health care provider about how often you should have regular mammograms.  BRCA-related cancer screening. This may be done if you have a family history of breast, ovarian, tubal, or peritoneal cancers. Other tests  Sexually transmitted disease (STD)  testing.  Bone density scan. This is done to screen for osteoporosis. You may have this done starting at age 85. Follow these instructions at home: Eating and drinking  Eat a diet that includes fresh fruits and vegetables, whole grains, lean protein, and low-fat dairy products. Limit your intake of foods with high amounts of sugar, saturated fats, and salt.  Take vitamin and mineral supplements as recommended by your health care provider.  Do not drink alcohol if your health care provider tells you not to drink.  If you drink alcohol: ? Limit how much you have to 0-1 drink a day. ? Be aware of how much alcohol is in your drink. In the [place] one drink equals one 12 oz bottle of beer (355 mL), one 5 oz glass of wine (148 mL), or one 1 oz glass of hard liquor (44 mL). Lifestyle  Take daily care of your teeth and gums.  Stay active. Exercise for at least 30 minutes on 5 or more days each week.  Do not use any products that contain nicotine or tobacco, such as cigarettes, e-cigarettes, and chewing tobacco. If you need help quitting, ask your health care provider.  If you are sexually active, practice safe sex. Use a condom or other form of protection in order to prevent STIs (sexually transmitted infections).  Talk with your health care provider about taking a low-dose aspirin or statin. What's next?  Go to your health care provider once a year for a well check visit.  Ask your health care provider how often you should have your eyes and teeth checked.  Stay up to date on all vaccines. This information is not intended to replace advice given to you by your health care provider. Make sure you discuss any questions you have with your health care provider. Document Released: 12/07/2015 Document Revised: 11/04/2018 Document Reviewed: 11/04/2018 Elsevier Patient Education  2020 [employer].

## 2019-06-13 LAB — HEPATITIS C ANTIBODY
Hepatitis C Ab: NONREACTIVE
SIGNAL TO CUT-OFF: 0 (ref <1.00)

## 2019-06-14 ENCOUNTER — Ambulatory Visit: Payer: Medicare Other | Admitting: Family Medicine

## 2019-06-14 ENCOUNTER — Encounter: Payer: Self-pay | Admitting: Family Medicine

## 2019-06-14 ENCOUNTER — Ambulatory Visit: Payer: Self-pay

## 2019-06-14 ENCOUNTER — Other Ambulatory Visit: Payer: Self-pay

## 2019-06-14 VITALS — BP 118/84 | HR 78 | Ht 61.5 in | Wt 133.0 lb

## 2019-06-14 DIAGNOSIS — S83282A Other tear of lateral meniscus, current injury, left knee, initial encounter: Secondary | ICD-10-CM

## 2019-06-14 DIAGNOSIS — M25562 Pain in left knee: Secondary | ICD-10-CM

## 2019-06-14 DIAGNOSIS — S83422A Sprain of lateral collateral ligament of left knee, initial encounter: Secondary | ICD-10-CM

## 2019-06-14 NOTE — Assessment & Plan Note (Signed)
Left lateral meniscal tear.  Aspirated on June 14, 2019.  Discussed the instability of the knee and will need to monitor.  Patient does not want any advanced imaging because it would not change medical management because patient would not make any significant changes to her treatment options.  Patient would not elect for any type of surgical intervention.  Patient still has some mild instability.  Patient will try topical anti-inflammatories and icing regimen and given some home exercises.  We will see how patient responds.

## 2019-06-14 NOTE — Progress Notes (Signed)
[name] [place], [place] [address] Phone: [phone] Subjective:   Nicole [name], am serving as a scribe for Dr. Hulan Saas.   CC: left knee pain   PPI:[id number]  Nicole [name] is a 73 y.o. female coming in with complaint of left knee pain. A few weeks ago she was using a shovel and twisted her knee when pushing shovel. Notes welling at the end of the day. Pain increases with activity. Is active in her yard. Pain anterior knee. Patient has been using ice.       Past Medical History:  Diagnosis Date  . BRCA1 positive   . Breast cancer ([place])    Left- Ductal ca SR-, Chemo ([facility]) rad rx   . GERD (gastroesophageal reflux disease)   . H/O migraine   . HSV-2 (herpes simplex virus 2) infection   . Hypercholesteremia   . Rheumatoid factor positive    OA   had rheum consult  . Scoliosis   . Sprain of ankle 03/06/2015   revewiwed care exercise and fu if needed    . Tinnitus    Past Surgical History:  Procedure Laterality Date  . BREAST LUMPECTOMY    . LAPAROSCOPIC BILATERAL SALPINGO OOPHERECTOMY  2006   Dr. Joan Flores  . MASTECTOMY Bilateral 2006   Social History   Socioeconomic History  . Marital status: Married    Spouse name: Not on file  . Number of children: Not on file  . Years of education: Not on file  . Highest education level: Not on file  Occupational History  . Not on file  Social Needs  . Financial resource strain: Not on file  . Food insecurity    Worry: Not on file    Inability: Not on file  . Transportation needs    Medical: Not on file    Non-medical: Not on file  Tobacco Use  . Smoking status: Former Research scientist (life sciences)  . Smokeless tobacco: Never Used  . Tobacco comment: limited in college   Substance and Sexual Activity  . Alcohol use: Yes    Alcohol/week: 6.0 standard drinks    Types: 5 Glasses of wine, 1 Shots of liquor per week    Comment: one glass of wine on weekends  . Drug use: [name]  . Sexual  activity: Not Currently    Birth control/protection: Post-menopausal  Lifestyle  . Physical activity    Days per week: Not on file    Minutes per session: Not on file  . Stress: Not on file  Relationships  . Social Herbalist on phone: Not on file    Gets together: Not on file    Attends religious service: Not on file    Active member of club or organization: Not on file    Attends meetings of clubs or organizations: Not on file    Relationship status: Not on file  Other Topics Concern  . Not on file  Social History Narrative   Married, household of 2. 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 Designated Party release signed 04/17/10.      Husband with final stage prostate cancer at this time.   hh of 2    Allergies  Allergen Reactions  . Sulfonamide Derivatives Nausea And Vomiting  . Latex Rash   Family History  Problem Relation Age of Onset  . Hypertension Mother   . Heart disease Mother   . Stroke Mother   .  Arthritis Mother   . Heart attack Brother   . Arthritis Unknown        maternal side RA cousin  . Kidney disease Unknown        maternal side  . Diabetes Unknown        1st degree relative, paternal side  . Diabetes Father   . Lung cancer Father      Current Outpatient Medications (Cardiovascular):  .  lovastatin (MEVACOR) 20 MG tablet, Take 1 tablet (20 mg total) by mouth daily.     Current Outpatient Medications (Other):  .  acyclovir (ZOVIRAX) 200 MG capsule, TAKE TWO CAPSULES BY MOUTH THREE TIMES DAILY FOR OUTBREAK .  acyclovir ointment (ZOVIRAX) 5 %, Apply 1 application topically 3 (three) times daily as needed. Marland Kitchen  b complex vitamins tablet, Take 1 tablet by mouth daily. .  calcium carbonate (OS-CAL) 600 MG TABS, Take 600 mg by mouth 3 (three) times daily with meals.   .  famotidine (PEPCID) 20 MG tablet, Take 20 mg by mouth 2 (two) times daily. .  fish oil-omega-3 fatty acids 1000 MG capsule, Take 1 g by mouth daily.   .  Magnesium 300 MG CAPS, Take 1 capsule by mouth daily.  .  ranitidine (ZANTAC) 150 MG tablet, TAKE 1 TABLET BY MOUTH TWO TIMES A DAY .  VITAMIN A PO, Take by mouth daily. .  Vitamin D, Cholecalciferol, 1000 UNITS CAPS, Take by mouth.    Past medical history, social, surgical and family history all reviewed in electronic medical record.  [name] pertanent information unless stated regarding to the chief complaint.   Review of Systems:  [name] headache, visual changes, nausea, vomiting, diarrhea, constipation, dizziness, abdominal pain, skin rash, fevers, chills, night sweats, weight loss, swollen lymph nodes, body aches, joint swelling, muscle aches, chest pain, shortness of breath, mood changes.   Objective  Blood pressure 118/84, pulse 78, height 5' 1.5" (1.562 m), weight 133 lb (60.3 kg), last menstrual period 11/24/1997, SpO2 99 %.    General: [name] apparent distress alert and oriented x3 mood and affect normal, dressed appropriately.  HEENT: Pupils equal, extraocular movements intact  Respiratory: Patient's speak in full sentences and does not appear short of breath  Cardiovascular: [name] lower extremity edema, non tender, [name] erythema  Skin: Warm dry intact with [name] signs of infection or rash on extremities or on axial skeleton.  Abdomen: Soft nontender  Neuro: Cranial nerves II through XII are intact, neurovascularly intact in all extremities with 2+ DTRs and 2+ pulses.  Lymph: [name] lymphadenopathy of posterior or anterior cervical chain or axillae bilaterally.  Gait antalgic MSK:  tender with mild limited range of motion and good stability and symmetric strength and tone of shoulders, elbows, wrist, hip, and ankles bilaterally.  Left knee exam does have an effusion noted.  A limited lacking the last 10 degrees of flexion in the last 5 degrees of extension.  Patient is tender to palpation over the lateral joint line.  Patient does have instability with valgus force.  [name] gapping noted of the LCL.   Positive McMurray's.  Neurovascular intact distally.  Limited musculoskeletal ultrasound was performed and interpreted by Lyndal Pulley Limited ultrasound shows the patient does have a large effusion.  Appears to have a very large lateral meniscal tear greater than 50% of the meniscus with greater than 50% displacement.  LCL difficult to see secondary to the hypoechoic changes and increasing Doppler flow.  [name] masses appreciated  Procedure: Real-time Ultrasound Guided  Injection of left knee Device: GE Logiq Q7 Ultrasound guided injection is preferred based studies that show increased duration, increased effect, greater accuracy, decreased procedural pain, increased response rate, and decreased cost with ultrasound guided versus blind injection.  Verbal informed consent obtained.  Time-out conducted.  Noted [name] overlying erythema, induration, or other signs of local infection.  Skin prepped in a sterile fashion.  Local anesthesia: Topical Ethyl chloride.  With sterile technique and under real time ultrasound guidance: With a 22-gauge 2 inch needle patient was injected with 4 cc of 0.5% Marcaine and aspirated 32 cc of straw-colored colored fluid then injected 1 cc of Kenalog 40 mg/dL. This was from a superior lateral approach.  Completed without difficulty  Pain immediately resolved suggesting accurate placement of the medication.  Advised to call if fevers/chills, erythema, induration, drainage, or persistent bleeding.  Images permanently stored and available for review in the ultrasound unit.  Impression: Technically successful ultrasound guided injection.    Impression and Recommendations:     This case required medical decision making of moderate complexity. The above documentation has been reviewed and is accurate and complete Lyndal Pulley, DO       Note: This dictation was prepared with Dragon dictation along with smaller phrase technology. Any transcriptional errors that result  from this process are unintentional.

## 2019-06-14 NOTE — Patient Instructions (Signed)
Good to see you.  Ice 20 minutes 2 times daily. Usually after activity and before bed. Exercises 3 times a week.  Voltaren gel 2x daily Turmeric 500mg  daily  Tart cherry extract 1200mg  at night Vitamin D 2000 IU daily  See me again in 4-5

## 2019-06-14 NOTE — Assessment & Plan Note (Signed)
.    Likely as well.  Patient today.  Monitor closely.  Ultrasound at follow-up.  May need repeat x-rays f/u 4 weeks

## 2019-06-15 ENCOUNTER — Other Ambulatory Visit: Payer: Self-pay

## 2019-06-16 NOTE — Telephone Encounter (Signed)
Sounds like a good plan   And we still should do a repeat  Lab after  You lower the calcium   But make sure hydrated the day of the lab

## 2019-06-17 ENCOUNTER — Telehealth: Payer: Self-pay | Admitting: Internal Medicine

## 2019-06-21 ENCOUNTER — Other Ambulatory Visit: Payer: Self-pay | Admitting: Internal Medicine

## 2019-07-19 ENCOUNTER — Ambulatory Visit: Payer: Medicare Other | Admitting: Family Medicine

## 2019-08-03 ENCOUNTER — Ambulatory Visit: Payer: Medicare Other | Admitting: Family Medicine

## 2019-08-03 ENCOUNTER — Encounter: Payer: Self-pay | Admitting: Family Medicine

## 2019-08-03 ENCOUNTER — Other Ambulatory Visit: Payer: Self-pay

## 2019-08-03 VITALS — BP 110/68 | HR 87 | Ht 61.0 in

## 2019-08-03 DIAGNOSIS — M25569 Pain in unspecified knee: Secondary | ICD-10-CM

## 2019-08-03 DIAGNOSIS — S83422A Sprain of lateral collateral ligament of left knee, initial encounter: Secondary | ICD-10-CM | POA: Diagnosis not present

## 2019-08-03 DIAGNOSIS — G8929 Other chronic pain: Secondary | ICD-10-CM | POA: Diagnosis not present

## 2019-08-03 NOTE — Assessment & Plan Note (Signed)
Patient feels like he is doing approximately 75% better with instability of the knee still noted.  Patient would not want to change any significant treatment at this moment but is encouraged to try formal physical therapy and will be referred today.  Patient will continue to wear the hinged brace.  Patient wants to avoid any type of surgical intervention and will hold at the moment advanced imaging consider not change our management.  Follow-up with me again in 6 weeks.  If no improvement on ultrasound and again shows inflammation and swelling or instability I would encourage patient to have advanced imaging dislocation is options.  Patient is in agreement with the plan.  Spent  25 minutes with patient face-to-face and had greater than 50% of counseling including as described above in assessment and plan.

## 2019-08-03 NOTE — Patient Instructions (Signed)
PT will call you Enjoy the beach See [place] again in 6 weeks

## 2019-08-03 NOTE — Progress Notes (Signed)
[name] [place], [place] [address] Phone: [phone] Subjective:   I Nicole [name] am serving as a Education administrator for Dr. Hulan Saas.    CC: Knee pain follow-up  [id number]     08/03/2019 Nicole [name] is a 73 y.o. female coming in with complaint of left knee pain. Patient states the knee feels better.  Found to have a meniscal tear.  Patient has been making some progress.  There was concern for an LCL tear.  Patient states that the knee does still Give discomfort with not wearing the brace.  Does do a lot of gardening and is able to do it in the brace but not without      Past Medical History:  Diagnosis Date  . BRCA1 positive   . Breast cancer ([place])    Left- Ductal ca SR-, Chemo ([facility]) rad rx   . GERD (gastroesophageal reflux disease)   . H/O migraine   . HSV-2 (herpes simplex virus 2) infection   . Hypercholesteremia   . Rheumatoid factor positive    OA   had rheum consult  . Scoliosis   . Sprain of ankle 03/06/2015   revewiwed care exercise and fu if needed    . Tinnitus    Past Surgical History:  Procedure Laterality Date  . BREAST LUMPECTOMY    . LAPAROSCOPIC BILATERAL SALPINGO OOPHERECTOMY  2006   Dr. Joan Flores  . MASTECTOMY Bilateral 2006   Social History   Socioeconomic History  . Marital status: Married    Spouse name: Not on file  . Number of children: Not on file  . Years of education: Not on file  . Highest education level: Not on file  Occupational History  . Not on file  Social Needs  . Financial resource strain: Not on file  . Food insecurity    Worry: Not on file    Inability: Not on file  . Transportation needs    Medical: Not on file    Non-medical: Not on file  Tobacco Use  . Smoking status: Former Research scientist (life sciences)  . Smokeless tobacco: Never Used  . Tobacco comment: limited in college   Substance and Sexual Activity  . Alcohol use: Yes    Alcohol/week: 6.0 standard drinks    Types: 5 Glasses  of wine, 1 Shots of liquor per week    Comment: one glass of wine on weekends  . Drug use: No  . Sexual activity: Not Currently    Birth control/protection: Post-menopausal  Lifestyle  . Physical activity    Days per week: Not on file    Minutes per session: Not on file  . Stress: Not on file  Relationships  . Social Herbalist on phone: Not on file    Gets together: Not on file    Attends religious service: Not on file    Active member of club or organization: Not on file    Attends meetings of clubs or organizations: Not on file    Relationship status: Not on file  Other Topics Concern  . Not on file  Social History Narrative   Married, household of 2. 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 Designated Party release signed 04/17/10.      Husband with final stage prostate cancer at this time.   hh of 2    Allergies  Allergen Reactions  . Sulfonamide Derivatives Nausea And Vomiting  . Latex Rash  Family History  Problem Relation Age of Onset  . Hypertension Mother   . Heart disease Mother   . Stroke Mother   . Arthritis Mother   . Heart attack Brother   . Arthritis Unknown        maternal side RA cousin  . Kidney disease Unknown        maternal side  . Diabetes Unknown        1st degree relative, paternal side  . Diabetes Father   . Lung cancer Father      Current Outpatient Medications (Cardiovascular):  .  lovastatin (MEVACOR) 20 MG tablet, TAKE ONE TABLET BY MOUTH ONE TIME DAILY      Current Outpatient Medications (Other):  .  acyclovir (ZOVIRAX) 200 MG capsule, TAKE TWO CAPSULES BY MOUTH THREE TIMES DAILY FOR OUTBREAK .  acyclovir ointment (ZOVIRAX) 5 %, Apply 1 application topically 3 (three) times daily as needed. Marland Kitchen  b complex vitamins tablet, Take 1 tablet by mouth daily. .  calcium carbonate (OS-CAL) 600 MG TABS, Take 600 mg by mouth 3 (three) times daily with meals.   .  famotidine (PEPCID) 20 MG tablet, Take 20 mg by  mouth 2 (two) times daily. .  fish oil-omega-3 fatty acids 1000 MG capsule, Take 1 g by mouth daily.  .  Magnesium 300 MG CAPS, Take 1 capsule by mouth daily.  .  ranitidine (ZANTAC) 150 MG tablet, TAKE 1 TABLET BY MOUTH TWO TIMES A DAY .  VITAMIN A PO, Take by mouth daily. .  Vitamin D, Cholecalciferol, 1000 UNITS CAPS, Take by mouth.    Past medical history, social, surgical and family history all reviewed in electronic medical record.  No pertanent information unless stated regarding to the chief complaint.   Review of Systems:  No headache, visual changes, nausea, vomiting, diarrhea, constipation, dizziness, abdominal pain, skin rash, fevers, chills, night sweats, weight loss, swollen lymph nodes, body aches, joint swelling, chest pain, shortness of breath, mood changes.  Positive muscle aches  Objective  Blood pressure 110/68, pulse 87, height _0  (1.549 m), last menstrual period 11/24/1997, SpO2 98 %.    General: No apparent distress alert and oriented x3 mood and affect normal, dressed appropriately.  HEENT: Pupils equal, extraocular movements intact  Respiratory: Patient's speak in full sentences and does not appear short of breath  Cardiovascular: No lower extremity edema, non tender, no erythema  Skin: Warm dry intact with no signs of infection or rash on extremities or on axial skeleton.  Abdomen: Soft nontender  Neuro: Cranial nerves II through XII are intact, neurovascularly intact in all extremities with 2+ DTRs and 2+ pulses.  Lymph: No lymphadenopathy of posterior or anterior cervical chain or axillae bilaterally.  Gait antalgic MSK:  Non tender with full range of motion and good stability and symmetric strength and tone of shoulders, elbows, wrist, hip and ankles bilaterally.  Left knee does have some instability noted of the LCL with some gapping noted.  Patient does have a positive McMurray's as well on more on the lateral meniscus.  ACL is intact though.  Trace  effusion noted.  Lacks the last 2 degrees of extension in the last 2 degrees of flexion     Impression and Recommendations:     This case required medical decision making of moderate complexity. The above documentation has been reviewed and is accurate and complete Lyndal Pulley, DO       Note: This dictation was prepared with Dragon dictation  along with smaller phrase technology. Any transcriptional errors that result from this process are unintentional.

## 2019-08-11 ENCOUNTER — Encounter: Payer: Self-pay | Admitting: Physical Therapy

## 2019-08-11 ENCOUNTER — Ambulatory Visit: Payer: Medicare Other | Admitting: Physical Therapy

## 2019-08-11 ENCOUNTER — Other Ambulatory Visit: Payer: Self-pay

## 2019-08-11 DIAGNOSIS — M25562 Pain in left knee: Secondary | ICD-10-CM

## 2019-08-11 NOTE — Therapy (Signed)
[facility] [address] [place], [place], [address] Phone: [phone]   Fax:  [phone]  Physical Therapy Evaluation  Patient Details  Name: Nicole [name] MRN: [id number] Date of Birth: 1946/10/30 Referring Provider (PT): Charlann Boxer   Encounter Date: 08/11/2019  PT End of Session - 08/11/19 2218    Visit Number  1    Number of Visits  12    Date for PT Re-Evaluation  09/22/19    Authorization Type  [employer]    PT Start Time  1440    PT Stop Time  1520    PT Time Calculation (min)  40 min    Activity Tolerance  Patient tolerated treatment well    Behavior During Therapy  [facility] for tasks assessed/performed       Past Medical History:  Diagnosis Date  . BRCA1 positive   . Breast cancer ([place])    Left- Ductal ca SR-, Chemo ([facility]) rad rx   . GERD (gastroesophageal reflux disease)   . H/O migraine   . HSV-2 (herpes simplex virus 2) infection   . Hypercholesteremia   . Rheumatoid factor positive    OA   had rheum consult  . Scoliosis   . Sprain of ankle 03/06/2015   revewiwed care exercise and fu if needed    . Tinnitus     Past Surgical History:  Procedure Laterality Date  . BREAST LUMPECTOMY    . LAPAROSCOPIC BILATERAL SALPINGO OOPHERECTOMY  2006   Dr. Joan Flores  . MASTECTOMY Bilateral 2006    There were no vitals filed for this visit.   Subjective Assessment - 08/11/19 1445    Subjective  Pt had R knee pain about 2 years ago, states "small tear" in knee. Recently she was doing some gardening and had increased pain. Found to have Meniscal tear and LCL tear. She also had injection. Has knee brace that she wears for increased activity and walking.    Currently in Pain?  Yes    Pain Score  5     Pain Location  Knee    Pain Orientation  Left    Pain Descriptors / Indicators  Aching    Pain Type  Acute pain    Pain Onset  More than a month ago    Pain Frequency  Intermittent         [facility] PT Assessment - 08/11/19  0001      Assessment   Medical Diagnosis  L knee pain    Referring Provider (PT)  Charlann Boxer    Prior Therapy  No      Balance Screen   Has the patient fallen in the past 6 months  No      Prior Function   Level of Independence  Independent      Cognition   Overall Cognitive Status  Within Functional Limits for tasks assessed      Posture/Postural Control   Posture Comments  Scoliosis      ROM / Strength   AROM / PROM / Strength  AROM;Strength      AROM   Overall AROM Comments  Mild limitation at end range of flexion, due to pain. Hip ROM: WNL      Strength   Overall Strength Comments  Knee: 4/5, Hip: 4/5       Palpation   Palpation comment  Pain at lateral knee, LCL region and lateral joint line      Special Tests   Other  special tests  unremarkable      Ambulation/Gait   Gait Comments  unremarkable                Objective measurements completed on examination: See above findings.      [facility] Adult PT Treatment/Exercise - 08/11/19 0001      Exercises   Exercises  Knee/Hip             PT Education - 08/11/19 2218    Education Details  PT POC,    Person(s) Educated  Patient    Methods  Explanation    Comprehension  Verbalized understanding       PT Short Term Goals - 08/11/19 2219      PT SHORT TERM GOAL #1   Title  Pt to be independent wtih intial HEP    Time  2    Period  Weeks    Status  New    Target Date  08/25/19        PT Long Term Goals - 08/11/19 2220      PT LONG TERM GOAL #1   Title  Pt to be independent with final HEP    Time  6    Period  Weeks    Status  New    Target Date  09/22/19      PT LONG TERM GOAL #2   Title  Pt to demo increased strength of bil hip and knee to 5/5, to improve stability and pain.    Time  6    Period  Weeks    Status  New    Target Date  09/22/19      PT LONG TERM GOAL #3   Title  Pt to report pain in R knee 0-2/10 with standing activity and IADLs at home.    Time  6    Period   Weeks    Status  New    Target Date  09/22/19             Plan - 08/11/19 2222    Clinical Impression Statement  Pt presents wtih primary complaint of increased pain in L knee. She has mild swelling, and increased pain with increased activity. She has mild strength and ROM deficits. She has decreased endurance for standing and gait due to pain. Pt with decreased abiilt for full functional activites, and will benefit form skilled PT to improve. Pt with lack of effective HEP for Dx and will benefi tfrom education on this.    Examination-Activity Limitations  Locomotion Level;Squat;Stairs;Stand    Examination-Participation Restrictions  Yard Work;Cleaning;Community Activity    Stability/Clinical Decision Making  Stable/Uncomplicated    Clinical Decision Making  Low    Rehab Potential  Good    PT Frequency  2x / week    PT Duration  6 weeks    PT Treatment/Interventions  ADLs/Self Care Home Management;Cryotherapy;Electrical Stimulation;Iontophoresis 12m/ml Dexamethasone;Moist Heat;Ultrasound;DME Instruction;Balance training;Therapeutic exercise;Therapeutic activities;Functional mobility training;Stair training;Gait training;Neuromuscular re-education;Patient/family education;Orthotic Fit/Training;Manual techniques;Passive range of motion;Dry needling;Taping;Vasopneumatic Device;Spinal Manipulations;Joint Manipulations    Consulted and Agree with Plan of Care  Patient       Patient will benefit from skilled therapeutic intervention in order to improve the following deficits and impairments:  Pain, Increased muscle spasms, Decreased mobility, Decreased activity tolerance, Decreased endurance, Decreased strength, Difficulty walking  Visit Diagnosis: Acute pain of left knee     Problem List Patient Active Problem List   Diagnosis Date Noted  . Acute lateral meniscal tear, left,  initial encounter 06/14/2019  . Tear of LCL (lateral collateral ligament) of knee, left, initial encounter  06/14/2019  . BRCA1 positive   . Vitamin D deficiency 04/14/2014  . Osteopenia 04/14/2014  . Encounter for preventive health examination 10/08/2011  . Rheumatoid factor positive   . Chest pain at rest 06/04/2011  . Heart palpitations 06/04/2011  . HSV 08/14/2009  . ARTHRITIS, HANDS, BILATERAL 05/17/2009  . VENOUS INSUFFICIENCY, CHRONIC 06/12/2008  . HYPERLIPIDEMIA 10/13/2007  . GERD 10/13/2007  . UNSPECIFIED ARTHROPATHY MULTIPLE SITES 10/13/2007  . BACK PAIN, LUMBAR 10/13/2007  . OSTEOPENIA 10/13/2007  . SCOLIOSIS 10/13/2007  . HEADACHE 10/13/2007  . ELEVATED BP READING WITHOUT DX HYPERTENSION 10/13/2007  . Personal history of malignant neoplasm of breast 10/13/2007    Lyndee Hensen, PT, DPT 10:28 PM  08/11/19    [facility] [place] [place], [place], [address] Phone: [phone]   Fax:  [phone]  Name: Nicole [name] MRN: [id number] Date of Birth: June 24, 1946

## 2019-08-17 ENCOUNTER — Other Ambulatory Visit: Payer: Self-pay

## 2019-08-17 ENCOUNTER — Encounter: Payer: Self-pay | Admitting: Physical Therapy

## 2019-08-17 ENCOUNTER — Ambulatory Visit: Payer: Medicare Other | Admitting: Physical Therapy

## 2019-08-17 DIAGNOSIS — M25562 Pain in left knee: Secondary | ICD-10-CM | POA: Diagnosis not present

## 2019-08-17 NOTE — Patient Instructions (Addendum)
Access Code: [email or web address]  URL: [email or web address]  Date: 08/17/2019  Prepared by: Lyndee Hensen   Exercises Straight Leg Raise - 10 reps - 2 sets - 1x daily Sidelying Hip Abduction - 10 reps - 2 sets - 1x daily Standing Hip Abduction - 10 reps - 2 sets - 1x daily Seated Knee Extension AROM - 10 reps - 2 sets - 1x daily Seated Hamstring Stretch - 3 reps - 30 hold - 2x daily Standing Marching - 10 reps - 2 sets - 1x daily Tandem Stance - 3 reps - 30 hold - 2x daily

## 2019-08-18 NOTE — Therapy (Signed)
[facility] [address] [place], [place], [address] Phone: [phone]   Fax:  [phone]  Physical Therapy Treatment  Patient Details  Name: Nicole [name] MRN: [id number] Date of Birth: 1946-06-08 Referring Provider (PT): Charlann Boxer   Encounter Date: 08/17/2019  PT End of Session - 08/17/19 1443    Visit Number  2    Number of Visits  12    Date for PT Re-Evaluation  09/22/19    Authorization Type  UHC    PT Start Time  6387    PT Stop Time  1515    PT Time Calculation (min)  40 min    Activity Tolerance  Patient tolerated treatment well    Behavior During Therapy  [facility] for tasks assessed/performed       Past Medical History:  Diagnosis Date  . BRCA1 positive   . Breast cancer ([place])    Left- Ductal ca SR-, Chemo ([facility]) rad rx   . GERD (gastroesophageal reflux disease)   . H/O migraine   . HSV-2 (herpes simplex virus 2) infection   . Hypercholesteremia   . Rheumatoid factor positive    OA   had rheum consult  . Scoliosis   . Sprain of ankle 03/06/2015   revewiwed care exercise and fu if needed    . Tinnitus     Past Surgical History:  Procedure Laterality Date  . BREAST LUMPECTOMY    . LAPAROSCOPIC BILATERAL SALPINGO OOPHERECTOMY  2006   Dr. Joan Flores  . MASTECTOMY Bilateral 2006    There were no vitals filed for this visit.  Subjective Assessment - 08/17/19 1442    Subjective  Pt states only mild soreness today. Feels that swelling is less.    Currently in Pain?  Yes    Pain Score  2     Pain Location  Knee    Pain Orientation  Left    Pain Descriptors / Indicators  Aching    Pain Type  Acute pain    Pain Onset  More than a month ago    Pain Frequency  Intermittent                                 PT Short Term Goals - 08/11/19 2219      PT SHORT TERM GOAL #1   Title  Pt to be independent wtih intial HEP    Time  2    Period  Weeks    Status  New    Target Date   08/25/19        PT Long Term Goals - 08/11/19 2220      PT LONG TERM GOAL #1   Title  Pt to be independent with final HEP    Time  6    Period  Weeks    Status  New    Target Date  09/22/19      PT LONG TERM GOAL #2   Title  Pt to demo increased strength of bil hip and knee to 5/5, to improve stability and pain.    Time  6    Period  Weeks    Status  New    Target Date  09/22/19      PT LONG TERM GOAL #3   Title  Pt to report pain in R knee 0-2/10 with standing activity and IADLs at home.    Time  6  Period  Weeks    Status  New    Target Date  09/22/19            Plan - 08/18/19 1534    Clinical Impression Statement  Pt with minimal pain with activity today. Ther ex progressed for strengthening of hips and knees. Recommended knee pads for pt , because of the amount of gardening/kneeling she wants to be doing. Discussed compromised positions for knee and meniscus. Plan to progress strength as tolerated.    Examination-Activity Limitations  Locomotion Level;Squat;Stairs;Stand    Examination-Participation Restrictions  Yard Work;Cleaning;Community Activity    Stability/Clinical Decision Making  Stable/Uncomplicated    Rehab Potential  Good    PT Frequency  2x / week    PT Duration  6 weeks    PT Treatment/Interventions  ADLs/Self Care Home Management;Cryotherapy;Electrical Stimulation;Iontophoresis 35m/ml Dexamethasone;Moist Heat;Ultrasound;DME Instruction;Balance training;Therapeutic exercise;Therapeutic activities;Functional mobility training;Stair training;Gait training;Neuromuscular re-education;Patient/family education;Orthotic Fit/Training;Manual techniques;Passive range of motion;Dry needling;Taping;Vasopneumatic Device;Spinal Manipulations;Joint Manipulations    Consulted and Agree with Plan of Care  Patient       Patient will benefit from skilled therapeutic intervention in order to improve the following deficits and impairments:  Pain, Increased muscle  spasms, Decreased mobility, Decreased activity tolerance, Decreased endurance, Decreased strength, Difficulty walking  Visit Diagnosis: Acute pain of left knee     Problem List Patient Active Problem List   Diagnosis Date Noted  . Acute lateral meniscal tear, left, initial encounter 06/14/2019  . Tear of LCL (lateral collateral ligament) of knee, left, initial encounter 06/14/2019  . BRCA1 positive   . Vitamin D deficiency 04/14/2014  . Osteopenia 04/14/2014  . Encounter for preventive health examination 10/08/2011  . Rheumatoid factor positive   . Chest pain at rest 06/04/2011  . Heart palpitations 06/04/2011  . HSV 08/14/2009  . ARTHRITIS, HANDS, BILATERAL 05/17/2009  . VENOUS INSUFFICIENCY, CHRONIC 06/12/2008  . HYPERLIPIDEMIA 10/13/2007  . GERD 10/13/2007  . UNSPECIFIED ARTHROPATHY MULTIPLE SITES 10/13/2007  . BACK PAIN, LUMBAR 10/13/2007  . OSTEOPENIA 10/13/2007  . SCOLIOSIS 10/13/2007  . HEADACHE 10/13/2007  . ELEVATED BP READING WITHOUT DX HYPERTENSION 10/13/2007  . Personal history of malignant neoplasm of breast 10/13/2007    LLyndee Hensen PT, DPT 3:35 PM  08/18/19    [place] [address]Phone: [phone]  Fax:  [phone] Name: Nicole HUNTSBERRYMRN: [id number] of Birth: 8Sep 16, 1947

## 2019-08-20 ENCOUNTER — Other Ambulatory Visit: Payer: Self-pay

## 2019-08-20 ENCOUNTER — Ambulatory Visit (INDEPENDENT_AMBULATORY_CARE_PROVIDER_SITE_OTHER): Payer: Medicare Other

## 2019-08-20 DIAGNOSIS — Z23 Encounter for immunization: Secondary | ICD-10-CM | POA: Diagnosis not present

## 2019-08-24 ENCOUNTER — Encounter: Payer: Self-pay | Admitting: Physical Therapy

## 2019-08-24 ENCOUNTER — Other Ambulatory Visit: Payer: Self-pay

## 2019-08-24 ENCOUNTER — Ambulatory Visit: Payer: Medicare Other | Admitting: Physical Therapy

## 2019-08-24 DIAGNOSIS — M25562 Pain in left knee: Secondary | ICD-10-CM

## 2019-08-24 NOTE — Therapy (Signed)
[facility] [address] [place], [place], [address] Phone: [phone]   Fax:  [phone]  Physical Therapy Treatment  Patient Details  Name: Nicole [name] MRN: [id number] Date of Birth: Apr 29, 1946 Referring Provider (PT): Charlann Boxer   Encounter Date: 08/24/2019  PT End of Session - 08/24/19 1524    Visit Number  3    Number of Visits  12    Date for PT Re-Evaluation  09/22/19    Authorization Type  UHC    PT Start Time  7517    PT Stop Time  1558    PT Time Calculation (min)  41 min    Activity Tolerance  Patient tolerated treatment well    Behavior During Therapy  [facility] for tasks assessed/performed       Past Medical History:  Diagnosis Date  . BRCA1 positive   . Breast cancer ([place] [facility])    Left- Ductal ca SR-, Chemo ([facility]) rad rx   . GERD (gastroesophageal reflux disease)   . H/O migraine   . HSV-2 (herpes simplex virus 2) infection   . Hypercholesteremia   . Rheumatoid factor positive    OA   had rheum consult  . Scoliosis   . Sprain of ankle 03/06/2015   revewiwed care exercise and fu if needed    . Tinnitus     Past Surgical History:  Procedure Laterality Date  . BREAST LUMPECTOMY    . LAPAROSCOPIC BILATERAL SALPINGO OOPHERECTOMY  2006   Dr. Joan Flores  . MASTECTOMY Bilateral 2006    There were no vitals filed for this visit.  Subjective Assessment - 08/24/19 1523    Subjective  Pt doing well, less pain overall. Lost her HEP    Currently in Pain?  Yes    Pain Score  3     Pain Location  Knee    Pain Orientation  Left    Pain Descriptors / Indicators  Aching    Pain Type  Acute pain    Pain Radiating Towards  Pain up to 4/10 with stepping up/down with L knee    Pain Onset  More than a month ago    Pain Frequency  Intermittent                       [facility] Adult PT Treatment/Exercise - 08/24/19 1526      Knee/Hip Exercises: Stretches   Active Hamstring Stretch  --      Knee/Hip  Exercises: Aerobic   Stationary Bike  L2 x 5 min, L1 x3 min      Knee/Hip Exercises: Standing   Heel Raises  20 reps    Hip Flexion  20 reps    Hip Abduction  20 reps;Both    Other Standing Knee Exercises  walk/march 10 ft x4;     Other Standing Knee Exercises  Tandem walk 10 ft x4;       Knee/Hip Exercises: Seated   Long Arc Quad  20 reps;Both    Long Arc Quad Weight  2 lbs.      Knee/Hip Exercises: Supine   Straight Leg Raises  20 reps;Both    Other Supine Knee/Hip Exercises  Ab set x10; Dead bug x10; Education on TA activation     Other Supine Knee/Hip Exercises  standing QL stretch on R for scoliosis 30 sec x2 ;       Knee/Hip Exercises: Sidelying   Hip ABduction  20 reps;Both  PT Education - 08/24/19 1601    Education Details  HEP updated, reviewed       PT Short Term Goals - 08/24/19 1524      PT SHORT TERM GOAL #1   Title  Pt to be independent wtih intial HEP    Time  2    Period  Weeks    Status  Achieved    Target Date  08/25/19        PT Long Term Goals - 08/11/19 2220      PT LONG TERM GOAL #1   Title  Pt to be independent with final HEP    Time  6    Period  Weeks    Status  New    Target Date  09/22/19      PT LONG TERM GOAL #2   Title  Pt to demo increased strength of bil hip and knee to 5/5, to improve stability and pain.    Time  6    Period  Weeks    Status  New    Target Date  09/22/19      PT LONG TERM GOAL #3   Title  Pt to report pain in R knee 0-2/10 with standing activity and IADLs at home.    Time  6    Period  Weeks    Status  New    Target Date  09/22/19            Plan - 08/24/19 1601    Clinical Impression Statement  Pt with several questions about her back, posture, scoliosis today. Discussed posture, positioning, stretching and will review HEP for back next visit. Pt doing well with strengthening for knee, minimal soreness with activity today. Will progress to stairs next visit, and progress  strength as tolerated.    Examination-Activity Limitations  Locomotion Level;Squat;Stairs;Stand    Examination-Participation Restrictions  Yard Work;Cleaning;Community Activity    Stability/Clinical Decision Making  Stable/Uncomplicated    Rehab Potential  Good    PT Frequency  2x / week    PT Duration  6 weeks    PT Treatment/Interventions  ADLs/Self Care Home Management;Cryotherapy;Electrical Stimulation;Iontophoresis 62m/ml Dexamethasone;Moist Heat;Ultrasound;DME Instruction;Balance training;Therapeutic exercise;Therapeutic activities;Functional mobility training;Stair training;Gait training;Neuromuscular re-education;Patient/family education;Orthotic Fit/Training;Manual techniques;Passive range of motion;Dry needling;Taping;Vasopneumatic Device;Spinal Manipulations;Joint Manipulations    Consulted and Agree with Plan of Care  Patient       Patient will benefit from skilled therapeutic intervention in order to improve the following deficits and impairments:  Pain, Increased muscle spasms, Decreased mobility, Decreased activity tolerance, Decreased endurance, Decreased strength, Difficulty walking  Visit Diagnosis: Acute pain of left knee     Problem List Patient Active Problem List   Diagnosis Date Noted  . Acute lateral meniscal tear, left, initial encounter 06/14/2019  . Tear of LCL (lateral collateral ligament) of knee, left, initial encounter 06/14/2019  . BRCA1 positive   . Vitamin D deficiency 04/14/2014  . Osteopenia 04/14/2014  . Encounter for preventive health examination 10/08/2011  . Rheumatoid factor positive   . Chest pain at rest 06/04/2011  . Heart palpitations 06/04/2011  . HSV 08/14/2009  . ARTHRITIS, HANDS, BILATERAL 05/17/2009  . VENOUS INSUFFICIENCY, CHRONIC 06/12/2008  . HYPERLIPIDEMIA 10/13/2007  . GERD 10/13/2007  . UNSPECIFIED ARTHROPATHY MULTIPLE SITES 10/13/2007  . BACK PAIN, LUMBAR 10/13/2007  . OSTEOPENIA 10/13/2007  . SCOLIOSIS 10/13/2007  .  HEADACHE 10/13/2007  . ELEVATED BP READING WITHOUT DX HYPERTENSION 10/13/2007  . Personal history of malignant neoplasm of breast 10/13/2007  Lyndee Hensen, PT, DPT 4:03 PM  08/24/19    [facility] [place], [place], [address] Phone: [phone]   Fax:  [phone]  Name: Nicole [name] MRN: [id number] Date of Birth: 1946-06-27

## 2019-08-30 ENCOUNTER — Encounter: Payer: Medicare Other | Admitting: Physical Therapy

## 2019-08-30 ENCOUNTER — Encounter: Payer: Self-pay | Admitting: Physical Therapy

## 2019-08-30 ENCOUNTER — Other Ambulatory Visit: Payer: Self-pay

## 2019-08-30 ENCOUNTER — Ambulatory Visit: Payer: Medicare Other | Admitting: Physical Therapy

## 2019-08-30 DIAGNOSIS — M25562 Pain in left knee: Secondary | ICD-10-CM

## 2019-08-30 NOTE — Therapy (Addendum)
[facility] [address] [place], [place], [address] Phone: [phone]   Fax:  [phone]  Physical Therapy Treatment  Patient Details  Name: Nicole [name] MRN: [id number] Date of Birth: 03/15/1946 Referring Provider (PT): Charlann Boxer   Encounter Date: 08/30/2019  PT End of Session - 08/30/19 1245    Visit Number  4    Number of Visits  12    Date for PT Re-Evaluation  09/22/19    Authorization Type  UHC    PT Start Time  1105    PT Stop Time  1150    PT Time Calculation (min)  45 min    Activity Tolerance  Patient tolerated treatment well    Behavior During Therapy  [facility] for tasks assessed/performed       Past Medical History:  Diagnosis Date  . BRCA1 positive   . Breast cancer ([place])    Left- Ductal ca SR-, Chemo ([facility]) rad rx   . GERD (gastroesophageal reflux disease)   . H/O migraine   . HSV-2 (herpes simplex virus 2) infection   . Hypercholesteremia   . Rheumatoid factor positive    OA   had rheum consult  . Scoliosis   . Sprain of ankle 03/06/2015   revewiwed care exercise and fu if needed    . Tinnitus     Past Surgical History:  Procedure Laterality Date  . BREAST LUMPECTOMY    . LAPAROSCOPIC BILATERAL SALPINGO OOPHERECTOMY  2006   Dr. Joan Flores  . MASTECTOMY Bilateral 2006    There were no vitals filed for this visit.  Subjective Assessment - 08/30/19 1243    Subjective  Pt brought an entire folder of home exercises from previous PT , and has questions about which to continue doing. States pain in knee is improving, less swelling, still sore medially, laterally, and anteriorly at times.    Currently in Pain?  Yes    Pain Score  2     Pain Location  Knee    Pain Orientation  Left    Pain Descriptors / Indicators  Aching    Pain Type  Acute pain    Pain Onset  More than a month ago    Pain Frequency  Intermittent                       [facility] Adult PT Treatment/Exercise - 08/30/19  0001      Self-Care   Self-Care  Other Self-Care Comments    Other Self-Care Comments   HEP      Knee/Hip Exercises: Stretches   Active Hamstring Stretch  2 reps;30 seconds;Right    Active Hamstring Stretch Limitations  seated and supine with strap      Knee/Hip Exercises: Aerobic   Stationary Bike  L2 x10 min      Knee/Hip Exercises: Standing   Hip Abduction  20 reps;Both    SLS  20 sec x2 bil;     Other Standing Knee Exercises  Tandem walk 10 ft x4;       Knee/Hip Exercises: Seated   Long Arc Quad  20 reps;Both    Long Arc Quad Weight  2 lbs.      Knee/Hip Exercises: Supine   Straight Leg Raises  20 reps;Both    Other Supine Knee/Hip Exercises  Review of Ab set, TA with march for HEP;     Other Supine Knee/Hip Exercises  standing QL stretch on R for scoliosis  30 sec x2 ;       Modalities   Modalities  Iontophoresis      Iontophoresis   Type of Iontophoresis  Dexamethasone    Location  L lateral knee    Time  6 hr patch             PT Education - 08/30/19 1245    Education Details  HEP updated for knee and back    Person(s) Educated  Patient    Methods  Explanation;Demonstration;Tactile cues;Verbal cues;Handout    Comprehension  Verbalized understanding;Returned demonstration;Verbal cues required;Tactile cues required       PT Short Term Goals - 08/24/19 1524      PT SHORT TERM GOAL #1   Title  Pt to be independent wtih intial HEP    Time  2    Period  Weeks    Status  Achieved    Target Date  08/25/19        PT Long Term Goals - 08/11/19 2220      PT LONG TERM GOAL #1   Title  Pt to be independent with final HEP    Time  6    Period  Weeks    Status  New    Target Date  09/22/19      PT LONG TERM GOAL #2   Title  Pt to demo increased strength of bil hip and knee to 5/5, to improve stability and pain.    Time  6    Period  Weeks    Status  New    Target Date  09/22/19      PT LONG TERM GOAL #3   Title  Pt to report pain in R knee 0-2/10  with standing activity and IADLs at home.    Time  6    Period  Weeks    Status  New    Target Date  09/22/19            Plan - 08/30/19 1246    Clinical Impression Statement  HEP for knee and back reviewed in detail today. Pt has improving ability for activity and strengthening with LEs. But does have soreness at medial and lateral knee. [facility] done at [facility] for pain. Pt to benefit from 1-3 more visits for pain relief, pt will be out of town next week, discussed importance of continuing HEP.    Examination-Activity Limitations  Locomotion Level;Squat;Stairs;Stand    Examination-Participation Restrictions  Yard Work;Cleaning;Community Activity    Stability/Clinical Decision Making  Stable/Uncomplicated    Rehab Potential  Good    PT Frequency  2x / week    PT Duration  6 weeks    PT Treatment/Interventions  ADLs/Self Care Home Management;Cryotherapy;Electrical Stimulation;Iontophoresis '4mg'$ /ml Dexamethasone;Moist Heat;Ultrasound;DME Instruction;Balance training;Therapeutic exercise;Therapeutic activities;Functional mobility training;Stair training;Gait training;Neuromuscular re-education;Patient/family education;Orthotic Fit/Training;Manual techniques;Passive range of motion;Dry needling;Taping;Vasopneumatic Device;Spinal Manipulations;Joint Manipulations    Consulted and Agree with Plan of Care  Patient       Patient will benefit from skilled therapeutic intervention in order to improve the following deficits and impairments:  Pain, Increased muscle spasms, Decreased mobility, Decreased activity tolerance, Decreased endurance, Decreased strength, Difficulty walking  Visit Diagnosis: Acute pain of left knee     Problem List Patient Active Problem List   Diagnosis Date Noted  . Acute lateral meniscal tear, left, initial encounter 06/14/2019  . Tear of LCL (lateral collateral ligament) of knee, left, initial encounter 06/14/2019  . BRCA1 positive   . Vitamin D deficiency  04/14/2014  . Osteopenia 04/14/2014  . Encounter for preventive health examination 10/08/2011  . Rheumatoid factor positive   . Chest pain at rest 06/04/2011  . Heart palpitations 06/04/2011  . HSV 08/14/2009  . ARTHRITIS, HANDS, BILATERAL 05/17/2009  . VENOUS INSUFFICIENCY, CHRONIC 06/12/2008  . HYPERLIPIDEMIA 10/13/2007  . GERD 10/13/2007  . UNSPECIFIED ARTHROPATHY MULTIPLE SITES 10/13/2007  . BACK PAIN, LUMBAR 10/13/2007  . OSTEOPENIA 10/13/2007  . SCOLIOSIS 10/13/2007  . HEADACHE 10/13/2007  . ELEVATED BP READING WITHOUT DX HYPERTENSION 10/13/2007  . Personal history of malignant neoplasm of breast 10/13/2007     Lyndee Hensen, PT, DPT 12:50 PM  08/30/19   [facility] [address] [place], [place], [address] Phone: [phone]   Fax:  [phone]  Name: Nicole [name] MRN: [id number] Date of Birth: [birth year]    PHYSICAL THERAPY DISCHARGE SUMMARY  Visits from Start of Care:    4 Plan: Patient agrees to discharge.  Patient goals were partially met. Patient is being discharged due to not returning since the last visit.  ?????      Lyndee Hensen, PT, DPT 10:58 AM  11/28/19

## 2019-08-31 ENCOUNTER — Encounter: Payer: Medicare Other | Admitting: Physical Therapy

## 2019-09-06 ENCOUNTER — Ambulatory Visit: Payer: Medicare Other

## 2019-09-14 ENCOUNTER — Other Ambulatory Visit: Payer: Self-pay

## 2019-09-14 ENCOUNTER — Ambulatory Visit: Payer: Medicare Other | Admitting: Family Medicine

## 2019-09-14 ENCOUNTER — Ambulatory Visit: Payer: Self-pay

## 2019-09-14 ENCOUNTER — Ambulatory Visit (INDEPENDENT_AMBULATORY_CARE_PROVIDER_SITE_OTHER)
Admission: RE | Admit: 2019-09-14 | Discharge: 2019-09-14 | Disposition: A | Discharge status: Home / Self Care | Payer: Medicare Other | Source: Ambulatory Visit | Attending: Family Medicine | Admitting: Family Medicine

## 2019-09-14 ENCOUNTER — Encounter: Payer: Self-pay | Admitting: Family Medicine

## 2019-09-14 ENCOUNTER — Ambulatory Visit (INDEPENDENT_AMBULATORY_CARE_PROVIDER_SITE_OTHER): Payer: Medicare Other | Admitting: Family Medicine

## 2019-09-14 VITALS — BP 132/84 | HR 76 | Ht 61.0 in | Wt 136.0 lb

## 2019-09-14 DIAGNOSIS — S83282A Other tear of lateral meniscus, current injury, left knee, initial encounter: Secondary | ICD-10-CM | POA: Diagnosis not present

## 2019-09-14 DIAGNOSIS — M25561 Pain in right knee: Secondary | ICD-10-CM

## 2019-09-14 DIAGNOSIS — G8929 Other chronic pain: Secondary | ICD-10-CM

## 2019-09-14 IMAGING — DX DG KNEE COMPLETE 4+V*L*
4 series · 4 of 4 positions shown · non-contrast
Comparison: None.

CLINICAL DATA: Left knee pain

EXAM:
LEFT KNEE - COMPLETE 4+ VIEW

[knee ap]
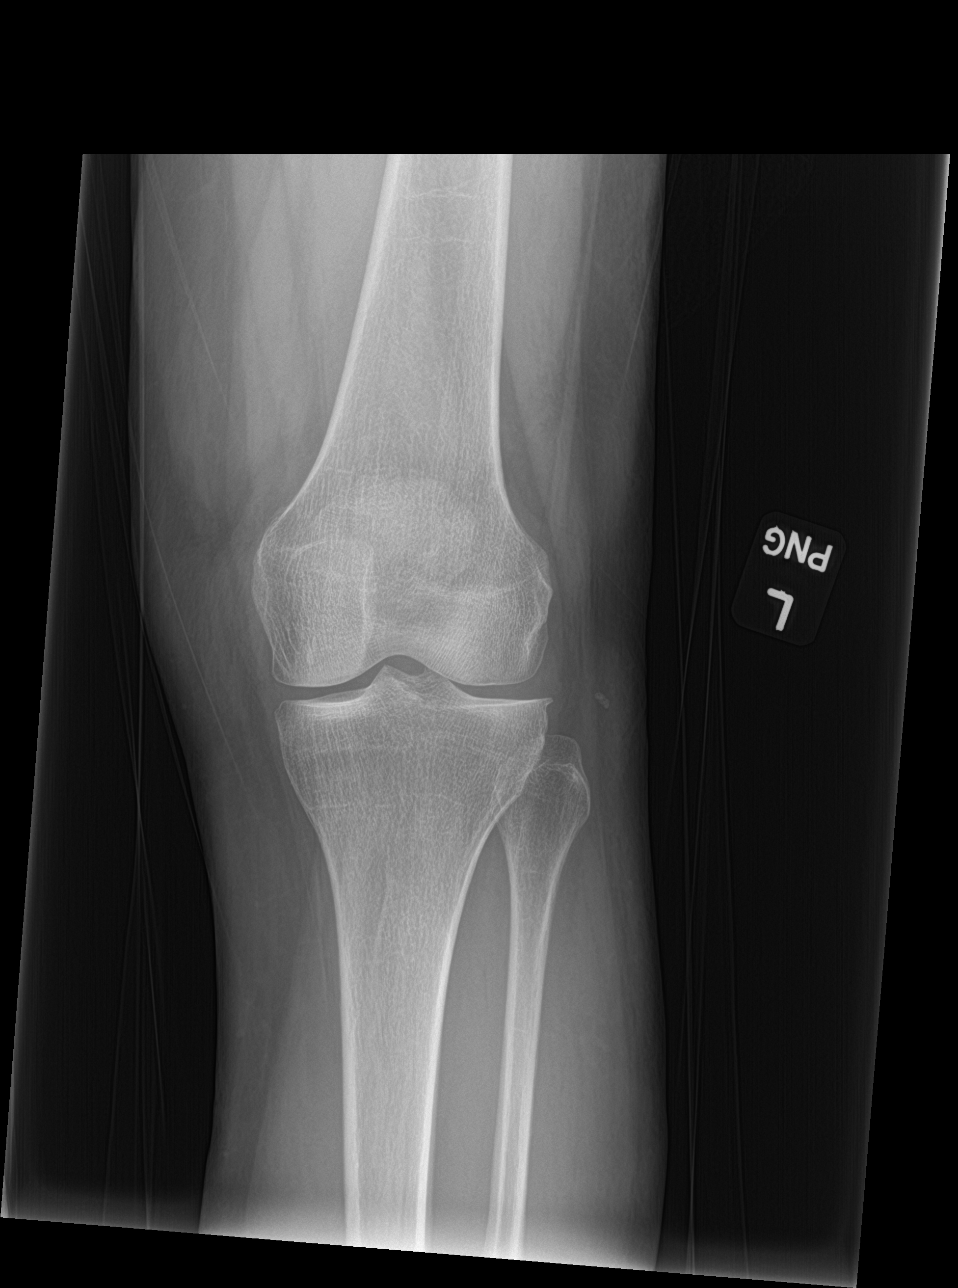

[knee tunnel]
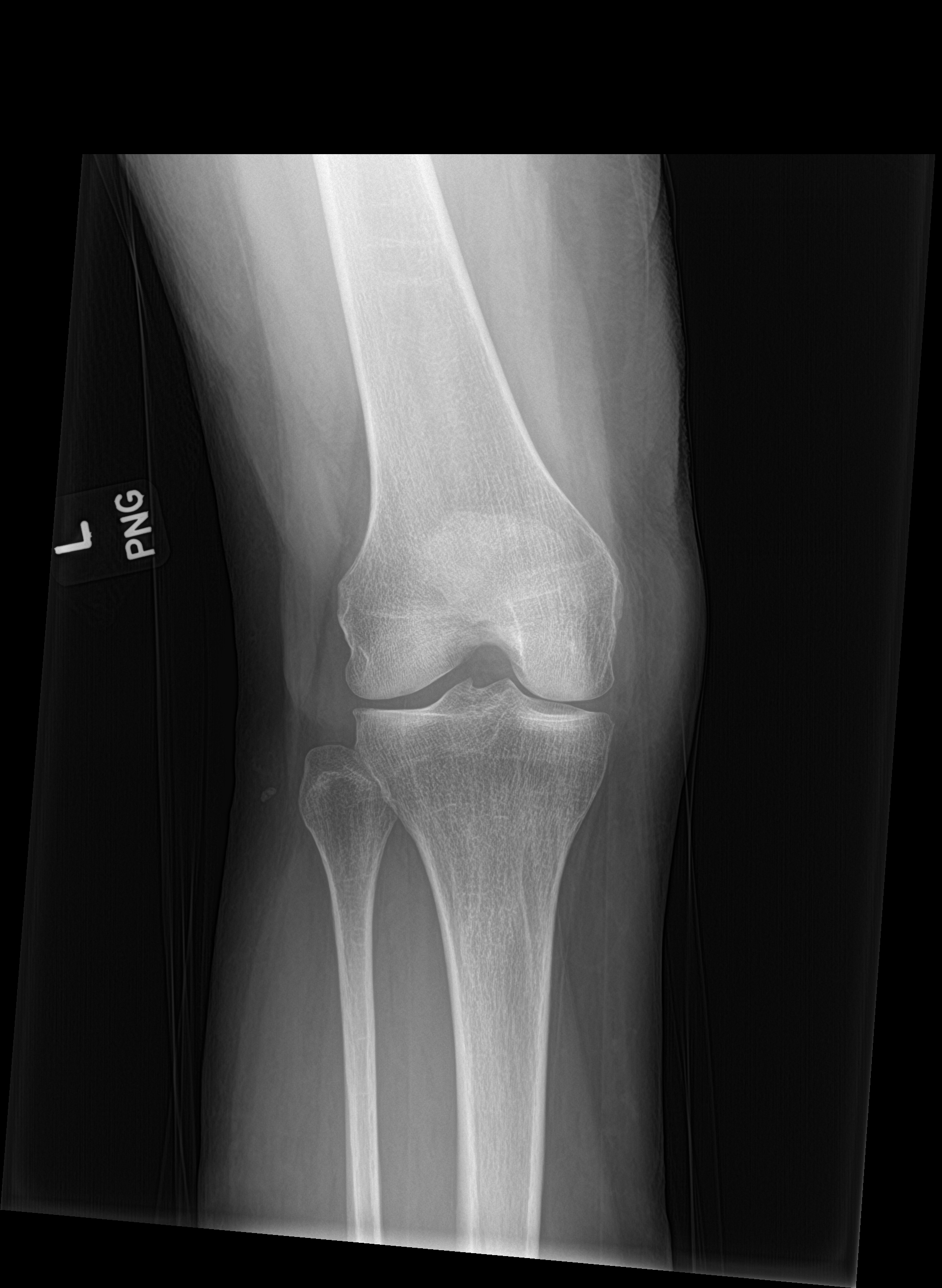

[knee lat]
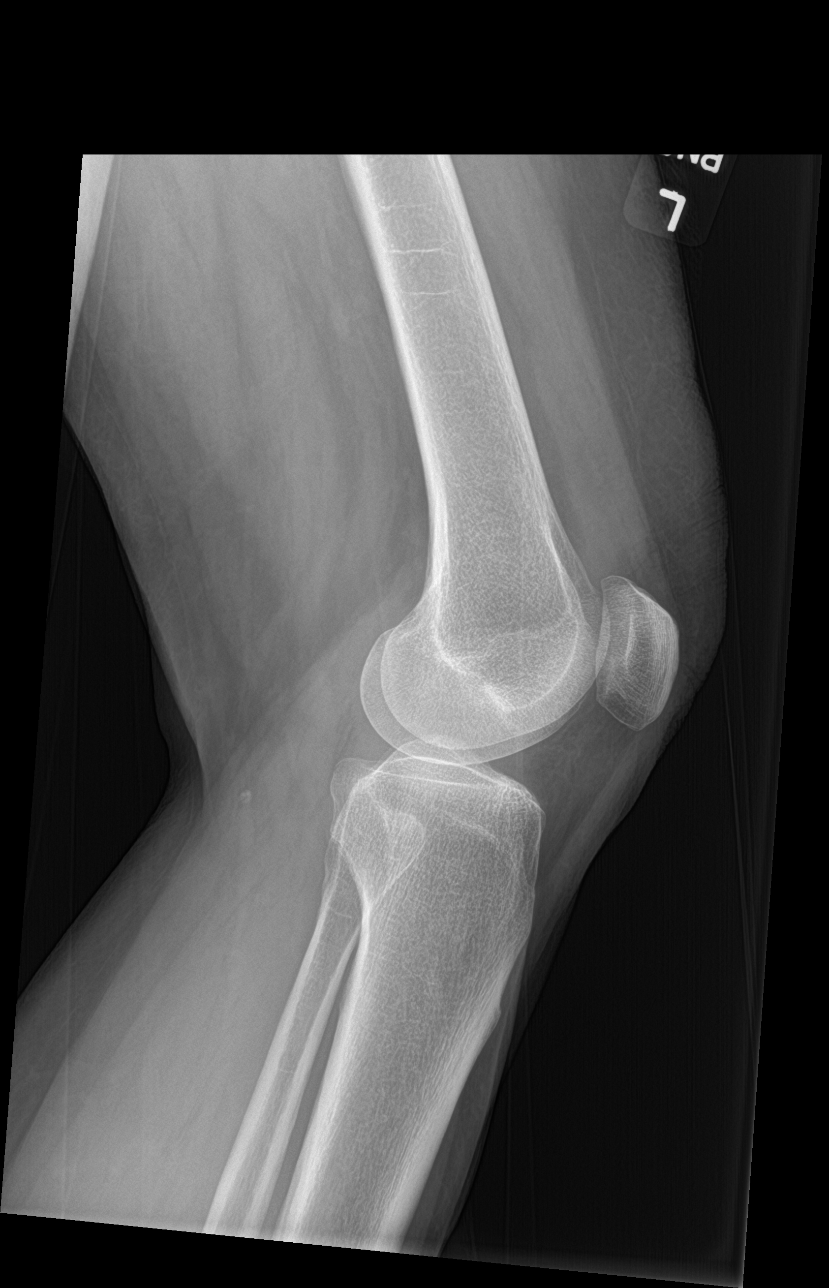

[sunrise]
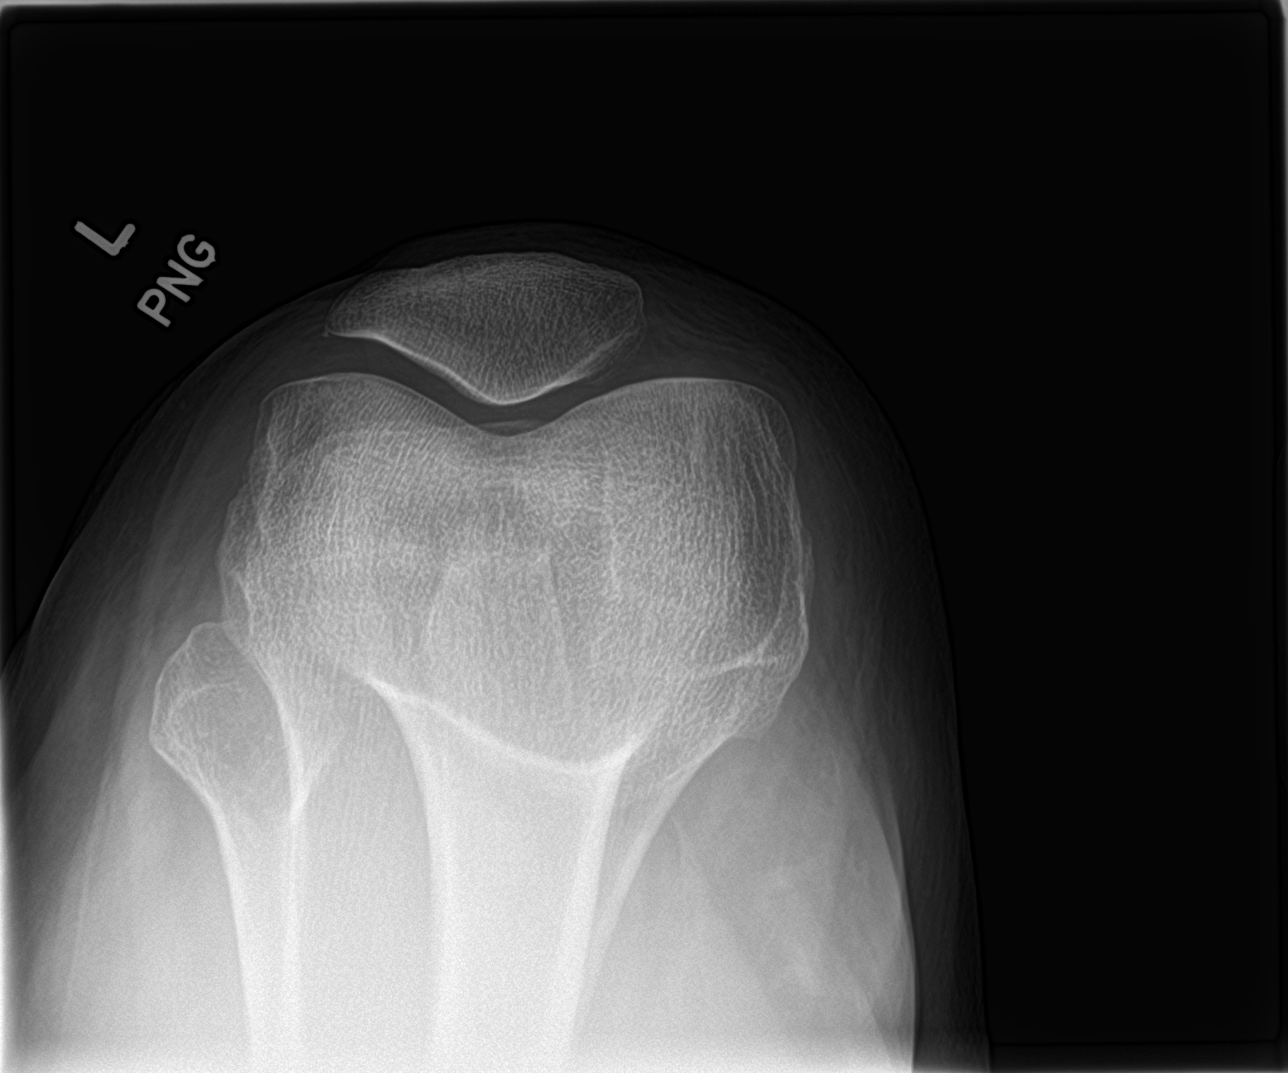

[4 of 4 positions shown; findings below may reference images not displayed]

FINDINGS: No fracture or malalignment. Mild medial joint space degenerative
changes. Small knee effusion
IMPRESSION: 1. No acute osseous abnormality
2. Small knee effusion

## 2019-09-14 NOTE — Assessment & Plan Note (Addendum)
Patient does have a tenderness still seems to have some mild displacement.  Patient also has still an effusion.  Patient has made significant progress.  Is feels like she is 93% better.  At this point we will continue with conservative therapy but I would like to get an x-ray downstairs.  Patient will continue to wear the brace.  If no improvement in 6 weeks I do feel an MRI is necessary to further evaluate and treatment options including arthroscopic surgical intervention versus the possibility of viscosupplementation.  Patient had declined x-rays previously but will get them today

## 2019-09-14 NOTE — Progress Notes (Signed)
[name] [place], [place] [address] Phone: [phone] Subjective:   Nicole [name], am serving as a scribe for Dr. Hulan Saas.  I'm seeing this patient by the request  of:    CC: Left knee pain  OHY:[id number]   08/03/2019 Patient feels like he is doing approximately 75% better with instability of the knee still noted.  Patient would not want to change any significant treatment at this moment but is encouraged to try formal physical therapy and will be referred today.  Patient will continue to wear the hinged brace.  Patient wants to avoid any type of surgical intervention and will hold at the moment advanced imaging consider not change our management.  Follow-up with me again in 6 weeks.  If [name] improvement on ultrasound and again shows inflammation and swelling or instability I would encourage patient to have advanced imaging dislocation is options.  Patient is in agreement with the plan.  Spent  25 minutes with patient face-to-face and had greater than 50% of counseling   Update 09/14/2019 Nicole [name] is a 73 y.o. female coming in with complaint of left knee pain. Patient states that she has gone to 3-4 PT sessions. Last week patient was at the beach and did not do a lot which helped to improve her pain. This week though her pain is increasing especially in the anterior portion of the knee. Stairs elicit sharp pain in the meniscus. Does feel as if she is progressing though. Did try to take tart cherry but is unable to take the amount recommended due to upset stomach. Now takes them throughout the day with food and water.     Past Medical History:  Diagnosis Date  . BRCA1 positive   . Breast cancer ([facility])    Left- Ductal ca SR-, Chemo (Magrinat) rad rx   . GERD (gastroesophageal reflux disease)   . H/O migraine   . HSV-2 (herpes simplex virus 2) infection   . Hypercholesteremia   . Rheumatoid factor positive    OA   had rheum consult   . Scoliosis   . Sprain of ankle 03/06/2015   revewiwed care exercise and fu if needed    . Tinnitus    Past Surgical History:  Procedure Laterality Date  . BREAST LUMPECTOMY    . LAPAROSCOPIC BILATERAL SALPINGO OOPHERECTOMY  2006   Dr. Joan Flores  . MASTECTOMY Bilateral 2006   Social History   Socioeconomic History  . Marital status: Married    Spouse name: Not on file  . Number of children: Not on file  . Years of education: Not on file  . Highest education level: Not on file  Occupational History  . Not on file  Social Needs  . Financial resource strain: Not on file  . Food insecurity    Worry: Not on file    Inability: Not on file  . Transportation needs    Medical: Not on file    Non-medical: Not on file  Tobacco Use  . Smoking status: Former Research scientist (life sciences)  . Smokeless tobacco: Never Used  . Tobacco comment: limited in college   Substance and Sexual Activity  . Alcohol use: Yes    Alcohol/week: 6.0 standard drinks    Types: 5 Glasses of wine, 1 Shots of liquor per week    Comment: one glass of wine on weekends  . Drug use: [name]  . Sexual activity: Not Currently    Birth control/protection: Post-menopausal  Lifestyle  . Physical activity    Days per week: Not on file    Minutes per session: Not on file  . Stress: Not on file  Relationships  . Social Herbalist on phone: Not on file    Gets together: Not on file    Attends religious service: Not on file    Active member of club or organization: Not on file    Attends meetings of clubs or organizations: Not on file    Relationship status: Not on file  Other Topics Concern  . Not on file  Social History Narrative   Married, household of 2. 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 Designated Party release signed 04/17/10.      Husband with final stage prostate cancer at this time.   hh of 2    Allergies  Allergen Reactions  . Sulfonamide Derivatives Nausea And Vomiting  . Latex Rash    Family History  Problem Relation Age of Onset  . Hypertension Mother   . Heart disease Mother   . Stroke Mother   . Arthritis Mother   . Heart attack Brother   . Arthritis Unknown        maternal side RA cousin  . Kidney disease Unknown        maternal side  . Diabetes Unknown        1st degree relative, paternal side  . Diabetes Father   . Lung cancer Father      Current Outpatient Medications (Cardiovascular):  .  lovastatin (MEVACOR) 20 MG tablet, TAKE ONE TABLET BY MOUTH ONE TIME DAILY      Current Outpatient Medications (Other):  .  acyclovir (ZOVIRAX) 200 MG capsule, TAKE TWO CAPSULES BY MOUTH THREE TIMES DAILY FOR OUTBREAK .  acyclovir ointment (ZOVIRAX) 5 %, Apply 1 application topically 3 (three) times daily as needed. Marland Kitchen  b complex vitamins tablet, Take 1 tablet by mouth daily. .  calcium carbonate (OS-CAL) 600 MG TABS, Take 600 mg by mouth 3 (three) times daily with meals.   .  famotidine (PEPCID) 20 MG tablet, Take 20 mg by mouth 2 (two) times daily. .  fish oil-omega-3 fatty acids 1000 MG capsule, Take 1 g by mouth daily.  .  Magnesium 300 MG CAPS, Take 1 capsule by mouth daily.  .  ranitidine (ZANTAC) 150 MG tablet, TAKE 1 TABLET BY MOUTH TWO TIMES A DAY .  VITAMIN A PO, Take by mouth daily. .  Vitamin D, Cholecalciferol, 1000 UNITS CAPS, Take by mouth.    Past medical history, social, surgical and family history all reviewed in electronic medical record.  [name] pertanent information unless stated regarding to the chief complaint.   Review of Systems:  [name] headache, visual changes, nausea, vomiting, diarrhea, constipation, dizziness, abdominal pain, skin rash, fevers, chills, night sweats, weight loss, swollen lymph nodes, body aches, joint swelling,, chest pain, shortness of breath, mood changes.   Objective  Blood pressure 132/84, pulse 76, height _0  (1.549 m), weight 136 lb (61.7 kg), last menstrual period 11/24/1997, SpO2 97 %.    General: [name]  apparent distress alert and oriented x3 mood and affect normal, dressed appropriately.  HEENT: Pupils equal, extraocular movements intact  Respiratory: Patient's speak in full sentences and does not appear short of breath  Cardiovascular: [name] lower extremity edema, non tender, [name] erythema  Skin: Warm dry intact with [name] signs of infection or rash on extremities or on axial skeleton.  Abdomen: Soft nontender  Neuro: Cranial nerves II through XII are intact, neurovascularly intact in all extremities with 2+ DTRs and 2+ pulses.  Lymph: [name] lymphadenopathy of posterior or anterior cervical chain or axillae bilaterally.  Gait mild antalgic MSK:  Non tender with full range of motion and good stability and symmetric strength and tone of shoulders, elbows, wrist, hip, and ankles bilaterally.  Left knee exam shows the patient does have actually an endpoint on the LCL.  Trace effusion none noted.  The patient does have crepitus of the patellofemoral with range of motion.  Patient does have a positive McMurray still tenderness over the lateral joint line. Contralateral knee unremarkable  Limited musculoskeletal ultrasound was performed and interpreted by Lyndal Pulley  Limited ultrasound of patient's left knee shows the patient does have a mild displaced meniscus on the outside of the knee.  LCL bone does show some healing from previous exam none.  Does have more scar tissue formation noted. Impression: Continued lateral meniscal tear with potential healing of LCL but continued knee effusion   Impression and Recommendations:     This case required medical decision making of moderate complexity. The above documentation has been reviewed and is accurate and complete Lyndal Pulley, DO       Note: This dictation was prepared with Dragon dictation along with smaller phrase technology. Any transcriptional errors that result from this process are unintentional.

## 2019-09-14 NOTE — Patient Instructions (Signed)
Continue with PT Xray downstairs See me in 6 weeks, if not better will consider MRI

## 2019-09-23 ENCOUNTER — Other Ambulatory Visit: Payer: Self-pay | Admitting: Internal Medicine

## 2019-10-25 ENCOUNTER — Ambulatory Visit: Payer: Medicare Other | Admitting: Family Medicine

## 2019-10-25 ENCOUNTER — Encounter: Payer: Self-pay | Admitting: Family Medicine

## 2019-10-25 ENCOUNTER — Other Ambulatory Visit: Payer: Self-pay

## 2019-10-25 VITALS — BP 100/70 | HR 75 | Ht 61.0 in

## 2019-10-25 DIAGNOSIS — S83422A Sprain of lateral collateral ligament of left knee, initial encounter: Secondary | ICD-10-CM | POA: Diagnosis not present

## 2019-10-25 DIAGNOSIS — G8929 Other chronic pain: Secondary | ICD-10-CM

## 2019-10-25 DIAGNOSIS — S83282A Other tear of lateral meniscus, current injury, left knee, initial encounter: Secondary | ICD-10-CM | POA: Diagnosis not present

## 2019-10-25 DIAGNOSIS — M25562 Pain in left knee: Secondary | ICD-10-CM | POA: Diagnosis not present

## 2019-10-25 NOTE — Progress Notes (Signed)
[name] [place],  [address] Phone: [phone] Subjective:   I Nicole [name] am serving as a Education administrator for Dr. Hulan Saas.  This visit occurred during the SARS-CoV-2 public health emergency.  Safety protocols were in place, including screening questions prior to the visit, additional usage of staff PPE, and extensive cleaning of exam room while observing appropriate contact time as indicated for disinfecting solutions.    CC: Left knee pain follow-up  [id number]   09/14/2019 Patient does have a tenderness still seems to have some mild displacement.  Patient also has still an effusion.  Patient has made significant progress.  Is feels like she is 93% better.  At this point we will continue with conservative therapy but I would like to get an x-ray downstairs.  Patient will continue to wear the brace.  If no improvement in 6 weeks I do feel an MRI is necessary to further evaluate and treatment options including arthroscopic surgical intervention versus the possibility of viscosupplementation.  Patient had declined x-rays previously but will get them today  Update 10/25/2019 Nicole [name] is a 73 y.o. female coming in with complaint of left knee pain. Patient states she feels the knee has gotten better. Still some swelling with activity.  Patient states and overall has felt significantly better but does not feel like she has the stability of the knee back.    Past Medical History:  Diagnosis Date  . BRCA1 positive   . Breast cancer ([facility])    Left- Ductal ca SR-, Chemo (Magrinat) rad rx   . GERD (gastroesophageal reflux disease)   . H/O migraine   . HSV-2 (herpes simplex virus 2) infection   . Hypercholesteremia   . Rheumatoid factor positive    OA   had rheum consult  . Scoliosis   . Sprain of ankle 03/06/2015   revewiwed care exercise and fu if needed    . Tinnitus    Past Surgical History:  Procedure Laterality Date  .  BREAST LUMPECTOMY    . LAPAROSCOPIC BILATERAL SALPINGO OOPHERECTOMY  2006   Dr. Joan Flores  . MASTECTOMY Bilateral 2006   Social History   Socioeconomic History  . Marital status: Married    Spouse name: Not on file  . Number of children: Not on file  . Years of education: Not on file  . Highest education level: Not on file  Occupational History  . Not on file  Social Needs  . Financial resource strain: Not on file  . Food insecurity    Worry: Not on file    Inability: Not on file  . Transportation needs    Medical: Not on file    Non-medical: Not on file  Tobacco Use  . Smoking status: Former Research scientist (life sciences)  . Smokeless tobacco: Never Used  . Tobacco comment: limited in college   Substance and Sexual Activity  . Alcohol use: Yes    Alcohol/week: 6.0 standard drinks    Types: 5 Glasses of wine, 1 Shots of liquor per week    Comment: one glass of wine on weekends  . Drug use: No  . Sexual activity: Not Currently    Birth control/protection: Post-menopausal  Lifestyle  . Physical activity    Days per week: Not on file    Minutes per session: Not on file  . Stress: Not on file  Relationships  . Social connections    Talks on phone: Not on file  Gets together: Not on file    Attends religious service: Not on file    Active member of club or organization: Not on file    Attends meetings of clubs or organizations: Not on file    Relationship status: Not on file  Other Topics Concern  . Not on file  Social History Narrative   Married, household of 2. 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 Designated Party release signed 04/17/10.      Husband with final stage prostate cancer at this time.   hh of 2    Allergies  Allergen Reactions  . Sulfonamide Derivatives Nausea And Vomiting  . Latex Rash   Family History  Problem Relation Age of Onset  . Hypertension Mother   . Heart disease Mother   . Stroke Mother   . Arthritis Mother   . Heart attack Brother    . Arthritis Unknown        maternal side RA cousin  . Kidney disease Unknown        maternal side  . Diabetes Unknown        1st degree relative, paternal side  . Diabetes Father   . Lung cancer Father      Current Outpatient Medications (Cardiovascular):  .  lovastatin (MEVACOR) 20 MG tablet, TAKE ONE TABLET BY MOUTH ONE TIME DAILY      Current Outpatient Medications (Other):  .  acyclovir (ZOVIRAX) 200 MG capsule, TAKE TWO CAPSULES BY MOUTH THREE TIMES DAILY FOR OUTBREAK .  acyclovir ointment (ZOVIRAX) 5 %, Apply 1 application topically 3 (three) times daily as needed. Marland Kitchen  b complex vitamins tablet, Take 1 tablet by mouth daily. .  calcium carbonate (OS-CAL) 600 MG TABS, Take 600 mg by mouth 3 (three) times daily with meals.   .  famotidine (PEPCID) 20 MG tablet, Take 20 mg by mouth 2 (two) times daily. .  fish oil-omega-3 fatty acids 1000 MG capsule, Take 1 g by mouth daily.  .  Magnesium 300 MG CAPS, Take 1 capsule by mouth daily.  .  ranitidine (ZANTAC) 150 MG tablet, TAKE 1 TABLET BY MOUTH TWO TIMES A DAY .  VITAMIN A PO, Take by mouth daily. .  Vitamin D, Cholecalciferol, 1000 UNITS CAPS, Take by mouth.    Past medical history, social, surgical and family history all reviewed in electronic medical record.  No pertanent information unless stated regarding to the chief complaint.   Review of Systems:  No headache, visual changes, nausea, vomiting, diarrhea, constipation, dizziness, abdominal pain, skin rash, fevers, chills, night sweats, weight loss, swollen lymph nodes,chest pain, shortness of breath, mood changes.  Positive muscle aches, joint swelling  Objective  Blood pressure 100/70, pulse 75, height _0  (1.549 m), last menstrual period 11/24/1997, SpO2 98 %.    General: No apparent distress alert and oriented x3 mood and affect normal, dressed appropriately.  HEENT: Pupils equal, extraocular movements intact  Respiratory: Patient's speak in full sentences and  does not appear short of breath  Cardiovascular: No lower extremity edema, non tender, no erythema  Skin: Warm dry intact with no signs of infection or rash on extremities or on axial skeleton.  Abdomen: Soft nontender  Neuro: Cranial nerves II through XII are intact, neurovascularly intact in all extremities with 2+ DTRs and 2+ pulses.  Lymph: No lymphadenopathy of posterior or anterior cervical chain or axillae bilaterally.  Gait antalgic MSK:  tender with limited range of motion and good stability and symmetric  strength and tone of shoulders, elbows, wrist, hip, and ankles bilaterally.    Left knee exam still has some swelling noted.  Positive McMurray's.  Patient does have some mild laxity of the LCL compared to the contralateral side.  Does have full range of motion of the knee which is an improvement    Impression and Recommendations:     This case required medical decision making of moderate complexity. The above documentation has been reviewed and is accurate and complete Lyndal Pulley, DO       Note: This dictation was prepared with Dragon dictation along with smaller phrase technology. Any transcriptional errors that result from this process are unintentional.

## 2019-10-25 NOTE — Assessment & Plan Note (Signed)
Patient on previous ultrasounds have had more of a lateral meniscal tear.  I do believe at this point and advanced imaging is warranted with patient having recurrent effusions of the joint.  Patient has some mild instability as well and would be at increased risk of a fall.  We will get the MRI to discuss today and if patient could be either a candidate for viscosupplementation for any underlying arthritis or the potential for home exercises and icing regimen.  Follow-up again in 4 weeks

## 2019-10-25 NOTE — Patient Instructions (Signed)
Good to see you MRI left knee Back exercises Follow up for back in 4-5 weeks

## 2019-10-26 ENCOUNTER — Ambulatory Visit: Payer: Medicare Other | Admitting: Family Medicine

## 2019-10-27 ENCOUNTER — Other Ambulatory Visit: Payer: Self-pay

## 2019-11-01 ENCOUNTER — Other Ambulatory Visit: Payer: Self-pay

## 2019-11-01 ENCOUNTER — Ambulatory Visit (INDEPENDENT_AMBULATORY_CARE_PROVIDER_SITE_OTHER): Payer: Medicare Other | Admitting: Obstetrics & Gynecology

## 2019-11-01 ENCOUNTER — Encounter: Payer: Self-pay | Admitting: Obstetrics & Gynecology

## 2019-11-01 ENCOUNTER — Encounter

## 2019-11-01 VITALS — BP 120/80 | HR 84 | Temp 95.8°F | Resp 12 | Ht 61.5 in | Wt 132.4 lb

## 2019-11-01 DIAGNOSIS — M8580 Other specified disorders of bone density and structure, unspecified site: Secondary | ICD-10-CM | POA: Diagnosis not present

## 2019-11-01 DIAGNOSIS — Z01419 Encounter for gynecological examination (general) (routine) without abnormal findings: Secondary | ICD-10-CM | POA: Diagnosis not present

## 2019-11-01 NOTE — Progress Notes (Signed)
73 y.o. [id number] Married White or Caucasian female here for annual exam.   Having left knee pain this year when she injured her LCL.  Has been followed by Dr. Tamala Julian.  MRI is scheduled.  She did PT.    Injury was in May.  Husband has end stage prostate cancer.  Denies vaginal bleeding.    Patient's last menstrual period was 11/24/1997 (approximate).          Sexually active: No.  The current method of family planning is post menopausal status.    Exercising: Yes.    walking Smoker:  Former  Health Maintenance: Pap:  07/05/18 Negative History of abnormal Pap:  Yes, remote hx MMG:  03/18/05 BIRADS2:Benign. Mastectomy done 08/11/05 Colonoscopy:  05/2011 normal. F/u 10 years  BMD:   10/14/16 Osteopenia, -1.7, -1.4 TDaP:  Tetanus 2015 Pneumonia vaccine(s):  completed Shingrix:   completed Hep C testing: 06/10/19 Negative Screening Labs: PCP   reports that she has quit smoking. She has never used smokeless tobacco. She reports current alcohol use of about 7.0 standard drinks of alcohol per week. She reports that she does not use drugs.  Past Medical History:  Diagnosis Date  . BRCA1 positive   . Breast cancer ([facility])    Left- Ductal ca SR-, Chemo (Magrinat) rad rx   . GERD (gastroesophageal reflux disease)   . H/O migraine   . HSV-2 (herpes simplex virus 2) infection   . Hypercholesteremia   . Rheumatoid factor positive    OA   had rheum consult  . Scoliosis   . Sprain of ankle 03/06/2015   revewiwed care exercise and fu if needed    . Tinnitus     Past Surgical History:  Procedure Laterality Date  . BREAST LUMPECTOMY    . LAPAROSCOPIC BILATERAL SALPINGO OOPHERECTOMY  2006   Dr. Joan Flores  . MASTECTOMY Bilateral 2006    Current Outpatient Medications  Medication Sig Dispense Refill  . acyclovir (ZOVIRAX) 200 MG capsule TAKE TWO CAPSULES BY MOUTH THREE TIMES DAILY FOR OUTBREAK 60 capsule 2  . acyclovir ointment (ZOVIRAX) 5 % Apply 1 application topically 3 (three) times daily as  needed. 30 g 6  . Ascorbic Acid (VITAMIN C) 500 MG CAPS     . b complex vitamins tablet Take 1 tablet by mouth daily.    . calcium carbonate (OS-CAL) 600 MG TABS Take 600 mg by mouth 3 (three) times daily with meals.      . famotidine (PEPCID) 20 MG tablet Take 20 mg by mouth 2 (two) times daily.    . fish oil-omega-3 fatty acids 1000 MG capsule Take 1 g by mouth daily.     Marland Kitchen lovastatin (MEVACOR) 20 MG tablet TAKE ONE TABLET BY MOUTH ONE TIME DAILY  90 tablet 0  . Magnesium 300 MG CAPS Take 1 capsule by mouth daily.     Marland Kitchen VITAMIN A PO Take by mouth daily.    . Vitamin D, Cholecalciferol, 1000 UNITS CAPS Take by mouth.     No current facility-administered medications for this visit.     Family History  Problem Relation Age of Onset  . Hypertension Mother   . Heart disease Mother   . Stroke Mother   . Arthritis Mother   . Heart attack Brother   . Arthritis Other        maternal side RA cousin  . Kidney disease Other        maternal side  . Diabetes Other  1st degree relative, paternal side  . Diabetes Father   . Lung cancer Father     Review of Systems  All other systems reviewed and are negative.   Exam:   BP 120/80 (BP Location: Left Arm, Patient Position: Sitting, Cuff Size: Normal)   Pulse 84   Temp (!) 95.8 F (35.4 C) (Temporal)   Resp 12   Ht 5' 1.5" (1.562 m)   Wt 132 lb 6.4 oz (60.1 kg)   LMP 11/24/1997 (Approximate)   BMI 24.61 kg/m   Height:   Height: 5' 1.5" (156.2 cm)  Ht Readings from Last 3 Encounters:  11/01/19 5' 1.5" (1.562 m)  10/25/19 5' 1"  (1.549 m)  09/14/19 5' 1"  (1.549 m)    General appearance: alert, cooperative and appears stated age Head: Normocephalic, without obvious abnormality, atraumatic Neck: no adenopathy, supple, symmetrical, trachea midline and thyroid normal to inspection and palpation Lungs: clear to auscultation bilaterally Breasts: surgically absent, no masses, no axillary masses Heart: regular rate and  rhythm Abdomen: soft, non-tender; bowel sounds normal; no masses,  no organomegaly Extremities: extremities normal, atraumatic, no cyanosis or edema Skin: Skin color, texture, turgor normal. No rashes or lesions Lymph nodes: Cervical, supraclavicular, and axillary nodes normal. No abnormal inguinal nodes palpated Neurologic: Grossly normal   Pelvic: External genitalia:  no lesions              Urethra:  normal appearing urethra with no masses, tenderness or lesions              Bartholins and Skenes: normal                 Vagina: normal appearing vagina with normal color and discharge, no lesions              Cervix: no lesions              Pap taken: No. Bimanual Exam:  Uterus:  normal size, contour, position, consistency, mobility, non-tender              Adnexa: normal adnexa and no mass, fullness, tenderness               Rectovaginal: Confirms               Anus:  normal sphincter tone, no lesions  Chaperone was present for exam.  A:  Well Woman with normal exam PMP, no HRT H/O BRCA 1 gene mutation S/p bilateral mastectomy and BSO in 2006 H/O HSV Hyperlipidemia  P:   Mammograms not indicated pap smear neg 2019.  Not indicated. Lab work UTD and done with Dr. Regis Bill Vaccines are UTD Return annually or prn

## 2019-11-07 ENCOUNTER — Other Ambulatory Visit: Payer: Self-pay | Admitting: Obstetrics & Gynecology

## 2019-11-07 DIAGNOSIS — M8580 Other specified disorders of bone density and structure, unspecified site: Secondary | ICD-10-CM

## 2019-11-16 ENCOUNTER — Ambulatory Visit
Admission: RE | Admit: 2019-11-16 | Discharge: 2019-11-16 | Disposition: A | Discharge status: Home / Self Care | Payer: Medicare Other | Source: Ambulatory Visit | Attending: Family Medicine | Admitting: Family Medicine

## 2019-11-16 ENCOUNTER — Other Ambulatory Visit: Payer: Self-pay

## 2019-11-16 DIAGNOSIS — G8929 Other chronic pain: Secondary | ICD-10-CM

## 2019-11-16 DIAGNOSIS — M25562 Pain in left knee: Secondary | ICD-10-CM

## 2019-11-16 IMAGING — MR MR KNEE*L* W/O CM
4 of 6 series · 22 of 40 positions shown · non-contrast
Comparison: Radiographs dated [DATE]

CLINICAL DATA: Chronic left knee pain.

EXAM:
MRI OF THE LEFT KNEE WITHOUT CONTRAST
TECHNIQUE: Multiplanar, multisequence MR imaging of the knee was performed. No
intravenous contrast was administered.

[Series 3: T2 fat-sat · axial · 4.0mm · 0.50mm/px · z∈[-91,+14]mm · 5 of 27 slices shown (1 of 2)]
[im 1/27]
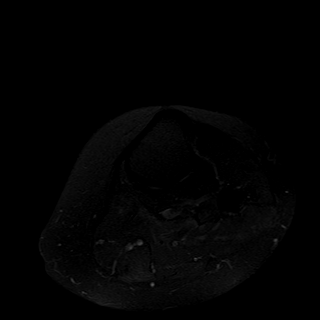
[im 5/27]
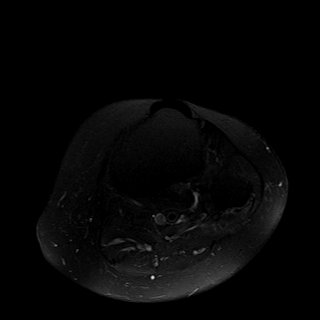
[im 9/27]
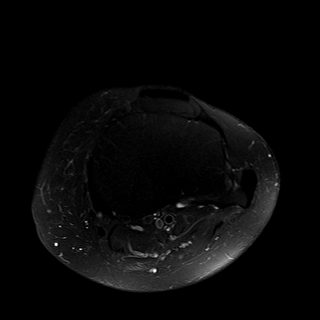
[im 14/27]
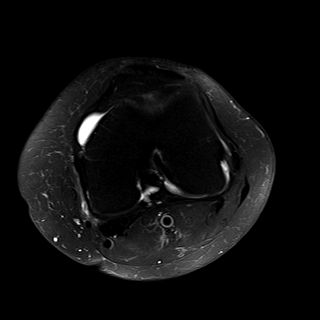
[im 22/27]
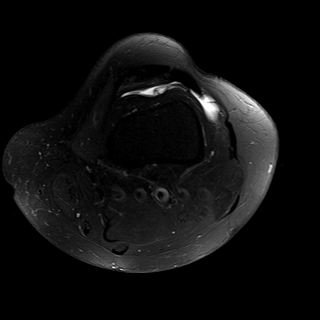

[Series 5: T2 fat-sat · coronal · 4.0mm · 0.29mm/px · 3 of 24 slices shown (2 of 2)]
[im 5/24]
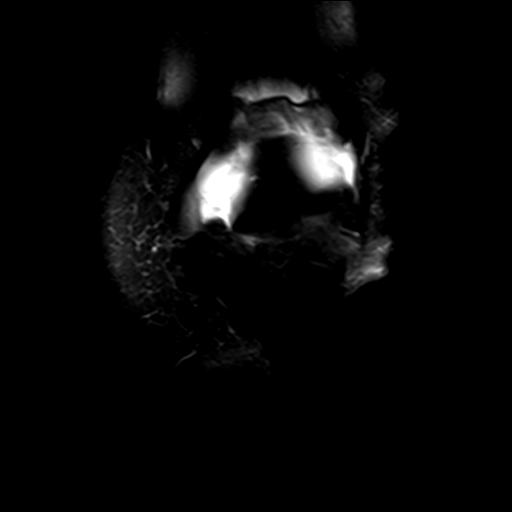
[im 14/24]
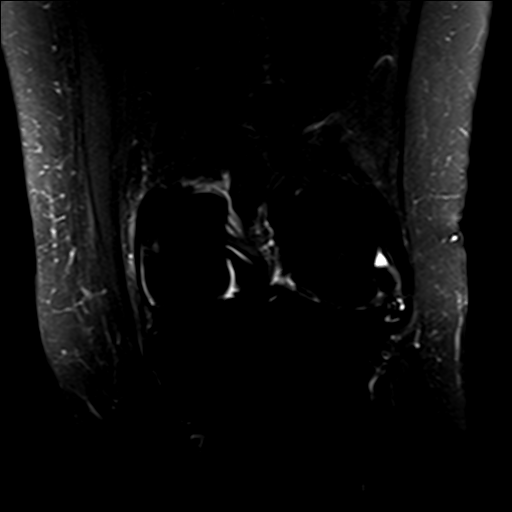
[im 24/24]
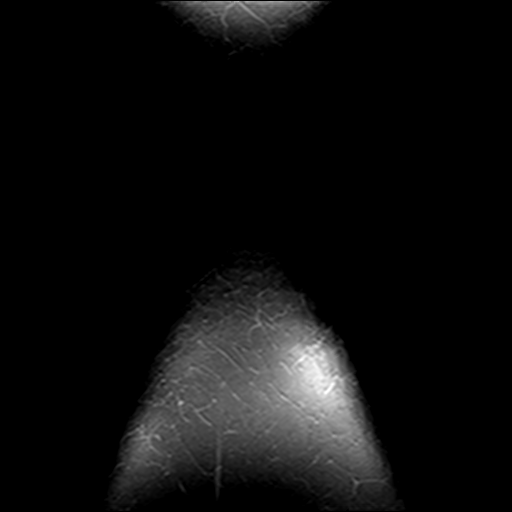

[Series 6: PD fat-sat · coronal · 3.0mm · 0.29mm/px · 7 of 30 slices shown (1 of 2)]
[im 1/30]
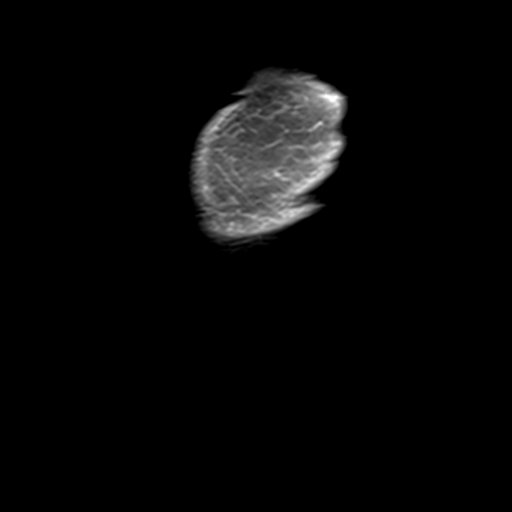
[im 5/30]
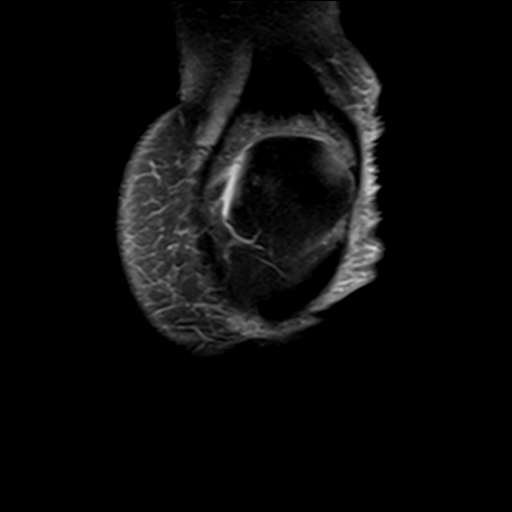
[im 10/30]
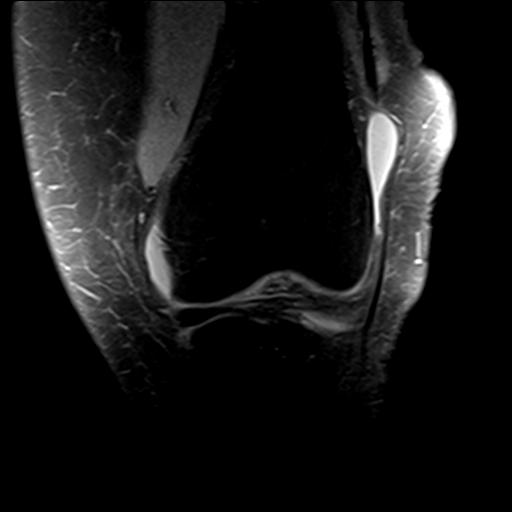
[im 15/30]
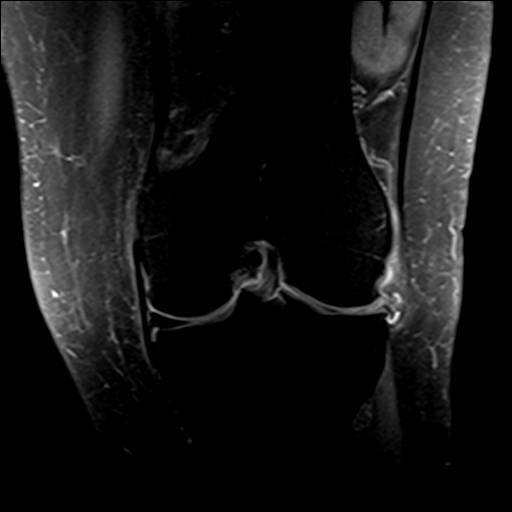
[im 20/30]
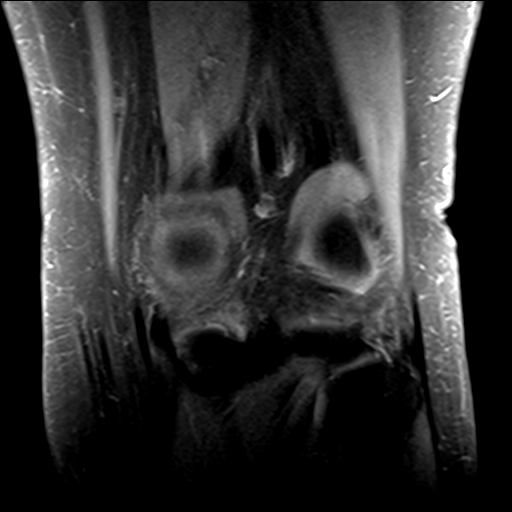
[im 25/30]
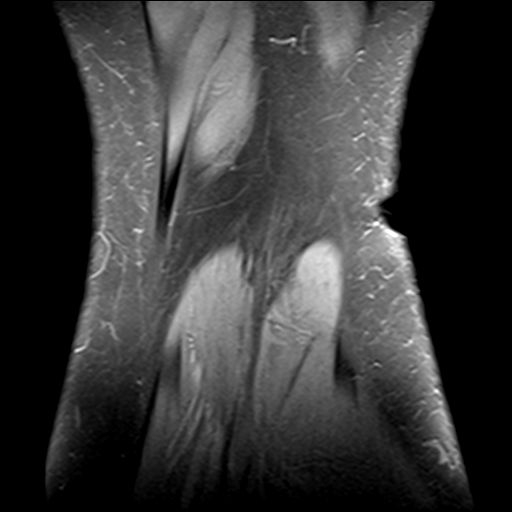
[im 30/30]
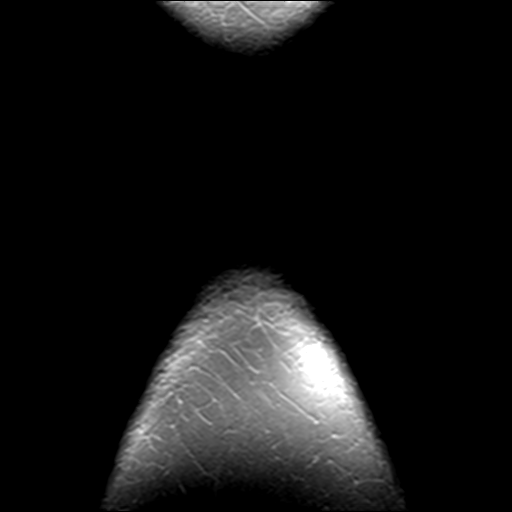

[Series 8: PD fat-sat · sagittal · 3.0mm · 0.29mm/px · 7 of 28 slices shown (2 of 2)]
[im 1/28]
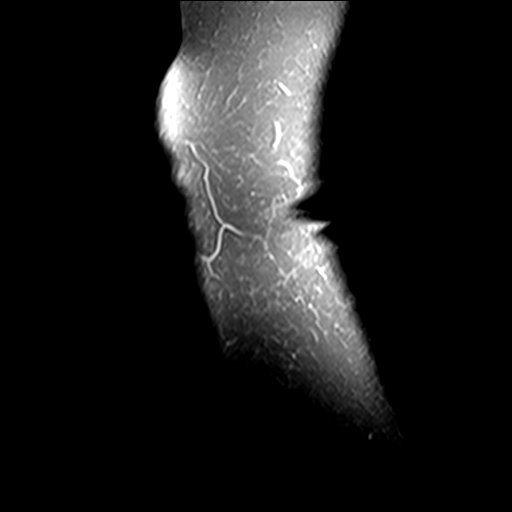
[im 5/28]
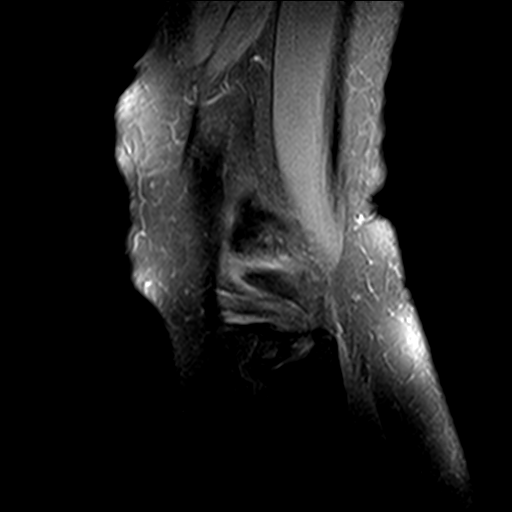
[im 10/28]
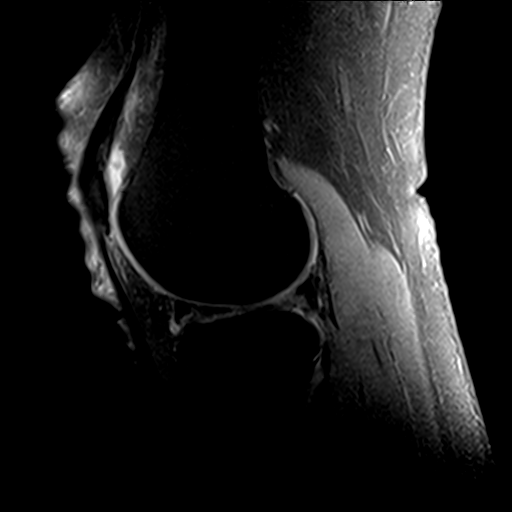
[im 14/28]
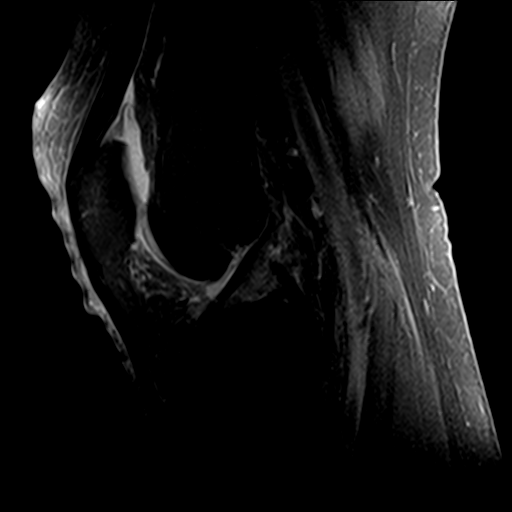
[im 19/28]
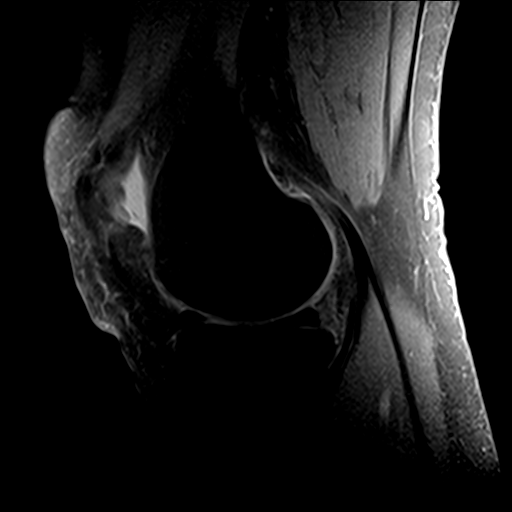
[im 23/28]
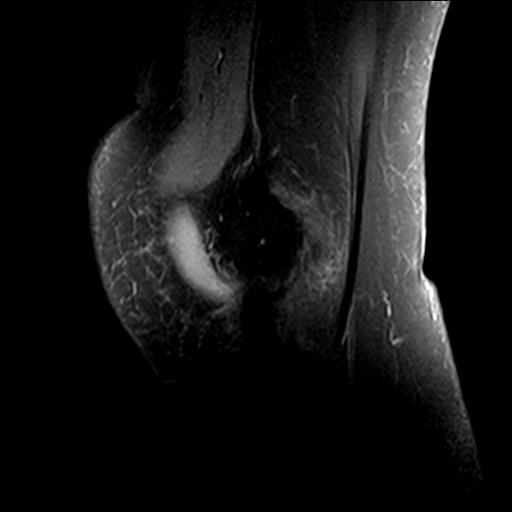
[im 28/28]
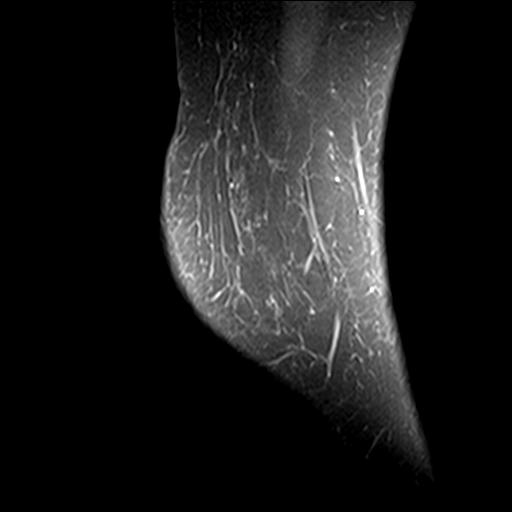

[22 of 40 positions shown; findings below may reference images not displayed]

FINDINGS: MENISCI

Medial meniscus:  Normal.

Lateral meniscus: There is a complex tear involving the entire
lateral meniscus. The midbody is basically missing and I think it
may be displaced adjacent to the anterior horn. The posterior horn
remnant is quite diminutive. There is some degenerated meniscal
tissue subluxed into the inferior gutter seen on image 14 of series
6.

LIGAMENTS

Cruciates:  Normal.

Collaterals:  Normal.

CARTILAGE

Patellofemoral:  No chondral defect.

Medial:  Normal.

Lateral:  Slight diffuse thinning of the articular cartilage.

Joint: Small joint effusion. Normal Hoffa's fat pad. No plical
thickening.

Popliteal Fossa:  No Baker cyst. Intact popliteus tendon.

Extensor Mechanism:  Normal.

Bones: Tiny marginal osteophyte on the periphery of the lateral
tibial plateau. Otherwise normal.

Other: None
IMPRESSION: 1. Complex tear involving the entire lateral meniscus. The midbody
is basically missing and I think it may be displaced adjacent to the
anterior horn.
2. Slight thinning of the articular cartilage in the lateral
compartment.
3. Small joint effusion.

## 2019-11-22 ENCOUNTER — Ambulatory Visit: Payer: Medicare Other | Admitting: Family Medicine

## 2019-11-23 ENCOUNTER — Encounter: Payer: Self-pay | Admitting: Family Medicine

## 2019-11-23 ENCOUNTER — Other Ambulatory Visit: Payer: Self-pay

## 2019-11-23 ENCOUNTER — Ambulatory Visit: Payer: Medicare Other | Admitting: Family Medicine

## 2019-11-23 DIAGNOSIS — S83282A Other tear of lateral meniscus, current injury, left knee, initial encounter: Secondary | ICD-10-CM | POA: Diagnosis not present

## 2019-11-23 NOTE — Assessment & Plan Note (Signed)
Discussed with patient in great length.  We went over the MRI in great detail.  We discussed the different treatment options including steroid injections, viscosupplementation, formal physical therapy again, custom bracing, as well as possible surgical intervention.  Patient has elected to continue with conservative therapy.  Encourage patient to avoid twisting motions when possible.  We discussed wearing a brace when doing things on his son even surfaces.  Patient is going to continue with the vitamin supplementations.  Patient will follow up with me again in 6 to 8 weeks as long as she is doing well.  Otherwise can follow-up as needed.  Spent  30 minutes with patient face-to-face and had greater than 50% of counseling including as described above in assessment and plan.

## 2019-11-23 NOTE — Progress Notes (Signed)
[facility] [address] [place] Phone: [phone] Subjective:   I Nicole [name] am serving as a Education administrator for Dr. Hulan Saas.  This visit occurred during the SARS-CoV-2 public health emergency.  Safety protocols were in place, including screening questions prior to the visit, additional usage of staff PPE, and extensive cleaning of exam room while observing appropriate contact time as indicated for disinfecting solutions.     CC: Left knee pain follow-up  [id number]   10/25/2019 Patient on previous ultrasounds have had more of a lateral meniscal tear.  I do believe at this point and advanced imaging is warranted with patient having recurrent effusions of the joint.  Patient has some mild instability as well and would be at increased risk of a fall.  We will get the MRI to discuss today and if patient could be either a candidate for viscosupplementation for any underlying arthritis or the potential for home exercises and icing regimen.  Follow-up again in 4 weeks  11/23/2019 Nicole [name] is a 73 y.o. female coming in with complaint of left knee. Had MRI. Knee gets sore. Had a collision with her dish washer. Left leg.  Patient is a natural complex meniscal tear that was displaced this was independently visualized by me.  Patient states that she has not had any locking sensation for the last.  Patient was significantly continue with potential conservative therapy and follow well.  Patient states that there is intermittent swelling from time to time.      Past Medical History:  Diagnosis Date  . BRCA1 positive   . Breast cancer ([place])    Left- Ductal ca SR-, Chemo ([facility]) rad rx   . GERD (gastroesophageal reflux disease)   . H/O migraine   . HSV-2 (herpes simplex virus 2) infection   . Hypercholesteremia   . Rheumatoid factor positive    OA   had rheum consult  . Scoliosis   . Sprain of ankle 03/06/2015   revewiwed care  exercise and fu if needed    . Tinnitus    Past Surgical History:  Procedure Laterality Date  . BREAST LUMPECTOMY    . LAPAROSCOPIC BILATERAL SALPINGO OOPHERECTOMY  2006   Dr. Joan Flores  . MASTECTOMY Bilateral 2006   Social History   Socioeconomic History  . Marital status: Married    Spouse name: Not on file  . Number of children: Not on file  . Years of education: Not on file  . Highest education level: Not on file  Occupational History  . Not on file  Tobacco Use  . Smoking status: Former Research scientist (life sciences)  . Smokeless tobacco: Never Used  . Tobacco comment: limited in college   Substance and Sexual Activity  . Alcohol use: Yes    Alcohol/week: 7.0 standard drinks    Types: 7 Glasses of wine per week  . Drug use: No  . Sexual activity: Not Currently    Birth control/protection: Post-menopausal  Other Topics Concern  . Not on file  Social History Narrative   Married, household of 2. 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 Designated Party release signed 04/17/10.      Husband with final stage prostate cancer at this time.   hh of 2    Social Determinants of Health   Financial Resource Strain:   . Difficulty of Paying Living Expenses: Not on file  Food Insecurity:   . Worried About Charity fundraiser in the  Last Year: Not on file  . Ran Out of Food in the Last Year: Not on file  Transportation Needs:   . Lack of Transportation (Medical): Not on file  . Lack of Transportation (Non-Medical): Not on file  Physical Activity:   . Days of Exercise per Week: Not on file  . Minutes of Exercise per Session: Not on file  Stress:   . Feeling of Stress : Not on file  Social Connections:   . Frequency of Communication with Friends and Family: Not on file  . Frequency of Social Gatherings with Friends and Family: Not on file  . Attends Religious Services: Not on file  . Active Member of Clubs or Organizations: Not on file  . Attends Archivist Meetings:  Not on file  . Marital Status: Not on file   Allergies  Allergen Reactions  . Sulfonamide Derivatives Nausea And Vomiting  . Latex Rash   Family History  Problem Relation Age of Onset  . Hypertension Mother   . Heart disease Mother   . Stroke Mother   . Arthritis Mother   . Heart attack Brother   . Arthritis Other        maternal side RA cousin  . Kidney disease Other        maternal side  . Diabetes Other        1st degree relative, paternal side  . Diabetes Father   . Lung cancer Father      Current Outpatient Medications (Cardiovascular):  .  lovastatin (MEVACOR) 20 MG tablet, TAKE ONE TABLET BY MOUTH ONE TIME DAILY      Current Outpatient Medications (Other):  .  acyclovir (ZOVIRAX) 200 MG capsule, TAKE TWO CAPSULES BY MOUTH THREE TIMES DAILY FOR OUTBREAK .  acyclovir ointment (ZOVIRAX) 5 %, Apply 1 application topically 3 (three) times daily as needed. .  Ascorbic Acid (VITAMIN C) 500 MG CAPS,  .  b complex vitamins tablet, Take 1 tablet by mouth daily. .  calcium carbonate (OS-CAL) 600 MG TABS, Take 600 mg by mouth 3 (three) times daily with meals.   .  famotidine (PEPCID) 20 MG tablet, Take 20 mg by mouth 2 (two) times daily. .  fish oil-omega-3 fatty acids 1000 MG capsule, Take 1 g by mouth daily.  .  Magnesium 300 MG CAPS, Take 1 capsule by mouth daily.  Marland Kitchen  VITAMIN A PO, Take by mouth daily. .  Vitamin D, Cholecalciferol, 1000 UNITS CAPS, Take by mouth.    Past medical history, social, surgical and family history all reviewed in electronic medical record.  No pertanent information unless stated regarding to the chief complaint.   Review of Systems:  No headache, visual changes, nausea, vomiting, diarrhea, constipation, dizziness, abdominal pain, skin rash, fevers, chills, night sweats, weight loss, swollen lymph nodes, body aches, joint swelling, muscle aches, chest pain, shortness of breath, mood changes.   Objective  Blood pressure 130/80, pulse 96,  height 5' 1.5" (1.562 m), weight 132 lb (59.9 kg), last menstrual period 11/24/1997, SpO2 97 %.    General: No apparent distress alert and oriented x3 mood and affect normal, dressed appropriately.  HEENT: Pupils equal, extraocular movements intact  Respiratory: Patient's speak in full sentences and does not appear short of breath  Cardiovascular: No lower extremity edema, non tender, no erythema  Skin: Warm dry intact with no signs of infection or rash on extremities or on axial skeleton.  Abdomen: Soft nontender  Neuro: Cranial nerves II through  XII are intact, neurovascularly intact in all extremities with 2+ DTRs and 2+ pulses.  Lymph: No lymphadenopathy of posterior or anterior cervical chain or axillae bilaterally.  Gait antalgic MSK:  tender with full range of motion and good stability and symmetric strength and tone of shoulders, elbows, wrist, hip, and ankles bilaterally.    Left knee exam does have some mild arthritic changes.  Still tender to palpation over the medial and lateral joint space.  Patient has no significant instability but positive McMurray still noted.  Neurovascularly intact distally. Impression and Recommendations:     This case required medical decision making of moderate complexity. The above documentation has been reviewed and is accurate and complete Lyndal Pulley, DO       Note: This dictation was prepared with Dragon dictation along with smaller phrase technology. Any transcriptional errors that result from this process are unintentional.

## 2019-11-23 NOTE — Patient Instructions (Signed)
Keep doing the exercises Get CT scan and drop it off See me again in 8 weeks just in case

## 2019-12-30 ENCOUNTER — Other Ambulatory Visit: Payer: Self-pay

## 2019-12-30 ENCOUNTER — Ambulatory Visit: Payer: Medicare PPO | Attending: Internal Medicine

## 2019-12-30 DIAGNOSIS — Z23 Encounter for immunization: Secondary | ICD-10-CM | POA: Insufficient documentation

## 2019-12-30 NOTE — Progress Notes (Signed)
   Covid-19 Vaccination Clinic  Name:  Nicole [name]    MRN: [id number] DOB: [birth year]  12/30/2019  [name] was observed post Covid-19 immunization for 15 minutes without incidence. She was provided with Vaccine Information Sheet and instruction to access the V-Safe system.   [name] was instructed to call 911 with any severe reactions post vaccine: Marland Kitchen Difficulty breathing  . Swelling of your face and throat  . A fast heartbeat  . A bad rash all over your body  . Dizziness and weakness    Immunizations Administered    Name Date Dose VIS Date Route   Moderna COVID-19 Vaccine 12/30/2019  4:44 PM 0.5 mL 10/25/2019 Intramuscular   Manufacturer: Moderna   Lot: [id number]   [place]:[id number]

## 2020-01-10 NOTE — Telephone Encounter (Signed)
Message sent

## 2020-01-15 ENCOUNTER — Other Ambulatory Visit: Payer: Self-pay | Admitting: Internal Medicine

## 2020-01-18 ENCOUNTER — Other Ambulatory Visit: Payer: Self-pay

## 2020-01-18 ENCOUNTER — Encounter: Payer: Self-pay | Admitting: Family Medicine

## 2020-01-18 ENCOUNTER — Ambulatory Visit: Payer: Medicare PPO | Admitting: Family Medicine

## 2020-01-18 DIAGNOSIS — M5441 Lumbago with sciatica, right side: Secondary | ICD-10-CM

## 2020-01-18 DIAGNOSIS — G8929 Other chronic pain: Secondary | ICD-10-CM | POA: Diagnosis not present

## 2020-01-18 DIAGNOSIS — S83282A Other tear of lateral meniscus, current injury, left knee, initial encounter: Secondary | ICD-10-CM

## 2020-01-18 NOTE — Patient Instructions (Signed)
See me again in 2 months °

## 2020-01-18 NOTE — Assessment & Plan Note (Signed)
Chronic problem.  Does have some spinal stenosis and some nerve impingement.  Discussed which activities to do which wants to avoid.  Discussed different medications which patient wants to avoid at the moment.  Worsening pain will consider the possibility injections and nerve root injections that I think will be beneficial.  Follow-up again in 4 to 8 weeks

## 2020-01-18 NOTE — Progress Notes (Signed)
Nicole [name] [place] Phone: [phone] Subjective:   Nicole [name], am serving as a scribe for Dr. Hulan Saas. This visit occurred during the SARS-CoV-2 public health emergency.  Safety protocols were in place, including screening questions prior to the visit, additional usage of staff PPE, and extensive cleaning of exam room while observing appropriate contact time as indicated for disinfecting solutions.   I'm seeing this patient by the request  of:  [name], [name], MD  CC: Left knee pain follow-up, back pain  [id number]   11/23/2019 Discussed with patient in great length.  We went over the MRI in great detail.  We discussed the different treatment options including steroid injections, viscosupplementation, formal physical therapy again, custom bracing, as well as possible surgical intervention.  Patient has elected to continue with conservative therapy.  Encourage patient to avoid twisting motions when possible.  We discussed wearing a brace when doing things on his son even surfaces.  Patient is going to continue with the vitamin supplementations.  Patient will follow up with me again in 6 to 8 weeks as long as she is doing well.  Otherwise can follow-up as needed.  Spent  30 minutes with patient face-to-face and had greater than 50% of counseling including as described above in assessment and plan.  Update 01/18/2020 Nicole [name] is a 74 y.o. female coming in with complaint of left knee pain. Has been able to dance. Is aware of movements that cause her pain. Is wearing hinged knee brace for activity. Does feel she is improving.   Patient is also here to talk about lower back pain from "double scoliosis." Majority of her pain is in lower thoracic and upper lumber. Pain can be sharp and achy. Does HEP to help with pain. Pain with lifting items around the house and after activity.  Patient brought in disc for her  outpatient MRIs.  These were independently visualized by me.  Patient does have a right-sided L5 nerve root impingement with facet arthropathy.  Scoliosis is noted with degenerative disc disease.       Past Medical History:  Diagnosis Date  . BRCA1 positive   . Breast cancer ([place])    Left- Ductal ca SR-, Chemo ([facility]) rad rx   . GERD (gastroesophageal reflux disease)   . H/O migraine   . HSV-2 (herpes simplex virus 2) infection   . Hypercholesteremia   . Rheumatoid factor positive    OA   had rheum consult  . Scoliosis   . Sprain of ankle 03/06/2015   revewiwed care exercise and fu if needed    . Tinnitus    Past Surgical History:  Procedure Laterality Date  . BREAST LUMPECTOMY    . LAPAROSCOPIC BILATERAL SALPINGO OOPHERECTOMY  2006   Dr. Joan Flores  . MASTECTOMY Bilateral 2006   Social History   Socioeconomic History  . Marital status: Married    Spouse name: Not on file  . Number of children: Not on file  . Years of education: Not on file  . Highest education level: Not on file  Occupational History  . Not on file  Tobacco Use  . Smoking status: Former Research scientist (life sciences)  . Smokeless tobacco: Never Used  . Tobacco comment: limited in college   Substance and Sexual Activity  . Alcohol use: Yes    Alcohol/week: 7.0 standard drinks    Types: 7 Glasses of wine per week  . Drug use: [name]  .  Sexual activity: Not Currently    Birth control/protection: Post-menopausal  Other Topics Concern  . Not on file  Social History Narrative   Married, household of 2. 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 Designated Party release signed 04/17/10.      Husband with final stage prostate cancer at this time.   hh of 2    Social Determinants of Health   Financial Resource Strain:   . Difficulty of Paying Living Expenses: Not on file  Food Insecurity:   . Worried About Charity fundraiser in the Last Year: Not on file  . Ran Out of Food in the Last Year: Not on file    Transportation Needs:   . Lack of Transportation (Medical): Not on file  . Lack of Transportation (Non-Medical): Not on file  Physical Activity:   . Days of Exercise per Week: Not on file  . Minutes of Exercise per Session: Not on file  Stress:   . Feeling of Stress : Not on file  Social Connections:   . Frequency of Communication with Friends and Family: Not on file  . Frequency of Social Gatherings with Friends and Family: Not on file  . Attends Religious Services: Not on file  . Active Member of Clubs or Organizations: Not on file  . Attends Archivist Meetings: Not on file  . Marital Status: Not on file   Allergies  Allergen Reactions  . Sulfonamide Derivatives Nausea And Vomiting  . Latex Rash   Family History  Problem Relation Age of Onset  . Hypertension Mother   . Heart disease Mother   . Stroke Mother   . Arthritis Mother   . Heart attack Brother   . Arthritis Other        maternal side RA cousin  . Kidney disease Other        maternal side  . Diabetes Other        1st degree relative, paternal side  . Diabetes Father   . Lung cancer Father      Current Outpatient Medications (Cardiovascular):  .  lovastatin (MEVACOR) 20 MG tablet, TAKE ONE TABLET BY MOUTH ONE TIME DAILY      Current Outpatient Medications (Other):  .  acyclovir (ZOVIRAX) 200 MG capsule, TAKE TWO CAPSULES BY MOUTH THREE TIMES DAILY FOR OUTBREAK .  acyclovir ointment (ZOVIRAX) 5 %, Apply 1 application topically 3 (three) times daily as needed. .  Ascorbic Acid (VITAMIN C) 500 MG CAPS,  .  b complex vitamins tablet, Take 1 tablet by mouth daily. .  calcium carbonate (OS-CAL) 600 MG TABS, Take 600 mg by mouth 3 (three) times daily with meals.   .  famotidine (PEPCID) 20 MG tablet, Take 20 mg by mouth 2 (two) times daily. .  fish oil-omega-3 fatty acids 1000 MG capsule, Take 1 g by mouth daily.  .  Magnesium 300 MG CAPS, Take 1 capsule by mouth daily.  Marland Kitchen  VITAMIN A PO, Take by  mouth daily. .  Vitamin D, Cholecalciferol, 1000 UNITS CAPS, Take by mouth.   Reviewed prior external information including notes and imaging from  primary care provider As well as notes that were available from care everywhere and other healthcare systems.  Past medical history, social, surgical and family history all reviewed in electronic medical record.  [name] pertanent information unless stated regarding to the chief complaint.   Review of Systems:  [name] headache, visual changes, nausea, vomiting, diarrhea, constipation, dizziness, abdominal  pain, skin rash, fevers, chills, night sweats, weight loss, swollen lymph nodes, body aches, joint swelling, chest pain, shortness of breath, mood changes. POSITIVE muscle aches  Objective  Blood pressure 116/84, pulse 82, height 5' 1.5" (1.562 m), weight 132 lb (59.9 kg), last menstrual period 11/24/1997, SpO2 99 %.   General: [name] apparent distress alert and oriented x3 mood and affect normal, dressed appropriately.  HEENT: Pupils equal, extraocular movements intact  Respiratory: Patient's speak in full sentences and does not appear short of breath  Cardiovascular: [name] lower extremity edema, non tender, [name] erythema  Skin: Warm dry intact with [name] signs of infection or rash on extremities or on axial skeleton.  Abdomen: Soft nontender  Neuro: Cranial nerves II through XII are intact, neurovascularly intact in all extremities with 2+ DTRs and 2+ pulses.  Lymph: [name] lymphadenopathy of posterior or anterior cervical chain or axillae bilaterally.  Gait mild antalgic MSK:  tender with full range of motion and good stability and symmetric strength and tone of shoulders, elbows, wrist, hip and ankles bilaterally.  Left knee does have some tenderness to palpation over the medial joint line.  Positive McMurray's noted.  Patient does have some limited range of motion with patellar grinding noted.  Abnormal thigh to calf ratio noted.  Seems to be more of the lateral  joint space more tender though.  Back exam does have some scoliosis noted.  Tender to palpation more on the right side of the paraspinal musculature.  Worsening pain with extension.  Deep tendon reflexes intact.  4+ out of 5 strength of the right lower extremity compared to the contralateral side.  [id number]; 15 additional minutes spent for Therapeutic exercises as stated in above notes.  This included exercises focusing on stretching, strengthening, with significant focus on eccentric aspects.   Long term goals include an improvement in range of motion, strength, endurance as well as avoiding reinjury. Patient's frequency would include in 1-2 times a day, 3-5 times a week for a duration of 6-12 weeks. Low back exercises that included:  Pelvic tilt/bracing instruction to focus on control of the pelvic girdle and lower abdominal muscles  Glute strengthening exercises, focusing on proper firing of the glutes without engaging the low back muscles Proper stretching techniques for maximum relief for the hamstrings, hip flexors, low back and some rotation where tolerated   Proper technique shown and discussed handout in great detail with ATC.  All questions were discussed and answered.     Impression and Recommendations:     This case required medical decision making of moderate complexity. The above documentation has been reviewed and is accurate and complete Lyndal Pulley, DO       Note: This dictation was prepared with Dragon dictation along with smaller phrase technology. Any transcriptional errors that result from this process are unintentional.

## 2020-01-18 NOTE — Assessment & Plan Note (Signed)
Discussed with patient in great length.  Seems to be doing relatively well.  No significant changes at this time.  Underlying arthritic changes.  Discussed other bracing in a lateral unloader with patient's arthritis pain mostly laterally.  Follow-up again in 4 to 8 weeks

## 2020-01-31 ENCOUNTER — Ambulatory Visit: Payer: Medicare PPO | Attending: Internal Medicine

## 2020-01-31 DIAGNOSIS — Z23 Encounter for immunization: Secondary | ICD-10-CM

## 2020-01-31 NOTE — Progress Notes (Signed)
   Covid-19 Vaccination Clinic  Name:  Nicole [name]    MRN: [id number] DOB: 1946/07/06  01/31/2020  [name] was observed post Covid-19 immunization for 15 minutes without incident. She was provided with Vaccine Information Sheet and instruction to access the V-Safe system.   [name] was instructed to call 911 with any severe reactions post vaccine: Marland Kitchen Difficulty breathing  . Swelling of face and throat  . A fast heartbeat  . A bad rash all over body  . Dizziness and weakness   Immunizations Administered    Name Date Dose VIS Date Route   Moderna COVID-19 Vaccine 01/31/2020  3:55 PM 0.5 mL 10/25/2019 Intramuscular   Manufacturer: Moderna   Lot: [id number]   [place]:[id number]

## 2020-02-06 ENCOUNTER — Other Ambulatory Visit: Payer: Self-pay

## 2020-02-06 ENCOUNTER — Ambulatory Visit
Admission: RE | Admit: 2020-02-06 | Discharge: 2020-02-06 | Disposition: A | Discharge status: Home / Self Care | Payer: Medicare PPO | Source: Ambulatory Visit | Attending: Obstetrics & Gynecology | Admitting: Obstetrics & Gynecology

## 2020-02-06 DIAGNOSIS — M8589 Other specified disorders of bone density and structure, multiple sites: Secondary | ICD-10-CM | POA: Diagnosis not present

## 2020-02-06 DIAGNOSIS — M8580 Other specified disorders of bone density and structure, unspecified site: Secondary | ICD-10-CM

## 2020-02-06 DIAGNOSIS — Z780 Asymptomatic menopausal state: Secondary | ICD-10-CM | POA: Diagnosis not present

## 2020-02-29 ENCOUNTER — Other Ambulatory Visit: Payer: Self-pay | Admitting: Internal Medicine

## 2020-03-22 ENCOUNTER — Ambulatory Visit: Payer: Medicare PPO | Admitting: Family Medicine

## 2020-04-16 ENCOUNTER — Other Ambulatory Visit: Payer: Self-pay | Admitting: Internal Medicine

## 2020-04-17 DIAGNOSIS — L814 Other melanin hyperpigmentation: Secondary | ICD-10-CM | POA: Diagnosis not present

## 2020-04-17 DIAGNOSIS — L570 Actinic keratosis: Secondary | ICD-10-CM | POA: Diagnosis not present

## 2020-04-17 DIAGNOSIS — D2272 Melanocytic nevi of left lower limb, including hip: Secondary | ICD-10-CM | POA: Diagnosis not present

## 2020-04-17 DIAGNOSIS — L819 Disorder of pigmentation, unspecified: Secondary | ICD-10-CM | POA: Diagnosis not present

## 2020-04-17 DIAGNOSIS — L821 Other seborrheic keratosis: Secondary | ICD-10-CM | POA: Diagnosis not present

## 2020-04-17 DIAGNOSIS — D2262 Melanocytic nevi of left upper limb, including shoulder: Secondary | ICD-10-CM | POA: Diagnosis not present

## 2020-04-17 DIAGNOSIS — D2261 Melanocytic nevi of right upper limb, including shoulder: Secondary | ICD-10-CM | POA: Diagnosis not present

## 2020-04-17 DIAGNOSIS — D2271 Melanocytic nevi of right lower limb, including hip: Secondary | ICD-10-CM | POA: Diagnosis not present

## 2020-04-17 DIAGNOSIS — D225 Melanocytic nevi of trunk: Secondary | ICD-10-CM | POA: Diagnosis not present

## 2020-04-18 DIAGNOSIS — H04123 Dry eye syndrome of bilateral lacrimal glands: Secondary | ICD-10-CM | POA: Diagnosis not present

## 2020-04-18 DIAGNOSIS — H5213 Myopia, bilateral: Secondary | ICD-10-CM | POA: Diagnosis not present

## 2020-04-18 DIAGNOSIS — H43813 Vitreous degeneration, bilateral: Secondary | ICD-10-CM | POA: Diagnosis not present

## 2020-04-18 DIAGNOSIS — H2513 Age-related nuclear cataract, bilateral: Secondary | ICD-10-CM | POA: Diagnosis not present

## 2020-06-08 NOTE — Progress Notes (Addendum)
Chief Complaint  Patient presents with  . Annual Exam    Doing well    HPI: Nicole [name] 74 y.o. comes in today for Preventive Medicare exam/ wellness visit .Since last visitsleep irregularity  husbands declining   Health and incurable prostate cancer  . sometimes panics  But  Wants to avoid  Dependence producing meds  ( felt dependent in past when hx breast cancer rx )  Using melatonin  Not that helpful    Dermatology Rolm Bookbinder.    bx  working on    Lesion right  Is high risk lesion  hld  Taking med  HB take  Famotidine  And prn tums   Stable   No dysphagia   Health Maintenance  Topic Date Due  . INFLUENZA VACCINE  06/24/2020  . COLONOSCOPY  06/18/2021  . TETANUS/TDAP  04/14/2024  . DEXA SCAN  Completed  . COVID-19 Vaccine  Completed  . Hepatitis C Screening  Completed  . PNA vac Low Risk Adult  Completed   Health Maintenance Review LIFESTYLE:  Exercise:  In past 2 mos 1-2 miles per day and garden .  Tobacco/ETS:n Alcohol:  6 per week  Sugar beverages: Sleep: 4-9 hours  Drug use: no HH:2  No pets  Husband in final stages  Of cancer .   Prostate.   Melatonin .   Panic     Hearing: ok  Vision:  No limitations at present . Last eye check UTD  Safety:  Has smoke detector and wears seat belts. No excess sun exposure. Sees dentist regularly.  Falls: n  Advance directive :  Reviewed  Has one.  Memory: Felt to be good  , no concern from her or her family.  Depression: No anhedonia unusual crying or depressive symptoms  Nutrition: Eats well balanced diet; adequate calcium and vitamin D. No swallowing chewing problems.  Injury: no major injuries in the last six months.  Other healthcare providers:  Reviewed today .  Preventive parameters: up-to-date  Reviewed   ADLS:   There are no problems or need for assistance  driving, feeding, obtaining food, dressing, toileting and bathing, managing money using phone. She is independent.   ROS:  GEN/ HEENT: No  fever, significant weight changes sweats headaches vision problems hearing changes, CV/ PULM; No chest pain shortness of breath cough, syncope,edema  change in exercise tolerance. GI /GU: No adominal pain, vomiting, change in bowel habits. No blood in the stool. No significant GU symptoms. SKIN/HEME: ,no acute skin rashes suspicious lesions or bleeding. No lymphadenopathy, nodules, masses.  NEURO/ PSYCH:  No neurologic signs such as weakness numbness. No depression but reactive to situation  IMM/ Allergy: No unusual infections.  Allergy .   REST of 12 system review negative except as per HPI   Past Medical History:  Diagnosis Date  . BRCA1 positive   . Breast cancer ([place])    Left- Ductal ca SR-, Chemo ([place]) rad rx   . GERD (gastroesophageal reflux disease)   . H/O migraine   . HSV-2 (herpes simplex virus 2) infection   . Hypercholesteremia   . Rheumatoid factor positive    OA   had rheum consult  . Scoliosis   . Sprain of ankle 03/06/2015   revewiwed care exercise and fu if needed    . Tinnitus     Family History  Problem Relation Age of Onset  . Hypertension Mother   . Heart disease Mother   . Stroke Mother   .  Arthritis Mother   . Heart attack Brother   . Arthritis Other        maternal side RA cousin  . Kidney disease Other        maternal side  . Diabetes Other        1st degree relative, paternal side  . Diabetes Father   . Lung cancer Father     Social History   Socioeconomic History  . Marital status: Married    Spouse name: Not on file  . Number of children: Not on file  . Years of education: Not on file  . Highest education level: Not on file  Occupational History  . Not on file  Tobacco Use  . Smoking status: Former Research scientist (life sciences)  . Smokeless tobacco: Never Used  . Tobacco comment: limited in college   Vaping Use  . Vaping Use: Never used  Substance and Sexual Activity  . Alcohol use: Yes    Alcohol/week: 7.0 standard drinks    Types: 7 Glasses of  wine per week  . Drug use: No  . Sexual activity: Not Currently    Birth control/protection: Post-menopausal  Other Topics Concern  . Not on file  Social History Narrative   Married, household of 2. 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 Designated Party release signed 04/17/10.      Husband with final stage prostate cancer at this time.   hh of 2    Social Determinants of Health   Financial Resource Strain: Low Risk   . Difficulty of Paying Living Expenses: Not hard at all  Food Insecurity: No Food Insecurity  . Worried About Charity fundraiser in the Last Year: Never true  . Ran Out of Food in the Last Year: Never true  Transportation Needs: No Transportation Needs  . Lack of Transportation (Medical): No  . Lack of Transportation (Non-Medical): No  Physical Activity: Sufficiently Active  . Days of Exercise per Week: 5 days  . Minutes of Exercise per Session: 60 min  Stress: No Stress Concern Present  . Feeling of Stress : Only a little  Social Connections: Moderately Integrated  . Frequency of Communication with Friends and Family: More than three times a week  . Frequency of Social Gatherings with Friends and Family: Three times a week  . Attends Religious Services: Never  . Active Member of Clubs or Organizations: Yes  . Attends Archivist Meetings: More than 4 times per year  . Marital Status: Married    Outpatient Encounter Medications as of 06/11/2020  Medication Sig  . acyclovir (ZOVIRAX) 200 MG capsule TAKE TWO CAPSULES BY MOUTH 3 TIMES DAILY FOR OUTBREAK Please schedule follow up for further refills. [phone]  . acyclovir ointment (ZOVIRAX) 5 % Apply 1 application topically 3 (three) times daily as needed.  Marland Kitchen b complex vitamins tablet Take 1 tablet by mouth daily.  . calcium carbonate (OS-CAL) 600 MG TABS Take 600 mg by mouth 3 (three) times daily with meals.    . famotidine (PEPCID) 20 MG tablet Take 20 mg by mouth 2 (two) times  daily.  Marland Kitchen lovastatin (MEVACOR) 20 MG tablet Take 1 tablet (20 mg total) by mouth daily. Please schedule follow up for further refills. [phone]  . Magnesium 300 MG CAPS Take 1 capsule by mouth daily.   Marland Kitchen VITAMIN A PO Take by mouth daily.  . vitamin E (VITAMIN E) 180 MG (400 UNITS) capsule Take 400 Units by mouth daily.  . [  DISCONTINUED] Vitamin D, Cholecalciferol, 1000 UNITS CAPS Take by mouth.  . fish oil-omega-3 fatty acids 1000 MG capsule Take 1 g by mouth daily.  (Patient not taking: Reported on 06/11/2020)  . traZODone (DESYREL) 50 MG tablet Take 0.5-1 tablets (25-50 mg total) by mouth at bedtime as needed for sleep.  . [DISCONTINUED] Ascorbic Acid (VITAMIN C) 500 MG CAPS  (Patient not taking: Reported on 06/11/2020)   No facility-administered encounter medications on file as of 06/11/2020.    EXAM:  BP 126/78   Pulse 84   Temp 97.9 F (36.6 C) (Oral)   Ht 5' 1.75" (1.568 m)   Wt 133 lb 9.6 oz (60.6 kg)   LMP 11/24/1997 (Approximate)   SpO2 98%   BMI 24.63 kg/m   Body mass index is 24.63 kg/m.  Physical Exam: Vital signs reviewed [id number]:[id number] is a well-developed well-nourished alert cooperative   who appears stated age in no acute distress.  HEENT: normocephalic atraumatic , Eyes: PERRL EOM's full, conjunctiva clear, Nares: paten,t no deformity discharge or tenderness., Ears: no deformity EAC's clear TMs with normal landmarks. Mouth: clear OP, masked  NECK: supple without masses, thyromegaly or bruits. CHEST/PULM:  Clear to auscultation and percussion breath sounds equal no wheeze , rales or rhonchi. No chest wall deformities or tenderness. Breast absent   No adenopathy has  Xyphoid  Process pal and identified by patient  CV: PMI is nondisplaced, S1 S2 no gallops, murmurs, rubs. Peripheral pulses are present  without delay.No JVD .  ABDOMEN: Bowel sounds normal nontender  No guard or rebound, no hepato splenomegal no CVA tenderness.   Extremtities:  No clubbing cyanosis or  edema, no acute joint swelling or redness no focal atrophy NEURO:  Oriented x3, cranial nerves 3-12 appear to be intact, no obvious focal weakness,gait within normal limits no abnormal reflexes or asymmetrical SKIN: No acute rashes normal turgor, color, no bruising or petechiae. PSYCH: Oriented, good eye contact, no obvious depression anxietycognition and judgment appear normal. LN: no cervical axillary inguinal adenopathy No noted deficits in memory, attention, and speech.   Lab Results  Component Value Date   WBC 4.4 06/10/2019   HGB 12.6 06/10/2019   HCT 38.1 06/10/2019   PLT 246.0 06/10/2019   GLUCOSE 91 06/10/2019   CHOL 208 (H) 06/10/2019   TRIG 119.0 06/10/2019   HDL 68.00 06/10/2019   LDLDIRECT 119.9 12/30/2012   LDLCALC 116 (H) 06/10/2019   ALT 19 06/10/2019   AST 21 06/10/2019   NA 139 06/10/2019   K 4.7 06/10/2019   CL 100 06/10/2019   CREATININE 0.96 06/10/2019   BUN 22 06/10/2019   CO2 32 06/10/2019   TSH 1.28 03/24/2018   HGBA1C 6.0 06/10/2019    ASSESSMENT AND PLAN:  Discussed the following assessment and plan:  Visit for preventive health examination  Mixed hyperlipidemia - Plan: CBC with Differential/Platelet, Hepatic function panel, Lipid panel, TSH, BASIC METABOLIC PANEL WITH GFR, BASIC METABOLIC PANEL WITH GFR, TSH, Lipid panel, Hepatic function panel, CBC with Differential/Platelet  Medication management - Plan: CBC with Differential/Platelet, Hepatic function panel, Lipid panel, TSH, BASIC METABOLIC PANEL WITH GFR, BASIC METABOLIC PANEL WITH GFR, TSH, Lipid panel, Hepatic function panel, CBC with Differential/Platelet  BRCA1 positive - Plan: CBC with Differential/Platelet, Hepatic function panel, Lipid panel, TSH, TSH, Lipid panel, Hepatic function panel, CBC with Differential/Platelet  Personal history of malignant neoplasm of breast - Plan: CBC with Differential/Platelet, Hepatic function panel, Lipid panel, TSH, BASIC METABOLIC PANEL WITH GFR,  BASIC METABOLIC  PANEL WITH GFR, TSH, Lipid panel, Hepatic function panel, CBC with Differential/Platelet  Sleep disturbance - Plan: CBC with Differential/Platelet, Hepatic function panel, Lipid panel, TSH, BASIC METABOLIC PANEL WITH GFR, BASIC METABOLIC PANEL WITH GFR, TSH, Lipid panel, Hepatic function panel, CBC with Differential/Platelet  Stress due to illness of family member Disc  options sleep as reactive and anxiety  Triggered     Wants to avoid dependent risk medicine can try add on trazodone  Get back in 3 -4 mos  Or so or earlier if needed  Poss helped by low dose citalopram etc   reassurance that bump is nl xyphoid process. Also hb   More eval if alarm sx but not at this time  [facility] to maintain same meds at this time  Patient Care Team: Teola Felipe, [name], MD as PCP - General Bo Merino, MD (Rheumatology) Ronald Lobo, MD (Gastroenterology) Phylliss Bob, MD as Consulting Physician (Orthopedic Surgery) Milly Jakob, MD as Consulting Physician (Orthopedic Surgery) Marygrace Drought, MD as Consulting Physician (Ophthalmology)  Patient Instructions    Will notify you  of labs when available.  Try med at night 25 - 75 mg  And stay on melatonin  Can consider other options    as indicated  If progressive heart burn   Advise more evaluation but   No alarm sx today  You are feeling xyphoid process   And this is a normal anatomy for you.    Preventive Care 21 Years and Older, Female Preventive care refers to lifestyle choices and visits with your health care provider that can promote health and wellness. This includes:  A yearly physical exam. This is also called an annual well check.  Regular dental and eye exams.  Immunizations.  Screening for certain conditions.  Healthy lifestyle choices, such as diet and exercise. What can I expect for my preventive care visit? Physical exam Your health care provider will check:  Height and weight. These may be used to  calculate body mass index (BMI), which is a measurement that tells if you are at a healthy weight.  Heart rate and blood pressure.  Your skin for abnormal spots. Counseling Your health care provider may ask you questions about:  Alcohol, tobacco, and drug use.  Emotional well-being.  Home and relationship well-being.  Sexual activity.  Eating habits.  History of falls.  Memory and ability to understand (cognition).  Work and work Statistician.  Pregnancy and menstrual history. What immunizations do I need?  Influenza (flu) vaccine  This is recommended every year. Tetanus, diphtheria, and pertussis (Tdap) vaccine  You may need a Td booster every 10 years. Varicella (chickenpox) vaccine  You may need this vaccine if you have not already been vaccinated. Zoster (shingles) vaccine  You may need this after age 53. Pneumococcal conjugate (PCV13) vaccine  One dose is recommended after age 60. Pneumococcal polysaccharide (PPSV23) vaccine  One dose is recommended after age 33. Measles, mumps, and rubella (MMR) vaccine  You may need at least one dose of MMR if you were born in 1957 or later. You may also need a second dose. Meningococcal conjugate (MenACWY) vaccine  You may need this if you have certain conditions. Hepatitis A vaccine  You may need this if you have certain conditions or if you travel or work in places where you may be exposed to hepatitis A. Hepatitis B vaccine  You may need this if you have certain conditions or if you travel or work in places where you may  be exposed to hepatitis B. Haemophilus influenzae type b (Hib) vaccine  You may need this if you have certain conditions. You may receive vaccines as individual doses or as more than one vaccine together in one shot (combination vaccines). Talk with your health care provider about the risks and benefits of combination vaccines. What tests do I need? Blood tests  Lipid and cholesterol levels.  These may be checked every 5 years, or more frequently depending on your overall health.  Hepatitis C test.  Hepatitis B test. Screening  Lung cancer screening. You may have this screening every year starting at age 33 if you have a 30-pack-year history of smoking and currently smoke or have quit within the past 15 years.  Colorectal cancer screening. All adults should have this screening starting at age 73 and continuing until age 66. Your health care provider may recommend screening at age 71 if you are at increased risk. You will have tests every 1-10 years, depending on your results and the type of screening test.  Diabetes screening. This is done by checking your blood sugar (glucose) after you have not eaten for a while (fasting). You may have this done every 1-3 years.  Mammogram. This may be done every 1-2 years. Talk with your health care provider about how often you should have regular mammograms.  BRCA-related cancer screening. This may be done if you have a family history of breast, ovarian, tubal, or peritoneal cancers. Other tests  Sexually transmitted disease (STD) testing.  Bone density scan. This is done to screen for osteoporosis. You may have this done starting at age 73. Follow these instructions at home: Eating and drinking  Eat a diet that includes fresh fruits and vegetables, whole grains, lean protein, and low-fat dairy products. Limit your intake of foods with high amounts of sugar, saturated fats, and salt.  Take vitamin and mineral supplements as recommended by your health care provider.  Do not drink alcohol if your health care provider tells you not to drink.  If you drink alcohol: ? Limit how much you have to 0-1 drink a day. ? Be aware of how much alcohol is in your drink. In the [place] one drink equals one 12 oz bottle of beer (355 mL), one 5 oz glass of wine (148 mL), or one 1 oz glass of hard liquor (44 mL). Lifestyle  Take daily care of your teeth  and gums.  Stay active. Exercise for at least 30 minutes on 5 or more days each week.  Do not use any products that contain nicotine or tobacco, such as cigarettes, e-cigarettes, and chewing tobacco. If you need help quitting, ask your health care provider.  If you are sexually active, practice safe sex. Use a condom or other form of protection in order to prevent STIs (sexually transmitted infections).  Talk with your health care provider about taking a low-dose aspirin or statin. What's next?  Go to your health care provider once a year for a well check visit.  Ask your health care provider how often you should have your eyes and teeth checked.  Stay up to date on all vaccines. This information is not intended to replace advice given to you by your health care provider. Make sure you discuss any questions you have with your health care provider. Document Revised: 11/04/2018 Document Reviewed: 11/04/2018 Elsevier Patient Education  2020 Brule Kash [place] M.D.

## 2020-06-11 ENCOUNTER — Encounter: Payer: Self-pay | Admitting: Internal Medicine

## 2020-06-11 ENCOUNTER — Other Ambulatory Visit: Payer: Self-pay

## 2020-06-11 ENCOUNTER — Ambulatory Visit (INDEPENDENT_AMBULATORY_CARE_PROVIDER_SITE_OTHER): Payer: Medicare PPO | Admitting: Internal Medicine

## 2020-06-11 ENCOUNTER — Ambulatory Visit (INDEPENDENT_AMBULATORY_CARE_PROVIDER_SITE_OTHER): Payer: Medicare PPO

## 2020-06-11 VITALS — BP 126/78 | HR 84 | Temp 97.9°F | Ht 61.75 in | Wt 133.6 lb

## 2020-06-11 DIAGNOSIS — Z0000 Encounter for general adult medical examination without abnormal findings: Secondary | ICD-10-CM | POA: Diagnosis not present

## 2020-06-11 DIAGNOSIS — Z1509 Genetic susceptibility to other malignant neoplasm: Secondary | ICD-10-CM

## 2020-06-11 DIAGNOSIS — E782 Mixed hyperlipidemia: Secondary | ICD-10-CM | POA: Diagnosis not present

## 2020-06-11 DIAGNOSIS — Z79899 Other long term (current) drug therapy: Secondary | ICD-10-CM

## 2020-06-11 DIAGNOSIS — Z6379 Other stressful life events affecting family and household: Secondary | ICD-10-CM

## 2020-06-11 DIAGNOSIS — Z853 Personal history of malignant neoplasm of breast: Secondary | ICD-10-CM | POA: Diagnosis not present

## 2020-06-11 DIAGNOSIS — G479 Sleep disorder, unspecified: Secondary | ICD-10-CM | POA: Diagnosis not present

## 2020-06-11 DIAGNOSIS — Z1501 Genetic susceptibility to malignant neoplasm of breast: Secondary | ICD-10-CM

## 2020-06-11 MED ORDER — TRAZODONE HCL 50 MG PO TABS: 25.0000 mg | ORAL_TABLET | Freq: Every evening | ORAL | 1 refills | Status: DC | PRN

## 2020-06-11 NOTE — Progress Notes (Signed)
Subjective:   Nicole [name] is a 74 y.o. female who presents for Medicare Annual (Subsequent) preventive examination.  Review of Systems    N/A Cardiac Risk Factors include: advanced age (>28mn, >>22women)     Objective:    There were no vitals filed for this visit. There is no height or weight on file to calculate BMI.  Advanced Directives 06/11/2020 08/11/2019 03/18/2017  Does Patient Have a Medical Advance Directive? Yes Yes Yes  Type of [place]amed[place] -  Does patient want to make changes to medical advance directive? No - Patient declined - -  Copy of [place] Chart? No - copy requested No - copy requested -    Current Medications (verified) Outpatient Encounter Medications as of 06/11/2020  Medication Sig  . acyclovir (ZOVIRAX) 200 MG capsule TAKE TWO CAPSULES BY MOUTH 3 TIMES DAILY FOR OUTBREAK Please schedule follow up for further refills. [phone] . acyclovir ointment (ZOVIRAX) 5 % Apply 1 application topically 3 (three) times daily as needed.  .Marland Kitchenb complex vitamins tablet Take 1 tablet by mouth daily.  . calcium carbonate (OS-CAL) 600 MG TABS Take 600 mg by mouth 3 (three) times daily with meals.    . famotidine (PEPCID) 20 MG tablet Take 20 mg by mouth 2 (two) times daily.  .Marland Kitchenlovastatin (MEVACOR) 20 MG tablet Take 1 tablet (20 mg total) by mouth daily. Please schedule follow up for further refills. [phone] . Magnesium 300 MG CAPS Take 1 capsule by mouth daily.   . traZODone (DESYREL) 50 MG tablet Take 0.5-1 tablets (25-50 mg total) by mouth at bedtime as needed for sleep.  .Marland KitchenVITAMIN A PO Take by mouth daily.  . vitamin E (VITAMIN E) 180 MG (400 UNITS) capsule Take 400 Units by mouth daily.  . [DISCONTINUED] Vitamin D, Cholecalciferol, 1000 UNITS CAPS Take by mouth.  . fish oil-omega-3 fatty acids 1000 MG capsule Take 1 g by mouth daily.  (Patient not  taking: Reported on 06/11/2020)  . [DISCONTINUED] Ascorbic Acid (VITAMIN C) 500 MG CAPS  (Patient not taking: Reported on 06/11/2020)   No facility-administered encounter medications on file as of 06/11/2020.    Allergies (verified) Sulfonamide derivatives and Latex   History: Past Medical History:  Diagnosis Date  . BRCA1 positive   . Breast cancer ([facility]    Left- Ductal ca SR-, Chemo ([place]) rad rx   . GERD (gastroesophageal reflux disease)   . H/O migraine   . HSV-2 (herpes simplex virus 2) infection   . Hypercholesteremia   . Rheumatoid factor positive    OA   had rheum consult  . Scoliosis   . Sprain of ankle 03/06/2015   revewiwed care exercise and fu if needed    . Tinnitus    Past Surgical History:  Procedure Laterality Date  . BREAST LUMPECTOMY    . LAPAROSCOPIC BILATERAL SALPINGO OOPHERECTOMY  2006   Dr. RJoan Flores . MASTECTOMY Bilateral 2006   Family History  Problem Relation Age of Onset  . Hypertension Mother   . Heart disease Mother   . Stroke Mother   . Arthritis Mother   . Heart attack Brother   . Arthritis Other        maternal side RA cousin  . Kidney disease Other        maternal side  . Diabetes Other        1st degree  relative, paternal side  . Diabetes Father   . Lung cancer Father    Social History   Socioeconomic History  . Marital status: Married    Spouse name: Not on file  . Number of children: Not on file  . Years of education: Not on file  . Highest education level: Not on file  Occupational History  . Not on file  Tobacco Use  . Smoking status: Former Research scientist (life sciences)  . Smokeless tobacco: Never Used  . Tobacco comment: limited in college   Vaping Use  . Vaping Use: Never used  Substance and Sexual Activity  . Alcohol use: Yes    Alcohol/week: 7.0 standard drinks    Types: 7 Glasses of wine per week  . Drug use: No  . Sexual activity: Not Currently    Birth control/protection: Post-menopausal  Other Topics Concern  . Not on file    Social History Narrative   Married, household of 2. 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 Designated Party release signed 04/17/10.      Husband with final stage prostate cancer at this time.   hh of 2    Social Determinants of Health   Financial Resource Strain: Low Risk   . Difficulty of Paying Living Expenses: Not hard at all  Food Insecurity: No Food Insecurity  . Worried About Charity fundraiser in the Last Year: Never true  . Ran Out of Food in the Last Year: Never true  Transportation Needs: No Transportation Needs  . Lack of Transportation (Medical): No  . Lack of Transportation (Non-Medical): No  Physical Activity: Sufficiently Active  . Days of Exercise per Week: 5 days  . Minutes of Exercise per Session: 60 min  Stress: No Stress Concern Present  . Feeling of Stress : Only a little  Social Connections: Moderately Integrated  . Frequency of Communication with Friends and Family: More than three times a week  . Frequency of Social Gatherings with Friends and Family: Three times a week  . Attends Religious Services: Never  . Active Member of Clubs or Organizations: Yes  . Attends Archivist Meetings: More than 4 times per year  . Marital Status: Married    Tobacco Counseling Counseling given: Not Answered Comment: limited in college    Clinical Intake:  Pre-visit preparation completed: Yes  Pain : No/denies pain     Nutritional Status: BMI of 19-24  Normal Nutritional Risks: None Diabetes: No  What is the last grade level you completed in school?: Master's Degree  Diabetic?No  Interpreter Needed?: No  Information entered by :: [place] of Daily Living In your present state of health, do you have any difficulty performing the following activities: 06/11/2020  Hearing? N  Comment Has tinnitus  Vision? N  Difficulty concentrating or making decisions? N  Walking or climbing stairs? N  Dressing or  bathing? N  Doing errands, shopping? N  Preparing Food and eating ? N  Using the Toilet? N  In the past six months, have you accidently leaked urine? N  Do you have problems with loss of bowel control? N  Managing your Medications? N  Managing your Finances? N  Housekeeping or managing your Housekeeping? N  Some recent data might be hidden    Patient Care Team: [name], [name], MD as PCP - General Bo Merino, MD (Rheumatology) Ronald Lobo, MD (Gastroenterology) Phylliss Bob, MD as Consulting Physician (Orthopedic Surgery) Milly Jakob, MD as Consulting Physician (  Orthopedic Surgery) Marygrace Drought, MD as Consulting Physician (Ophthalmology)  Indicate any recent Medical Services you may have received from other than [facility] providers in the past year (date may be approximate).     Assessment:   This is a routine wellness examination for Gianina.  Hearing/Vision screen  Hearing Screening   125Hz  250Hz  500Hz  1000Hz  2000Hz  3000Hz  4000Hz  6000Hz  8000Hz   Right ear:           Left ear:           Vision Screening Comments: Patient states gets annual eye exams   Dietary issues and exercise activities discussed: Current Exercise Habits: Home exercise routine, Type of exercise: walking, Time (Minutes): 60, Frequency (Times/Week): 5, Weekly Exercise (Minutes/Week): 300, Intensity: Mild, Exercise limited by: None identified  Goals    . Patient Stated     I will continue to walk 1.5-2 miles daily    . Weight (lb) < 138 lb (62.6 kg)     Keep going to weight watchers;  Check out  online nutrition programs as GumSearch.nl and http://vang.com/; fit65m; Look for foods with "whole" wheat; bran; oatmeal etc Shot at the farmer's markets in season for fresher choices  Watch for "hydrogenated" on the label of oils which are trans-fats.  Watch for "high fructose corn syrup" in snacks, yogurt or ketchup  Meats have less marbling; bright colored fruits and vegetables;  Canned;  dump out liquid and wash vegetables. Be mindful of what we are eating  Portion control is essential to a health weight! Sit down; take a break and enjoy your meal; take smaller bites; put the fork down between bites;  It takes 20 minutes to get full; so check in with your fullness cues and stop eating when you start to fill full              Depression Screen PHQ 2/9 Scores 06/11/2020 06/11/2020 03/24/2018 03/18/2017 03/17/2016 03/06/2015 04/14/2014  PHQ - 2 Score 0 0 0 0 0 0 0  PHQ- 9 Score 0 0 - - - - -    Fall Risk Fall Risk  06/11/2020 03/24/2018 03/18/2017 03/17/2016 03/06/2015  Falls in the past year? 1 Yes No No -  Comment Tripped over dishwasher door - - - -  Number falls in past yr: 0 1 - - (No Data)  Comment - - - - ran off step during step exercise class   Injury with Fall? 0 Yes - - Yes  Comment - broke right foot after tripping over a brick - - sprained ankle  Follow up Falls evaluation completed;Follow up appointment - - - -    Any stairs in or around the home? No  If so, are there any without handrails? No  Home free of loose throw rugs in walkways, pet beds, electrical cords, etc? Yes  Adequate lighting in your home to reduce risk of falls? Yes   ASSISTIVE DEVICES UTILIZED TO PREVENT FALLS:  Life alert? No  Use of a cane, walker or w/c? No  Grab bars in the bathroom? Yes  Shower chair or bench in shower? No  Elevated toilet seat or a handicapped toilet? No   TIMED UP AND GO:  Was the test performed? Yes .  Length of time to ambulate 10 feet: 5 sec.   Gait steady and fast without use of assistive device  Cognitive Function: Screening not indicated by direct observation MMSE - Mini Mental State Exam 03/18/2017  Not completed: (No Data)  Immunizations Immunization History  Administered Date(s) Administered  . Fluad Quad(high Dose 65+) 08/20/2019  . Influenza Split 10/08/2011  . Influenza Whole 10/13/2007, 08/14/2009  . Influenza, High Dose Seasonal  PF 01/19/2018  . Influenza,inj,Quad PF,6+ Mos 03/06/2015  . Moderna SARS-COVID-2 Vaccination 12/30/2019, 01/31/2020  . Pneumococcal Conjugate-13 01/10/2013  . Pneumococcal Polysaccharide-23 04/14/2014  . Td 11/24/2002  . Tetanus 04/14/2014  . Zoster Recombinat (Shingrix) 07/05/2018, 09/18/2018, 10/14/2018, 12/15/2018    TDAP status: Up to date Flu Vaccine status: Up to date Pneumococcal vaccine status: Up to date Covid-19 vaccine status: Completed vaccines  Qualifies for Shingles Vaccine? Yes   Zostavax completed No   Shingrix Completed?: Yes  Screening Tests Health Maintenance  Topic Date Due  . INFLUENZA VACCINE  06/24/2020  . COLONOSCOPY  06/18/2021  . TETANUS/TDAP  04/14/2024  . DEXA SCAN  Completed  . COVID-19 Vaccine  Completed  . Hepatitis C Screening  Completed  . PNA vac Low Risk Adult  Completed    Health Maintenance  There are no preventive care reminders to display for this patient.  Colorectal cancer screening: Completed 06/19/2011. Repeat every 10 years Mammogram: No longer required. Patient had bilateral mastectomy  Bone Density status: Completed 02/06/2020. Results reflect: Bone density results: OSTEOPENIA. Repeat every 2 years.  Lung Cancer Screening: (Low Dose CT Chest recommended if Age 47-80 years, 30 pack-year currently smoking OR have quit w/in 15years.) does not qualify.   Lung Cancer Screening Referral: N/A  Additional Screening:  Hepatitis C Screening: does qualify; Completed 06/10/2019  Vision Screening: Recommended annual ophthalmology exams for early detection of glaucoma and other disorders of the eye. Is the patient up to date with their annual eye exam?  Yes  Who is the provider or what is the name of the office in which the patient attends annual eye exams? Dr. Satira Sark If pt is not established with a provider, would they like to be referred to a provider to establish care? No .   Dental Screening: Recommended annual dental exams for  proper oral hygiene  Community Resource Referral / Chronic Care Management: CRR required this visit?  No   CCM required this visit?  No      Plan:     I have personally reviewed and noted the following in the patient's chart:   . Medical and social history . Use of alcohol, tobacco or illicit drugs  . Current medications and supplements . Functional ability and status . Nutritional status . Physical activity . Advanced directives . List of other physicians . Hospitalizations, surgeries, and ER visits in previous 12 months . Vitals . Screenings to include cognitive, depression, and falls . Referrals and appointments  In addition, I have reviewed and discussed with patient certain preventive protocols, quality metrics, and best practice recommendations. A written personalized care plan for preventive services as well as general preventive health recommendations were provided to patient.     Ofilia Neas, LPN   [id number]   Nurse Notes: None

## 2020-06-11 NOTE — Patient Instructions (Addendum)
Will notify you  of labs when available.  Try med at night 25 - 75 mg  And stay on melatonin  Can consider other options    as indicated  If progressive heart burn   Advise more evaluation but   No alarm sx today  You are feeling xyphoid process   And this is a normal anatomy for you.    Preventive Care 60 Years and Older, Female Preventive care refers to lifestyle choices and visits with your health care provider that can promote health and wellness. This includes:  A yearly physical exam. This is also called an annual well check.  Regular dental and eye exams.  Immunizations.  Screening for certain conditions.  Healthy lifestyle choices, such as diet and exercise. What can I expect for my preventive care visit? Physical exam Your health care provider will check:  Height and weight. These may be used to calculate body mass index (BMI), which is a measurement that tells if you are at a healthy weight.  Heart rate and blood pressure.  Your skin for abnormal spots. Counseling Your health care provider may ask you questions about:  Alcohol, tobacco, and drug use.  Emotional well-being.  Home and relationship well-being.  Sexual activity.  Eating habits.  History of falls.  Memory and ability to understand (cognition).  Work and work Statistician.  Pregnancy and menstrual history. What immunizations do I need?  Influenza (flu) vaccine  This is recommended every year. Tetanus, diphtheria, and pertussis (Tdap) vaccine  You may need a Td booster every 10 years. Varicella (chickenpox) vaccine  You may need this vaccine if you have not already been vaccinated. Zoster (shingles) vaccine  You may need this after age 32. Pneumococcal conjugate (PCV13) vaccine  One dose is recommended after age 77. Pneumococcal polysaccharide (PPSV23) vaccine  One dose is recommended after age 66. Measles, mumps, and rubella (MMR) vaccine  You may need at least one dose  of MMR if you were born in 1957 or later. You may also need a second dose. Meningococcal conjugate (MenACWY) vaccine  You may need this if you have certain conditions. Hepatitis A vaccine  You may need this if you have certain conditions or if you travel or work in places where you may be exposed to hepatitis A. Hepatitis B vaccine  You may need this if you have certain conditions or if you travel or work in places where you may be exposed to hepatitis B. Haemophilus influenzae type b (Hib) vaccine  You may need this if you have certain conditions. You may receive vaccines as individual doses or as more than one vaccine together in one shot (combination vaccines). Talk with your health care provider about the risks and benefits of combination vaccines. What tests do I need? Blood tests  Lipid and cholesterol levels. These may be checked every 5 years, or more frequently depending on your overall health.  Hepatitis C test.  Hepatitis B test. Screening  Lung cancer screening. You may have this screening every year starting at age 13 if you have a 30-pack-year history of smoking and currently smoke or have quit within the past 15 years.  Colorectal cancer screening. All adults should have this screening starting at age 43 and continuing until age 66. Your health care provider may recommend screening at age 37 if you are at increased risk. You will have tests every 1-10 years, depending on your results and the type of screening test.  Diabetes screening. This  is done by checking your blood sugar (glucose) after you have not eaten for a while (fasting). You may have this done every 1-3 years.  Mammogram. This may be done every 1-2 years. Talk with your health care provider about how often you should have regular mammograms.  BRCA-related cancer screening. This may be done if you have a family history of breast, ovarian, tubal, or peritoneal cancers. Other tests  Sexually transmitted  disease (STD) testing.  Bone density scan. This is done to screen for osteoporosis. You may have this done starting at age 27. Follow these instructions at home: Eating and drinking  Eat a diet that includes fresh fruits and vegetables, whole grains, lean protein, and low-fat dairy products. Limit your intake of foods with high amounts of sugar, saturated fats, and salt.  Take vitamin and mineral supplements as recommended by your health care provider.  Do not drink alcohol if your health care provider tells you not to drink.  If you drink alcohol: ? Limit how much you have to 0-1 drink a day. ? Be aware of how much alcohol is in your drink. In the [place] one drink equals one 12 oz bottle of beer (355 mL), one 5 oz glass of wine (148 mL), or one 1 oz glass of hard liquor (44 mL). Lifestyle  Take daily care of your teeth and gums.  Stay active. Exercise for at least 30 minutes on 5 or more days each week.  Do not use any products that contain nicotine or tobacco, such as cigarettes, e-cigarettes, and chewing tobacco. If you need help quitting, ask your health care provider.  If you are sexually active, practice safe sex. Use a condom or other form of protection in order to prevent STIs (sexually transmitted infections).  Talk with your health care provider about taking a low-dose aspirin or statin. What's next?  Go to your health care provider once a year for a well check visit.  Ask your health care provider how often you should have your eyes and teeth checked.  Stay up to date on all vaccines. This information is not intended to replace advice given to you by your health care provider. Make sure you discuss any questions you have with your health care provider. Document Revised: 11/04/2018 Document Reviewed: 11/04/2018 Elsevier Patient Education  2020 [employer].

## 2020-06-11 NOTE — Patient Instructions (Signed)
Nicole [name] , Thank you for taking time to come for your Medicare Wellness Visit. I appreciate your ongoing commitment to your health goals. Please review the following plan we discussed and let me know if I can assist you in the future.   Screening recommendations/referrals: Colonoscopy: Up to date, next due 06/18/2021 Mammogram: no longer required Bone Density: Up to date next due 02/05/2022 Recommended yearly ophthalmology/optometry visit for glaucoma screening and checkup Recommended yearly dental visit for hygiene and checkup  Vaccinations: Influenza vaccine: Up to date, next due 06/2020 Pneumococcal vaccine: completed series Tdap vaccine: Up to date, next due 04/14/2024 Shingles vaccine: Completed series    Advanced directives: Copy in chart  Conditions/risks identified: None   Next appointment: None   Preventive Care 17 Years and Older, Female Preventive care refers to lifestyle choices and visits with your health care provider that can promote health and wellness. What does preventive care include?  A yearly physical exam. This is also called an annual well check.  Dental exams once or twice a year.  Routine eye exams. Ask your health care provider how often you should have your eyes checked.  Personal lifestyle choices, including:  Daily care of your teeth and gums.  Regular physical activity.  Eating a healthy diet.  Avoiding tobacco and drug use.  Limiting alcohol use.  Practicing safe sex.  Taking low-dose aspirin every day.  Taking vitamin and mineral supplements as recommended by your health care provider. What happens during an annual well check? The services and screenings done by your health care provider during your annual well check will depend on your age, overall health, lifestyle risk factors, and family history of disease. Counseling  Your health care provider may ask you questions about your:  Alcohol use.  Tobacco use.  Drug  use.  Emotional well-being.  Home and relationship well-being.  Sexual activity.  Eating habits.  History of falls.  Memory and ability to understand (cognition).  Work and work Statistician.  Reproductive health. Screening  You may have the following tests or measurements:  Height, weight, and BMI.  Blood pressure.  Lipid and cholesterol levels. These may be checked every 5 years, or more frequently if you are over 90 years old.  Skin check.  Lung cancer screening. You may have this screening every year starting at age 5 if you have a 30-pack-year history of smoking and currently smoke or have quit within the past 15 years.  Fecal occult blood test (FOBT) of the stool. You may have this test every year starting at age 86.  Flexible sigmoidoscopy or colonoscopy. You may have a sigmoidoscopy every 5 years or a colonoscopy every 10 years starting at age 79.  Hepatitis C blood test.  Hepatitis B blood test.  Sexually transmitted disease (STD) testing.  Diabetes screening. This is done by checking your blood sugar (glucose) after you have not eaten for a while (fasting). You may have this done every 1-3 years.  Bone density scan. This is done to screen for osteoporosis. You may have this done starting at age 53.  Mammogram. This may be done every 1-2 years. Talk to your health care provider about how often you should have regular mammograms. Talk with your health care provider about your test results, treatment options, and if necessary, the need for more tests. Vaccines  Your health care provider may recommend certain vaccines, such as:  Influenza vaccine. This is recommended every year.  Tetanus, diphtheria, and acellular pertussis (Tdap, Td)  vaccine. You may need a Td booster every 10 years.  Zoster vaccine. You may need this after age 35.  Pneumococcal 13-valent conjugate (PCV13) vaccine. One dose is recommended after age 32.  Pneumococcal polysaccharide  (PPSV23) vaccine. One dose is recommended after age 60. Talk to your health care provider about which screenings and vaccines you need and how often you need them. This information is not intended to replace advice given to you by your health care provider. Make sure you discuss any questions you have with your health care provider. Document Released: 12/07/2015 Document Revised: 07/30/2016 Document Reviewed: 09/11/2015 Elsevier Interactive Patient Education  2017 [employer] Prevention in the Home Falls can cause injuries. They can happen to people of all ages. There are many things you can do to make your home safe and to help prevent falls. What can I do on the outside of my home?  Regularly fix the edges of walkways and driveways and fix any cracks.  Remove anything that might make you trip as you walk through a door, such as a raised step or threshold.  Trim any bushes or trees on the path to your home.  Use bright outdoor lighting.  Clear any walking paths of anything that might make someone trip, such as rocks or tools.  Regularly check to see if handrails are loose or broken. Make sure that both sides of any steps have handrails.  Any raised decks and porches should have guardrails on the edges.  Have any leaves, snow, or ice cleared regularly.  Use sand or salt on walking paths during winter.  Clean up any spills in your garage right away. This includes oil or grease spills. What can I do in the bathroom?  Use night lights.  Install grab bars by the toilet and in the tub and shower. Do not use towel bars as grab bars.  Use non-skid mats or decals in the tub or shower.  If you need to sit down in the shower, use a plastic, non-slip stool.  Keep the floor dry. Clean up any water that spills on the floor as soon as it happens.  Remove soap buildup in the tub or shower regularly.  Attach bath mats securely with double-sided non-slip rug tape.  Do not have  throw rugs and other things on the floor that can make you trip. What can I do in the bedroom?  Use night lights.  Make sure that you have a light by your bed that is easy to reach.  Do not use any sheets or blankets that are too big for your bed. They should not hang down onto the floor.  Have a firm chair that has side arms. You can use this for support while you get dressed.  Do not have throw rugs and other things on the floor that can make you trip. What can I do in the kitchen?  Clean up any spills right away.  Avoid walking on wet floors.  Keep items that you use a lot in easy-to-reach places.  If you need to reach something above you, use a strong step stool that has a grab bar.  Keep electrical cords out of the way.  Do not use floor polish or wax that makes floors slippery. If you must use wax, use non-skid floor wax.  Do not have throw rugs and other things on the floor that can make you trip. What can I do with my stairs?  Do not leave  any items on the stairs.  Make sure that there are handrails on both sides of the stairs and use them. Fix handrails that are broken or loose. Make sure that handrails are as long as the stairways.  Check any carpeting to make sure that it is firmly attached to the stairs. Fix any carpet that is loose or worn.  Avoid having throw rugs at the top or bottom of the stairs. If you do have throw rugs, attach them to the floor with carpet tape.  Make sure that you have a light switch at the top of the stairs and the bottom of the stairs. If you do not have them, ask someone to add them for you. What else can I do to help prevent falls?  Wear shoes that:  Do not have high heels.  Have rubber bottoms.  Are comfortable and fit you well.  Are closed at the toe. Do not wear sandals.  If you use a stepladder:  Make sure that it is fully opened. Do not climb a closed stepladder.  Make sure that both sides of the stepladder are  locked into place.  Ask someone to hold it for you, if possible.  Clearly mark and make sure that you can see:  Any grab bars or handrails.  First and last steps.  Where the edge of each step is.  Use tools that help you move around (mobility aids) if they are needed. These include:  Canes.  Walkers.  Scooters.  Crutches.  Turn on the lights when you go into a dark area. Replace any light bulbs as soon as they burn out.  Set up your furniture so you have a clear path. Avoid moving your furniture around.  If any of your floors are uneven, fix them.  If there are any pets around you, be aware of where they are.  Review your medicines with your doctor. Some medicines can make you feel dizzy. This can increase your chance of falling. Ask your doctor what other things that you can do to help prevent falls. This information is not intended to replace advice given to you by your health care provider. Make sure you discuss any questions you have with your health care provider. Document Released: 09/06/2009 Document Revised: 04/17/2016 Document Reviewed: 12/15/2014 Elsevier Interactive Patient Education  2017 [employer].

## 2020-06-12 LAB — HEPATIC FUNCTION PANEL
AG Ratio: 1.7 (calc) (ref 1.0–2.5)
ALT: 13 U/L (ref 6–29)
AST: 18 U/L (ref 10–35)
Albumin: 4.5 g/dL (ref 3.6–5.1)
Alkaline phosphatase (APISO): 60 U/L (ref 37–153)
Bilirubin, Direct: 0.1 mg/dL (ref 0.0–0.2)
Globulin: 2.6 g/dL (calc) (ref 1.9–3.7)
Indirect Bilirubin: 0.4 mg/dL (calc) (ref 0.2–1.2)
Total Bilirubin: 0.5 mg/dL (ref 0.2–1.2)
Total Protein: 7.1 g/dL (ref 6.1–8.1)

## 2020-06-12 LAB — CBC WITH DIFFERENTIAL/PLATELET
Absolute Monocytes: 360 cells/uL (ref 200–950)
Basophils Absolute: 41 cells/uL (ref 0–200)
Basophils Relative: 0.9 %
Eosinophils Absolute: 149 cells/uL (ref 15–500)
Eosinophils Relative: 3.3 %
HCT: 39.3 % (ref 35.0–45.0)
Hemoglobin: 13 g/dL (ref 11.7–15.5)
Lymphs Abs: 900 cells/uL (ref 850–3900)
MCH: 30.2 pg (ref 27.0–33.0)
MCHC: 33.1 g/dL (ref 32.0–36.0)
MCV: 91.4 fL (ref 80.0–100.0)
MPV: 10.9 fL (ref 7.5–12.5)
Monocytes Relative: 8 %
Neutro Abs: 3051 cells/uL (ref 1500–7800)
Neutrophils Relative %: 67.8 %
Platelets: 254 10*3/uL (ref 140–400)
RBC: 4.3 10*6/uL (ref 3.80–5.10)
RDW: 12.8 % (ref 11.0–15.0)
Total Lymphocyte: 20 %
WBC: 4.5 10*3/uL (ref 3.8–10.8)

## 2020-06-12 LAB — BASIC METABOLIC PANEL WITH GFR
BUN: 18 mg/dL (ref 7–25)
CO2: 30 mmol/L (ref 20–32)
Calcium: 10.2 mg/dL (ref 8.6–10.4)
Chloride: 101 mmol/L (ref 98–110)
Creat: 0.89 mg/dL (ref 0.60–0.93)
GFR, Est African American: 75 mL/min/{1.73_m2} (ref >=60)
GFR, Est Non African American: 65 mL/min/{1.73_m2} (ref >=60)
Glucose, Bld: 103 mg/dL — ABNORMAL HIGH (ref 65–99)
Potassium: 4.6 mmol/L (ref 3.5–5.3)
Sodium: 140 mmol/L (ref 135–146)

## 2020-06-12 LAB — LIPID PANEL
Cholesterol: 225 mg/dL — ABNORMAL HIGH (ref <200)
HDL: 74 mg/dL (ref >=50)
LDL Cholesterol (Calc): 125 mg/dL (calc) — ABNORMAL HIGH
Non-HDL Cholesterol (Calc): 151 mg/dL (calc) — ABNORMAL HIGH (ref <130)
Total CHOL/HDL Ratio: 3 (calc) (ref <5.0)
Triglycerides: 138 mg/dL (ref <150)

## 2020-06-12 LAB — TSH: TSH: 1.17 mIU/L (ref 0.40–4.50)

## 2020-06-12 NOTE — Progress Notes (Signed)
Normal results except   cholesterol slightly up.  But favorable  ratio

## 2020-06-14 DIAGNOSIS — L988 Other specified disorders of the skin and subcutaneous tissue: Secondary | ICD-10-CM | POA: Diagnosis not present

## 2020-06-14 DIAGNOSIS — D485 Neoplasm of uncertain behavior of skin: Secondary | ICD-10-CM | POA: Diagnosis not present

## 2020-07-04 ENCOUNTER — Encounter: Payer: Self-pay | Admitting: Genetic Counselor

## 2020-07-04 DIAGNOSIS — Z20828 Contact with and (suspected) exposure to other viral communicable diseases: Secondary | ICD-10-CM | POA: Diagnosis not present

## 2020-07-14 DIAGNOSIS — Z20822 Contact with and (suspected) exposure to covid-19: Secondary | ICD-10-CM | POA: Diagnosis not present

## 2020-07-16 ENCOUNTER — Telehealth: Payer: Self-pay | Admitting: Internal Medicine

## 2020-07-16 ENCOUNTER — Other Ambulatory Visit: Payer: Self-pay | Admitting: Internal Medicine

## 2020-07-16 NOTE — Telephone Encounter (Signed)
Called patient and gave her the message from Dr. [name]. Patient verbalized an understanding. 

## 2020-07-16 NOTE — Telephone Encounter (Signed)
She should be good  To  Go out since it has been over 10 days and  Negative testing   (Sometimes the test stays positive   Even if infection is gone but left over viral particles  Make the  test positive )

## 2020-07-16 NOTE — Telephone Encounter (Signed)
Please see message.  Please advise. 

## 2020-07-16 NOTE — Telephone Encounter (Signed)
Pt stated her spouse and her tested on Aug 11th for COVID at [facility]. Spouse tested pos but she tested neg. They told her on the 20th he would be finished. She scheduled a test for the both of them on the 21st with [facility] and she got the results yesterday and she is negative but he is still pos.They did inform her that he could still test pos after having it. She is suppose to go play music tonight and she is not sure if she is safe to go since her husband had COVID on 11th?    Pt can be reached at [phone]

## 2020-07-31 ENCOUNTER — Ambulatory Visit: Payer: Medicare PPO | Attending: Internal Medicine

## 2020-07-31 ENCOUNTER — Ambulatory Visit: Payer: Medicare PPO

## 2020-07-31 DIAGNOSIS — Z23 Encounter for immunization: Secondary | ICD-10-CM

## 2020-07-31 NOTE — Progress Notes (Signed)
   Covid-19 Vaccination Clinic  Name:  NESA DISTEL    MRN: [id number] DOB: [birth year]  07/31/2020  Ms. Trolinger was observed post Covid-19 immunization for 15 minutes without incident. She was provided with Vaccine Information Sheet and instruction to access the V-Safe system.   Ms. Cocker was instructed to call 911 with any severe reactions post vaccine: Marland Kitchen Difficulty breathing  . Swelling of face and throat  . A fast heartbeat  . A bad rash all over body  . Dizziness and weakness

## 2020-08-27 DIAGNOSIS — Z87891 Personal history of nicotine dependence: Secondary | ICD-10-CM | POA: Diagnosis not present

## 2020-08-27 DIAGNOSIS — K219 Gastro-esophageal reflux disease without esophagitis: Secondary | ICD-10-CM | POA: Diagnosis not present

## 2020-08-27 DIAGNOSIS — Z811 Family history of alcohol abuse and dependence: Secondary | ICD-10-CM | POA: Diagnosis not present

## 2020-08-27 DIAGNOSIS — E785 Hyperlipidemia, unspecified: Secondary | ICD-10-CM | POA: Diagnosis not present

## 2020-08-27 DIAGNOSIS — Z853 Personal history of malignant neoplasm of breast: Secondary | ICD-10-CM | POA: Diagnosis not present

## 2020-08-27 DIAGNOSIS — Z91040 Latex allergy status: Secondary | ICD-10-CM | POA: Diagnosis not present

## 2020-08-27 DIAGNOSIS — Z791 Long term (current) use of non-steroidal anti-inflammatories (NSAID): Secondary | ICD-10-CM | POA: Diagnosis not present

## 2020-08-27 DIAGNOSIS — G8929 Other chronic pain: Secondary | ICD-10-CM | POA: Diagnosis not present

## 2020-08-27 DIAGNOSIS — M2550 Pain in unspecified joint: Secondary | ICD-10-CM | POA: Diagnosis not present

## 2020-08-27 DIAGNOSIS — R030 Elevated blood-pressure reading, without diagnosis of hypertension: Secondary | ICD-10-CM | POA: Diagnosis not present

## 2020-08-27 DIAGNOSIS — B001 Herpesviral vesicular dermatitis: Secondary | ICD-10-CM | POA: Diagnosis not present

## 2020-08-27 DIAGNOSIS — Z809 Family history of malignant neoplasm, unspecified: Secondary | ICD-10-CM | POA: Diagnosis not present

## 2020-09-18 ENCOUNTER — Telehealth: Payer: Medicare PPO | Admitting: Internal Medicine

## 2020-09-19 ENCOUNTER — Encounter: Payer: Self-pay | Admitting: Internal Medicine

## 2020-09-19 ENCOUNTER — Telehealth (INDEPENDENT_AMBULATORY_CARE_PROVIDER_SITE_OTHER): Payer: Medicare PPO | Admitting: Internal Medicine

## 2020-09-19 DIAGNOSIS — Z6379 Other stressful life events affecting family and household: Secondary | ICD-10-CM | POA: Diagnosis not present

## 2020-09-19 DIAGNOSIS — G479 Sleep disorder, unspecified: Secondary | ICD-10-CM

## 2020-09-19 NOTE — Progress Notes (Signed)
Virtual Visit via Video Note  I connected with@ on 09/19/20 at 12:30 PM EDT by a video enabled telemedicine application and verified that I am speaking with the correct person using two identifiers. Location patient: home Location provider:work  office Persons participating in the virtual visit: patient, provider  [place] national recommendations  regarding COVID 19 pandemic   video visit is advised over in office visit for this patient.  Patient aware  of the limitations of evaluation and management by telemedicine and  availability of in person appointments. and agreed to proceed.   HPI: [employer] presents for video visit  Fu meds  Given trazodone and other  Sleep issue   Since then she decided not to take cause reread possible side effects  Her husband now has lost function of legs and iis in hospital bed  And now hospic care and caregivers help in day which has helped her  Is doing ok for now   No new  Sx.   ROS: See pertinent positives and negatives per HPI.  Past Medical History:  Diagnosis Date  . BRCA1 positive   . Breast cancer ([place])    Left- Ductal ca SR-, Chemo ([place]) rad rx   . GERD (gastroesophageal reflux disease)   . H/O migraine   . HSV-2 (herpes simplex virus 2) infection   . Hypercholesteremia   . Rheumatoid factor positive    OA   had rheum consult  . Scoliosis   . Sprain of ankle 03/06/2015   revewiwed care exercise and fu if needed    . Tinnitus     Past Surgical History:  Procedure Laterality Date  . BREAST LUMPECTOMY    . LAPAROSCOPIC BILATERAL SALPINGO OOPHERECTOMY  2006   Dr. Joan Flores  . MASTECTOMY Bilateral 2006    Family History  Problem Relation Age of Onset  . Hypertension Mother   . Heart disease Mother   . Stroke Mother   . Arthritis Mother   . Heart attack Brother   . Arthritis Other        maternal side RA cousin  . Kidney disease Other        maternal side  . Diabetes Other        1st degree relative, paternal side  .  Diabetes Father   . Lung cancer Father     Social History   Tobacco Use  . Smoking status: Former Research scientist (life sciences)  . Smokeless tobacco: Never Used  . Tobacco comment: limited in college   Vaping Use  . Vaping Use: Never used  Substance Use Topics  . Alcohol use: Yes    Alcohol/week: 7.0 standard drinks    Types: 7 Glasses of wine per week  . Drug use: No      Current Outpatient Medications:  .  acyclovir (ZOVIRAX) 200 MG capsule, Take 2 capsules by mouth 3 times daily for outbreak., Disp: 60 capsule, Rfl: 0 .  acyclovir ointment (ZOVIRAX) 5 %, Apply 1 application topically 3 (three) times daily as needed., Disp: 30 g, Rfl: 6 .  b complex vitamins tablet, Take 1 tablet by mouth daily., Disp: , Rfl:  .  calcium carbonate (OS-CAL) 600 MG TABS, Take 600 mg by mouth 3 (three) times daily with meals.  , Disp: , Rfl:  .  famotidine (PEPCID) 20 MG tablet, Take 20 mg by mouth 2 (two) times daily., Disp: , Rfl:  .  fish oil-omega-3 fatty acids 1000 MG capsule, Take 1 g by mouth daily.  (  Patient not taking: Reported on 06/11/2020), Disp: , Rfl:  .  lovastatin (MEVACOR) 20 MG tablet, TAKE ONE TABLET BY MOUTH ONE TIME DAILY, Disp: 90 tablet, Rfl: 0 .  Magnesium 300 MG CAPS, Take 1 capsule by mouth daily. , Disp: , Rfl:  .  traZODone (DESYREL) 50 MG tablet, Take 0.5-1 tablets (25-50 mg total) by mouth at bedtime as needed for sleep., Disp: 30 tablet, Rfl: 1 .  VITAMIN A PO, Take by mouth daily., Disp: , Rfl:  .  vitamin E (VITAMIN E) 180 MG (400 UNITS) capsule, Take 400 Units by mouth daily., Disp: , Rfl:   EXAM: BP Readings from Last 3 Encounters:  06/11/20 126/78  06/11/20 126/78  01/18/20 116/84    VITALS per patient if applicable:  GENERAL: alert, oriented, appears well and in no acute distress  HEENT: atraumatic, conjunttiva clear, no obvious abnormalities on inspection of external nose and ears  NECK: normal movements of the head and neck  LUNGS: on inspection no signs of respiratory  distress, breathing rate appears normal, no obvious gross SOB, gasping or wheezing  CV: no obvious cyanosis  MS: moves all visible extremities without noticeable abnormality  PSYCH/NEURO: pleasant and cooperative, no obvious depression or anxiety, speech and thought processing grossly intact Lab Results  Component Value Date   WBC 4.5 06/11/2020   HGB 13.0 06/11/2020   HCT 39.3 06/11/2020   PLT 254 06/11/2020   GLUCOSE 103 (H) 06/11/2020   CHOL 225 (H) 06/11/2020   TRIG 138 06/11/2020   HDL 74 06/11/2020   LDLDIRECT 119.9 12/30/2012   LDLCALC 125 (H) 06/11/2020   ALT 13 06/11/2020   AST 18 06/11/2020   NA 140 06/11/2020   K 4.6 06/11/2020   CL 101 06/11/2020   CREATININE 0.89 06/11/2020   BUN 18 06/11/2020   CO2 30 06/11/2020   TSH 1.17 06/11/2020   HGBA1C 6.0 06/10/2019    ASSESSMENT AND PLAN:  Discussed the following assessment and plan:    ICD-10-CM   1. Sleep disturbance  G47.9   2. Stress due to illness of family member  Z63.79     Counseled.   No meds for now   [facility]  Disc options she will contact us if needed    Expectant management and discussion of plan and treatment with opportunity to ask questions and all were answered. The patient agreed with the plan and demonstrated an understanding of the instructions.   Advised to call back or seek an in-person evaluation if worsening  or having  further concerns . Return if symptoms worsen or fail to improve, for or when due .    Shanon Ace, MD

## 2020-10-01 ENCOUNTER — Other Ambulatory Visit: Payer: Self-pay | Admitting: Internal Medicine

## 2020-12-13 ENCOUNTER — Other Ambulatory Visit: Payer: Self-pay | Admitting: Internal Medicine

## 2021-01-17 ENCOUNTER — Other Ambulatory Visit: Payer: Self-pay | Admitting: Internal Medicine

## 2021-01-21 NOTE — Telephone Encounter (Signed)
Refill request for: Acyclovir 200 mg LR 10/01/20, #60, 0 rf LOF 09/19/20 FOV 06/12/21  Please review and advise.  Thanks.  Marland KitchenDm/cma

## 2021-02-07 ENCOUNTER — Ambulatory Visit: Payer: Medicare Other

## 2021-02-07 ENCOUNTER — Encounter: Payer: Self-pay | Admitting: Family Medicine

## 2021-02-07 ENCOUNTER — Ambulatory Visit (INDEPENDENT_AMBULATORY_CARE_PROVIDER_SITE_OTHER): Payer: Medicare PPO

## 2021-02-07 ENCOUNTER — Ambulatory Visit: Payer: Medicare PPO | Admitting: Family Medicine

## 2021-02-07 ENCOUNTER — Other Ambulatory Visit: Payer: Self-pay

## 2021-02-07 VITALS — BP 132/80 | HR 86 | Ht 61.0 in | Wt 135.0 lb

## 2021-02-07 DIAGNOSIS — G8929 Other chronic pain: Secondary | ICD-10-CM

## 2021-02-07 DIAGNOSIS — M47816 Spondylosis without myelopathy or radiculopathy, lumbar region: Secondary | ICD-10-CM | POA: Diagnosis not present

## 2021-02-07 DIAGNOSIS — M85859 Other specified disorders of bone density and structure, unspecified thigh: Secondary | ICD-10-CM | POA: Diagnosis not present

## 2021-02-07 DIAGNOSIS — R102 Pelvic and perineal pain: Secondary | ICD-10-CM | POA: Diagnosis not present

## 2021-02-07 DIAGNOSIS — M545 Low back pain, unspecified: Secondary | ICD-10-CM

## 2021-02-07 DIAGNOSIS — M4120 Other idiopathic scoliosis, site unspecified: Secondary | ICD-10-CM | POA: Diagnosis not present

## 2021-02-07 DIAGNOSIS — M4186 Other forms of scoliosis, lumbar region: Secondary | ICD-10-CM | POA: Diagnosis not present

## 2021-02-07 IMAGING — DX DG LUMBAR SPINE COMPLETE 4+V
5 series · 5 of 5 positions shown · non-contrast
Comparison: None.

CLINICAL DATA: Pain

EXAM:
LUMBAR SPINE - COMPLETE 4+ VIEW

[l-spine ap]
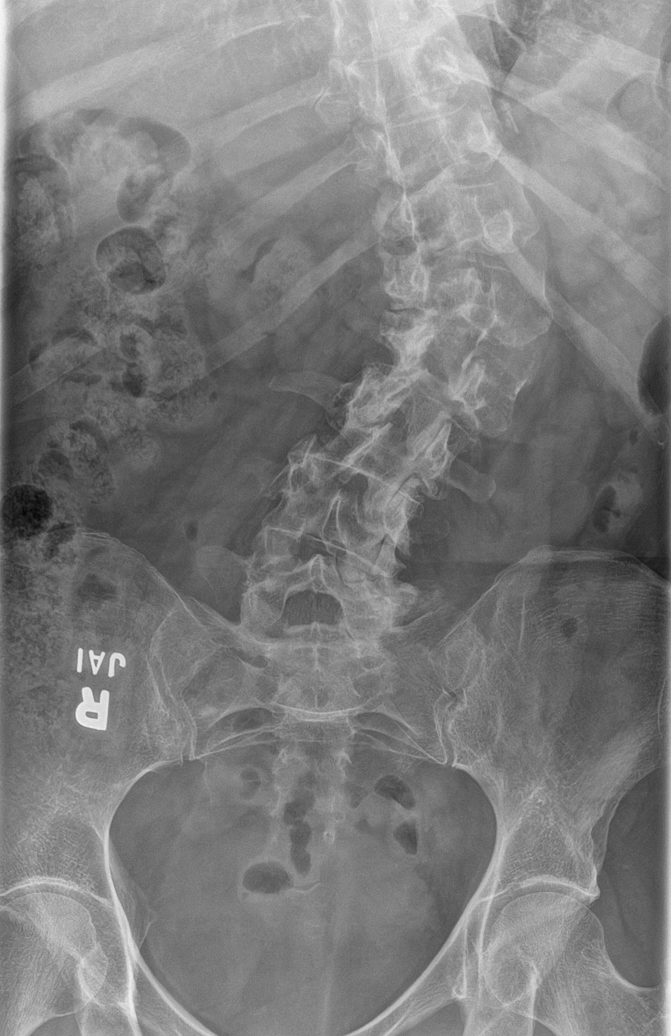

[l-spine obl (1 of 2)]
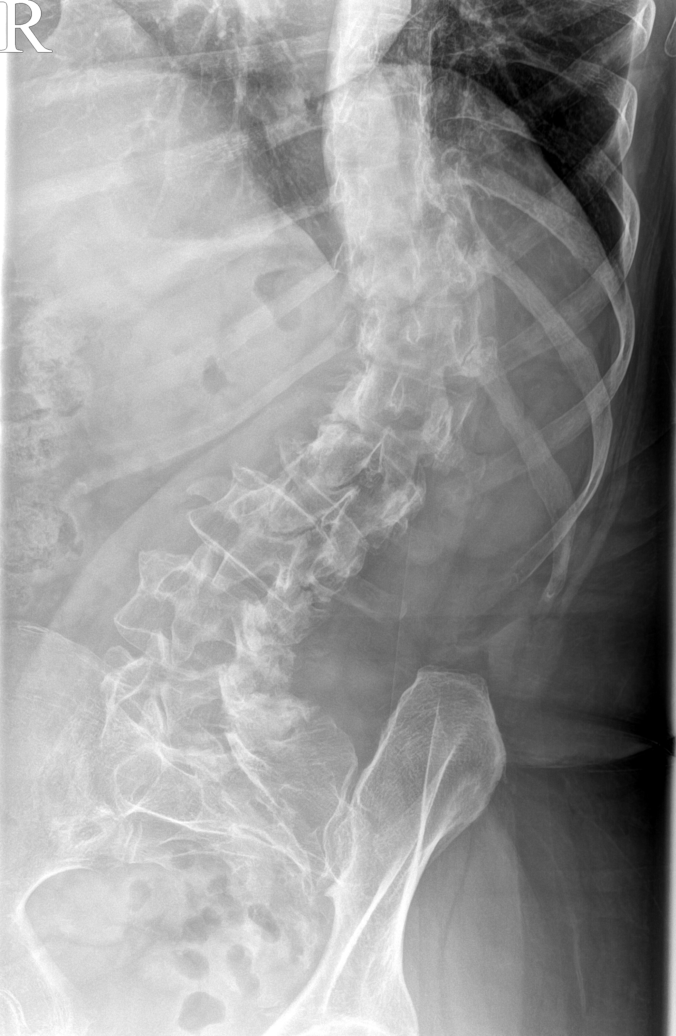

[l-spine obl (2 of 2)]
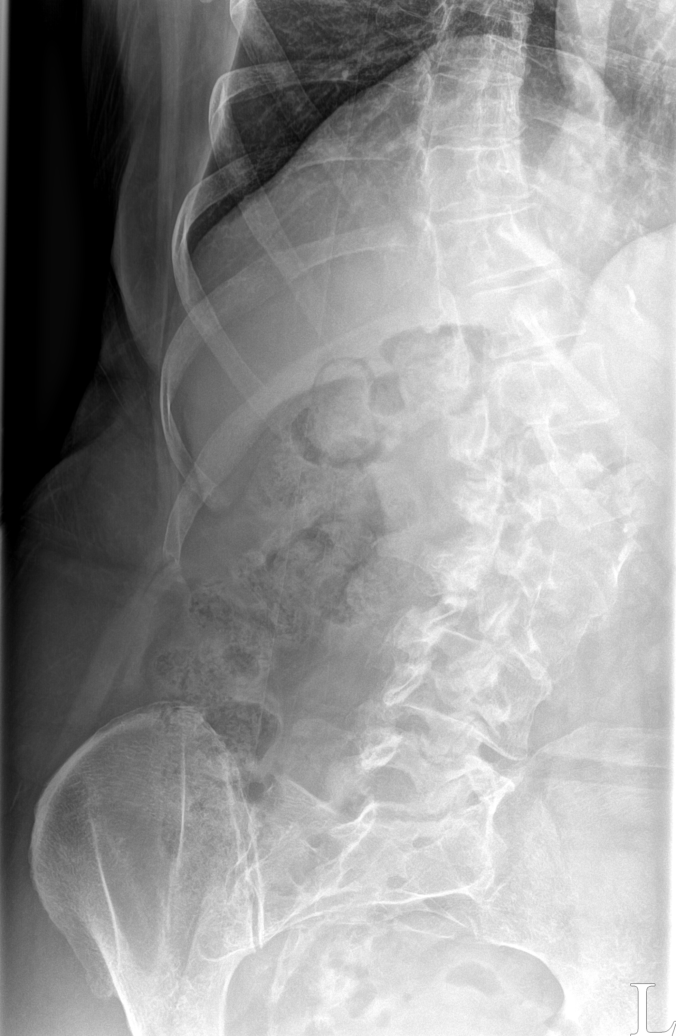

[l-spine lateral]
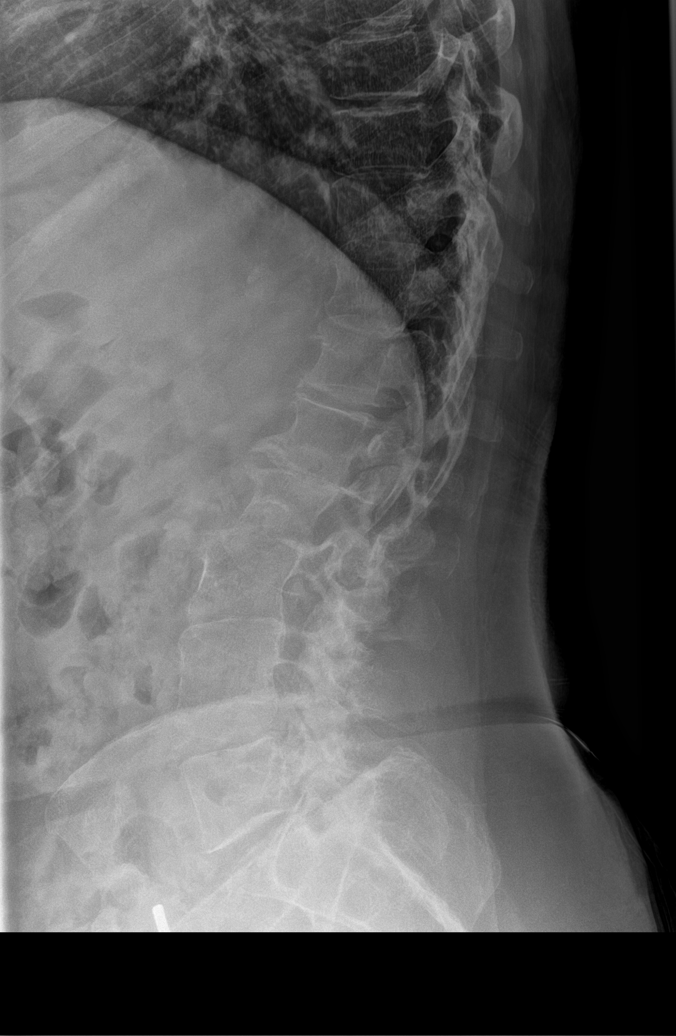

[l-spine spot]
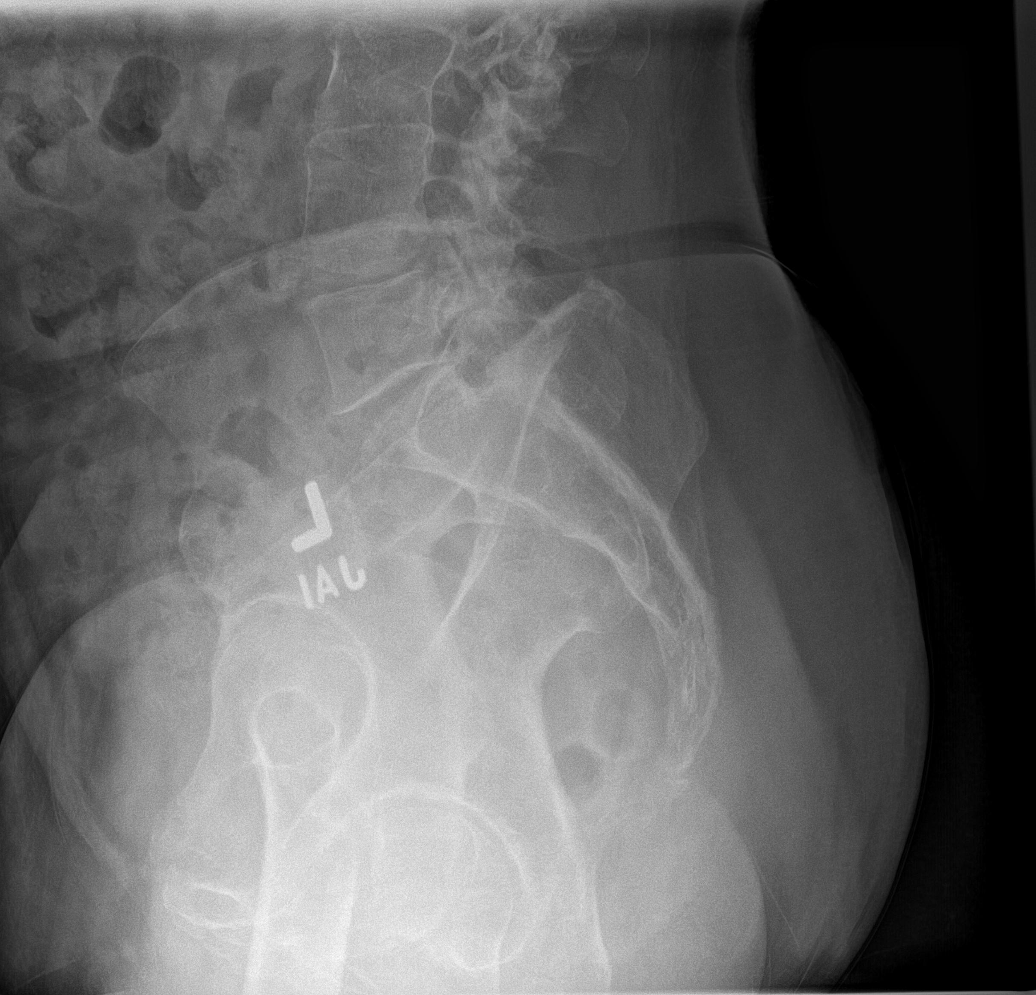

[5 of 5 positions shown; findings below may reference images not displayed]

FINDINGS: There is significant levoscoliosis centered in the upper lumbar
spine. There are advanced degenerative changes of the lumbar spine.
There is no definite acute compression fracture. Facet arthrosis is
noted in the lower lumbar segments.
IMPRESSION: 1. No acute compression fracture.
2. Advanced degenerative changes of the lumbar spine with
significant levoscoliosis.

## 2021-02-07 IMAGING — DX DG PELVIS 1-2V
1 series · 1 of 1 positions shown · non-contrast
Comparison: None.

CLINICAL DATA: Pain

EXAM:
PELVIS - 1-2 VIEW

[pelvis ap]
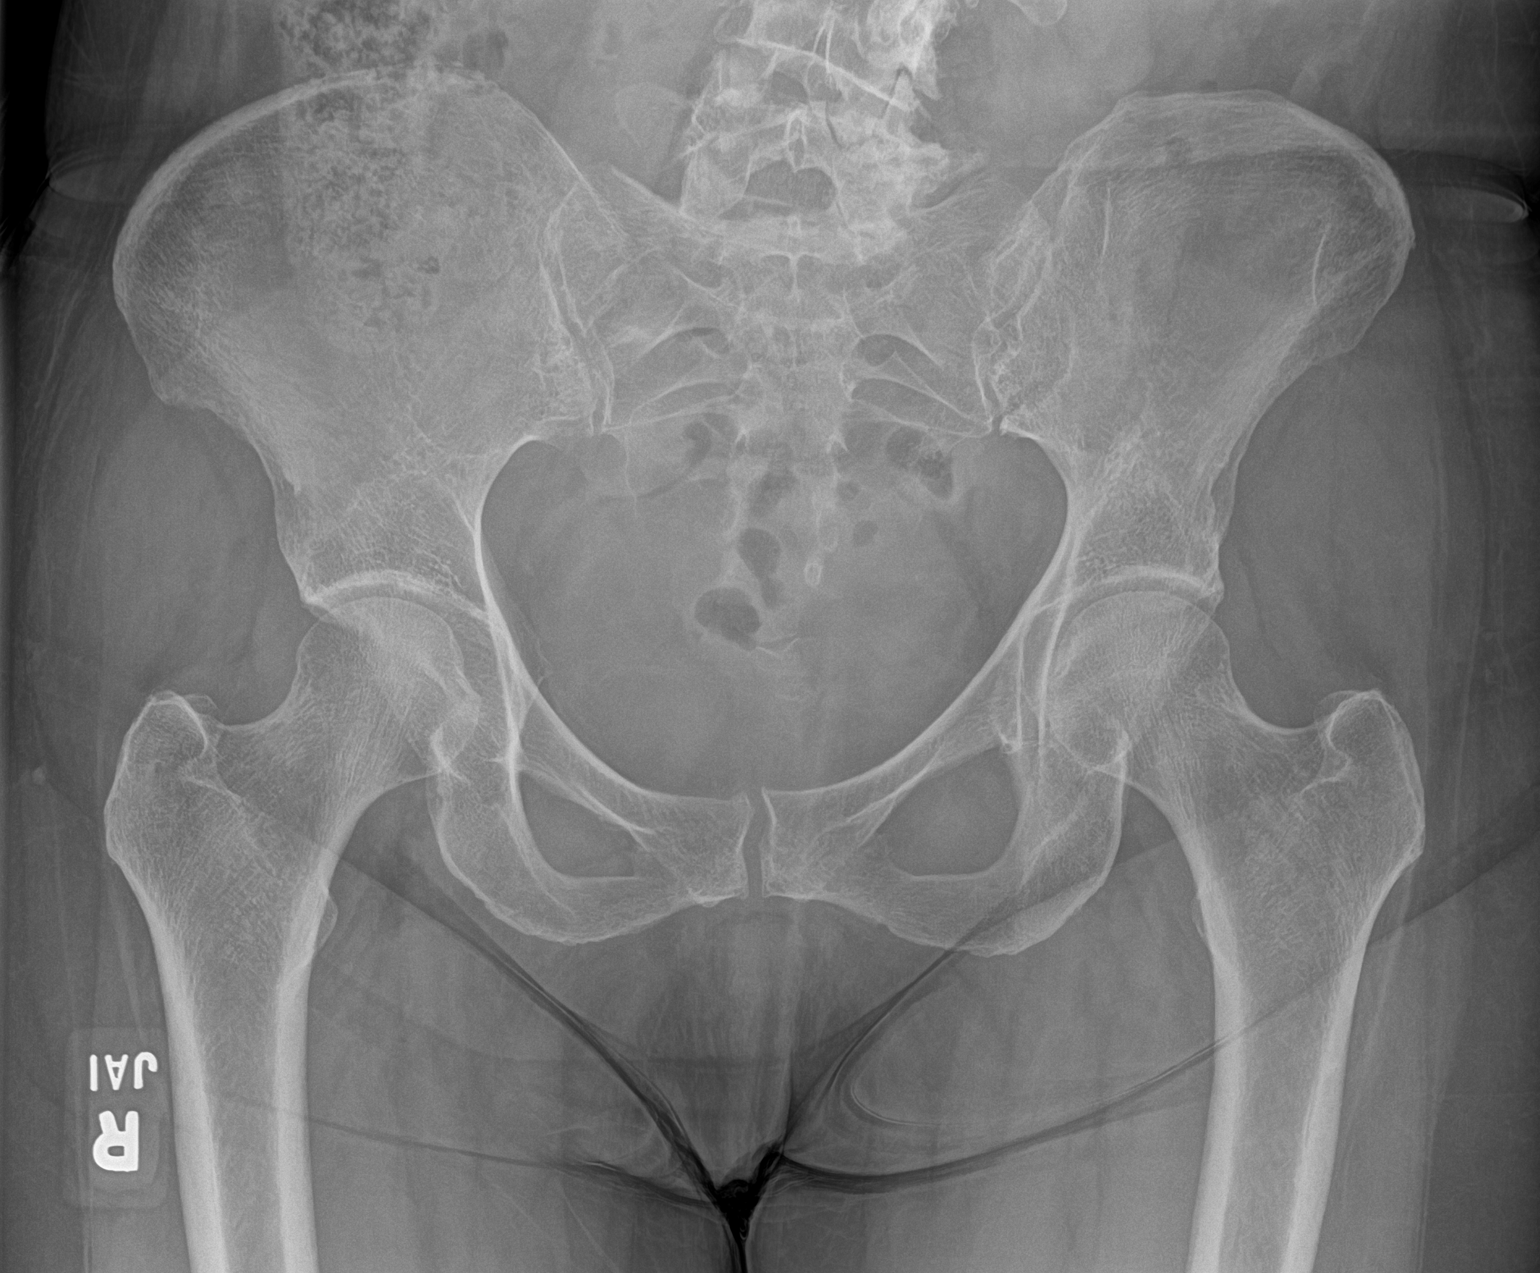

[1 of 1 positions shown; findings below may reference images not displayed]

FINDINGS: There is no evidence of pelvic fracture or diastasis. No pelvic bone
lesions are seen.
IMPRESSION: Negative.

## 2021-02-07 NOTE — Assessment & Plan Note (Signed)
Low back pain.  Patient does have known severe scoliosis.  We will get x-rays to further evaluate.  Patient will be sent to formal physical therapy which I think will be significantly beneficial.  Patient has not had a lot of time to take care of herself with her husband passing away in January.  Will not make any significant medication change follow-up with me again 6 to 8 weeks

## 2021-02-07 NOTE — Progress Notes (Signed)
[facility] [address] [place] Phone: [phone] Subjective:   I Nicole [name] am serving as a Education administrator for Dr. Hulan Saas.  This visit occurred during the SARS-CoV-2 public health emergency.  Safety protocols were in place, including screening questions prior to the visit, additional usage of staff PPE, and extensive cleaning of exam room while observing appropriate contact time as indicated for disinfecting solutions.   I'm seeing this patient by the request  of:  [name], [name], MD  CC: Hip and neck pain  XBL:[id number]   01/18/2020 Chronic problem.  Does have some spinal stenosis and some nerve impingement.  Discussed which activities to do which wants to avoid.  Discussed different medications which patient wants to avoid at the moment.  Worsening pain will consider the possibility injections and nerve root injections that I think will be beneficial.  Follow-up again in 4 to 8 weeks   Update 02/07/2021 Nicole [name] is a 75 y.o. female coming in with complaint of hip and back pain. Patient states she is interested if she needs an MRI to see if her back has more damage. Knows that she may need to decrease activity. Prednisone worked well. States she pinched a nerve after leaning forward band bending to the left side. States she felt a pain in the right lower back/ hip. Had recent xrays at [facility]. Would like education on what exactly about what's going on with her back. States her body is tense all the time and believes maybe the increase in pain has come from her Husband dying in January.  Duration- pain comes and goes  Character- hot sensation Aggravating factors- Reliving factors-  Therapies tried- prednisone, heat   Patient does have an MRI of the knee from 2020 showing the patient did have a complex tear of the lateral meniscus the patient has done well with conservative therapy.  Past Medical History:  Diagnosis Date   . BRCA1 positive   . Breast cancer ([place])    Left- Ductal ca SR-, Chemo ([facility]) rad rx   . GERD (gastroesophageal reflux disease)   . H/O migraine   . HSV-2 (herpes simplex virus 2) infection   . Hypercholesteremia   . Rheumatoid factor positive    OA   had rheum consult  . Scoliosis   . Sprain of ankle 03/06/2015   revewiwed care exercise and fu if needed    . Tinnitus    Past Surgical History:  Procedure Laterality Date  . BREAST LUMPECTOMY    . LAPAROSCOPIC BILATERAL SALPINGO OOPHERECTOMY  2006   Dr. Joan Flores  . MASTECTOMY Bilateral 2006   Social History   Socioeconomic History  . Marital status: Married    Spouse name: Not on file  . Number of children: Not on file  . Years of education: Not on file  . Highest education level: Not on file  Occupational History  . Not on file  Tobacco Use  . Smoking status: Former Research scientist (life sciences)  . Smokeless tobacco: Never Used  . Tobacco comment: limited in college   Vaping Use  . Vaping Use: Never used  Substance and Sexual Activity  . Alcohol use: Yes    Alcohol/week: 7.0 standard drinks    Types: 7 Glasses of wine per week  . Drug use: No  . Sexual activity: Not Currently    Birth control/protection: Post-menopausal  Other Topics Concern  . Not on file  Social History Narrative   Married,  household of 2. 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 Designated Party release signed 04/17/10.      Husband with final stage prostate cancer at this time.   hh of 2    Social Determinants of Health   Financial Resource Strain: Low Risk   . Difficulty of Paying Living Expenses: Not hard at all  Food Insecurity: No Food Insecurity  . Worried About Charity fundraiser in the Last Year: Never true  . Ran Out of Food in the Last Year: Never true  Transportation Needs: No Transportation Needs  . Lack of Transportation (Medical): No  . Lack of Transportation (Non-Medical): No  Physical Activity: Sufficiently Active  .  Days of Exercise per Week: 5 days  . Minutes of Exercise per Session: 60 min  Stress: No Stress Concern Present  . Feeling of Stress : Only a little  Social Connections: Moderately Integrated  . Frequency of Communication with Friends and Family: More than three times a week  . Frequency of Social Gatherings with Friends and Family: Three times a week  . Attends Religious Services: Never  . Active Member of Clubs or Organizations: Yes  . Attends Archivist Meetings: More than 4 times per year  . Marital Status: Married   Allergies  Allergen Reactions  . Sulfonamide Derivatives Nausea And Vomiting  . Latex Rash   Family History  Problem Relation Age of Onset  . Hypertension Mother   . Heart disease Mother   . Stroke Mother   . Arthritis Mother   . Heart attack Brother   . Arthritis Other        maternal side RA cousin  . Kidney disease Other        maternal side  . Diabetes Other        1st degree relative, paternal side  . Diabetes Father   . Lung cancer Father      Current Outpatient Medications (Cardiovascular):  .  lovastatin (MEVACOR) 20 MG tablet, TAKE ONE TABLET BY MOUTH ONE TIME DAILY     Current Outpatient Medications (Other):  .  acyclovir (ZOVIRAX) 200 MG capsule, TAKE TWO CAPSULES BY MOUTH THREE TIMES DAILY FOR OUTBREAKS .  acyclovir ointment (ZOVIRAX) 5 %, Apply 1 application topically 3 (three) times daily as needed. Marland Kitchen  b complex vitamins tablet, Take 1 tablet by mouth daily. .  calcium carbonate (OS-CAL) 600 MG TABS, Take 600 mg by mouth 3 (three) times daily with meals. .  famotidine (PEPCID) 20 MG tablet, Take 20 mg by mouth 2 (two) times daily. .  fish oil-omega-3 fatty acids 1000 MG capsule, Take 1 g by mouth daily. .  Magnesium 300 MG CAPS, Take 1 capsule by mouth daily. Marland Kitchen  VITAMIN A PO, Take by mouth daily. .  vitamin E 180 MG (400 UNITS) capsule, Take 400 Units by mouth daily.   Reviewed prior external information including notes and  imaging from  primary care provider As well as notes that were available from care everywhere and other healthcare systems.  Past medical history, social, surgical and family history all reviewed in electronic medical record.  No pertanent information unless stated regarding to the chief complaint.   Review of Systems:  No headache, visual changes, nausea, vomiting, diarrhea, constipation, dizziness, abdominal pain, skin rash, fevers, chills, night sweats, weight loss, swollen lymph nodes,joint swelling, chest pain, shortness of breath, mood changes. POSITIVE muscle aches, body aches  Objective  Blood pressure 132/80, pulse 86,  height 5' 1"  (1.549 m), weight 135 lb (61.2 kg), last menstrual period 11/24/1997, SpO2 96 %.   General: No apparent distress alert and oriented x3 mood and affect normal, dressed appropriately.  HEENT: Pupils equal, extraocular movements intact  Respiratory: Patient's speak in full sentences and does not appear short of breath  Cardiovascular: No lower extremity edema, non tender, no erythema  Gait mild antalgic   MSK: Significant arthritic changes of multiple joints. Patient back exam does have some degenerative scoliosis.  Patient does have very mild tightness with FABER test right greater than left.  Mild tenderness in the right paraspinal musculature.  Mild increase in discomfort with extension of the back greater than 5 degrees.    Impression and Recommendations:     The above documentation has been reviewed and is accurate and complete Lyndal Pulley, DO

## 2021-02-07 NOTE — Patient Instructions (Addendum)
Good to see you xrays today I think you will do well PT [place] See me again in 6-8 weeks

## 2021-02-07 NOTE — Assessment & Plan Note (Signed)
History of scoliosis.  Patient is doing relatively well overall but will start with formal physical therapy.  Worsening pain will consider the possibility of advanced imaging.  X-rays are pending but I do believe patient will do well with conservative therapy.

## 2021-02-09 ENCOUNTER — Telehealth: Payer: Medicare PPO | Admitting: Internal Medicine

## 2021-02-09 NOTE — Progress Notes (Signed)
  Virtual Visit via Video Note  Number of attempts unable to connect together not answering the phone x3.

## 2021-02-12 NOTE — Progress Notes (Signed)
Chief Complaint  Patient presents with   Headache    Patient complains of headaches, x1 month, Patient states she is feeling pressure behind her eyes, constant pain, sinus issues and light sensitivity. Patient tried Ibuprofen, Acetaminophen and allergy medication with little relief.    Neck Pain    Patient complains of neck pain, started today, Patient states she has shooting pain on the left side of neck and upper left shoulder. Patient also complains of pain in posterior neck.     HPI: Nicole [name] 75 y.o. come in for increasing headaches  Continuous  Wax and wane     12-02-22     Husband died  And since then  Sx getting more obvious  Cheek bones  Face tender  and neck  nurts  Upper .   Has  Had migraines in past   Got better   Wonders if back  Getting cataracts  ? Vision change no diplopia     Gets hot  "Fever in face"  Feeling.  And congestion and runny nose.  ? If sinus infection.    No coughing  Old  Related .  Doesn't think covid no exposures   Sleep  4-8 hours  HA depends  Seems more emotional stress aggravater  ROS: See pertinent positives and negatives per HPI.  Past Medical History:  Diagnosis Date   BRCA1 positive    Breast cancer ([place])    Left- Ductal ca SR-, Chemo ([facility]) rad rx    GERD (gastroesophageal reflux disease)    H/O migraine    HSV-2 (herpes simplex virus 2) infection    Hypercholesteremia    Rheumatoid factor positive    OA   had rheum consult   Scoliosis    Sprain of ankle 03/06/2015   revewiwed care exercise and fu if needed     Tinnitus     Family History  Problem Relation Age of Onset   Hypertension Mother    Heart disease Mother    Stroke Mother    Arthritis Mother    Heart attack Brother    Arthritis Other        maternal side RA cousin   Kidney disease Other        maternal side   Diabetes Other        1st degree relative, paternal side   Diabetes Father    Lung cancer Father     Social History    Socioeconomic History   Marital status: Widowed    Spouse name: Not on file   Number of children: Not on file   Years of education: Not on file   Highest education level: Not on file  Occupational History   Not on file  Tobacco Use   Smoking status: Former Smoker   Smokeless tobacco: Never Used   Tobacco comment: limited in college   Vaping Use   Vaping Use: Never used  Substance and Sexual Activity   Alcohol use: Yes    Alcohol/week: 7.0 standard drinks    Types: 7 Glasses of wine per week   Drug use: No   Sexual activity: Not Currently    Birth control/protection: Post-menopausal  Other Topics Concern   Not on file  Social History Narrative   Married, household of 2. 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 Designated Party release signed 04/17/10.      Husband  prostate cancer at this time. Passed  12/02/20   hh  of 1    Social Determinants of Radio broadcast assistant Strain: Low Risk    Difficulty of Paying Living Expenses: Not hard at all  Food Insecurity: No Food Insecurity   Worried About Charity fundraiser in the Last Year: Never true   Arboriculturist in the Last Year: Never true  Transportation Needs: No Transportation Needs   Lack of Transportation (Medical): No   Lack of Transportation (Non-Medical): No  Physical Activity: Sufficiently Active   Days of Exercise per Week: 5 days   Minutes of Exercise per Session: 60 min  Stress: No Stress Concern Present   Feeling of Stress : Only a little  Social Connections: Moderately Integrated   Frequency of Communication with Friends and Family: More than three times a week   Frequency of Social Gatherings with Friends and Family: Three times a week   Attends Religious Services: Never   Active Member of Clubs or Organizations: Yes   Attends Music therapist: More than 4 times per year   Marital Status: Married    Outpatient Medications Prior to  Visit  Medication Sig Dispense Refill   acyclovir (ZOVIRAX) 200 MG capsule TAKE TWO CAPSULES BY MOUTH THREE TIMES DAILY FOR OUTBREAKS 60 capsule 0   acyclovir ointment (ZOVIRAX) 5 % Apply 1 application topically 3 (three) times daily as needed. 30 g 6   b complex vitamins tablet Take 1 tablet by mouth daily.     calcium carbonate (OS-CAL) 600 MG TABS Take 600 mg by mouth 3 (three) times daily with meals.     famotidine (PEPCID) 20 MG tablet Take 20 mg by mouth 2 (two) times daily.     lovastatin (MEVACOR) 20 MG tablet TAKE ONE TABLET BY MOUTH ONE TIME DAILY 90 tablet 0   Magnesium 300 MG CAPS Take 1 capsule by mouth daily.     VITAMIN A PO Take by mouth daily.     vitamin E 180 MG (400 UNITS) capsule Take 400 Units by mouth daily.     fish oil-omega-3 fatty acids 1000 MG capsule Take 1 g by mouth daily.     No facility-administered medications prior to visit.     EXAM:  BP 138/84 (BP Location: Left Arm, Patient Position: Sitting, Cuff Size: Normal)    Pulse 84    Temp 98.1 F (36.7 C) (Oral)    Ht _0  (1.549 m)    Wt 135 lb 3.2 oz (61.3 kg)    LMP 11/24/1997 (Approximate)    SpO2 97%    BMI 25.55 kg/m   Body mass index is 25.55 kg/m.  GENERAL: vitals reviewed and listed above, alert, oriented, appears well hydrated and in no acute distress HEENT: atraumatic, conjunctiva  clear, no obvious abnormalities on inspection of external nose and ears tender cheek bones no rash   eoms normal  OP : no lesion edema or exudate  NECK: no obvious masses on inspection palpation  LUNGS: clear to auscultation bilaterally, no wheezes, rales or rhonchi, good air movement CV: HRRR, no clubbing cyanosis or  peripheral edema nl cap refill  MS: moves all extremities without noticeable focal  Abnormality NEURO: oriented x 3 CN 3-12 appear intact. No focal muscle weakness or atrophy. DTRs symmetrical. Gait WNL.  Grossly non focal. No tremor or abnormal movement.  PSYCH: pleasant and cooperative,  tired appearing nl speech and  Cognition  Lab Results  Component Value Date   WBC 4.5 06/11/2020   HGB  13.0 06/11/2020   HCT 39.3 06/11/2020   PLT 254 06/11/2020   GLUCOSE 103 (H) 06/11/2020   CHOL 225 (H) 06/11/2020   TRIG 138 06/11/2020   HDL 74 06/11/2020   LDLDIRECT 119.9 12/30/2012   LDLCALC 125 (H) 06/11/2020   ALT 13 06/11/2020   AST 18 06/11/2020   NA 140 06/11/2020   K 4.6 06/11/2020   CL 101 06/11/2020   CREATININE 0.89 06/11/2020   BUN 18 06/11/2020   CO2 30 06/11/2020   TSH 1.17 06/11/2020   HGBA1C 6.0 06/10/2019   BP Readings from Last 3 Encounters:  02/13/21 138/84  02/07/21 132/80  06/11/20 126/78    ASSESSMENT AND PLAN:  Discussed the following assessment and plan:  Worsening headaches - Plan: CT Head Wo Contrast  Headache, unspecified headache type - Plan: CT Head Wo Contrast  Face pain - Plan: CT Head Wo Contrast Poss  Sinusitis plus tension migraine     Ct scan  consider add  rx for sinusitis  Other eval as indicated  Attention to hydration sleep self care in interim    -Patient advised to return or notify health care team  if  new concerns arise.  Patient Instructions  Plan ct of head   depending we may add antibiotic for poss sinusitis   Try to get enough sleep .     [name]. Khadar Monger M.D.

## 2021-02-13 ENCOUNTER — Encounter: Payer: Self-pay | Admitting: Internal Medicine

## 2021-02-13 ENCOUNTER — Ambulatory Visit: Payer: Medicare PPO | Admitting: Internal Medicine

## 2021-02-13 ENCOUNTER — Other Ambulatory Visit: Payer: Self-pay

## 2021-02-13 VITALS — BP 138/84 | HR 84 | Temp 98.1°F | Ht 61.0 in | Wt 135.2 lb

## 2021-02-13 DIAGNOSIS — R519 Headache, unspecified: Secondary | ICD-10-CM | POA: Diagnosis not present

## 2021-02-13 NOTE — Patient Instructions (Signed)
Plan ct of head   depending we may add antibiotic for poss sinusitis   Try to get enough sleep .

## 2021-02-14 NOTE — Telephone Encounter (Signed)
Thanks for the update sometimes you can get a post traumatic headache syndrome takes a while to get better you should be having your CT scan tomorrow. There are medicines to try for posttraumatic headaches until they fade away.

## 2021-02-15 ENCOUNTER — Other Ambulatory Visit: Payer: Self-pay

## 2021-02-15 ENCOUNTER — Ambulatory Visit (INDEPENDENT_AMBULATORY_CARE_PROVIDER_SITE_OTHER)
Admission: RE | Admit: 2021-02-15 | Discharge: 2021-02-15 | Disposition: A | Discharge status: Home / Self Care | Payer: Medicare PPO | Source: Ambulatory Visit | Attending: Internal Medicine | Admitting: Internal Medicine

## 2021-02-15 ENCOUNTER — Other Ambulatory Visit: Payer: Self-pay | Admitting: Internal Medicine

## 2021-02-15 DIAGNOSIS — R519 Headache, unspecified: Secondary | ICD-10-CM

## 2021-02-15 IMAGING — CT CT HEAD W/O CM
3 of 4 series · 16 of 47 positions shown, 19 images · non-contrast
Comparison: [DATE]

CLINICAL DATA: Increasing headache.  History of breast cancer

EXAM:
CT HEAD WITHOUT CONTRAST
TECHNIQUE: Contiguous axial images were obtained from the base of the skull
through the vertex without intravenous contrast.

[Series 3: head 2.0 hr68 · axial · 0.44mm/px · z∈[-153,-23]mm · 10 of 73 slices shown, 13 images]
[im 4/73  brain]
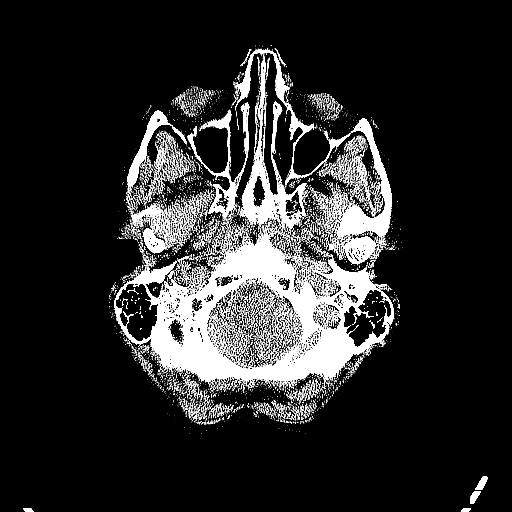
[im 4/73  bone]
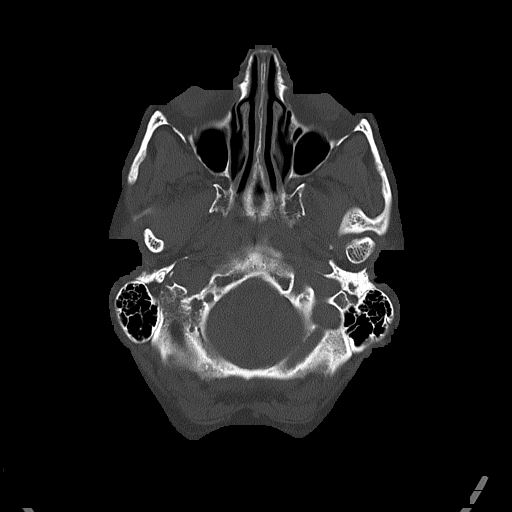
[im 11/73  brain]
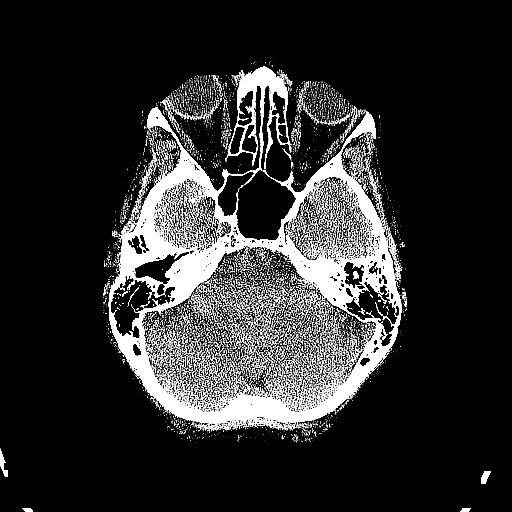
[im 19/73  brain]
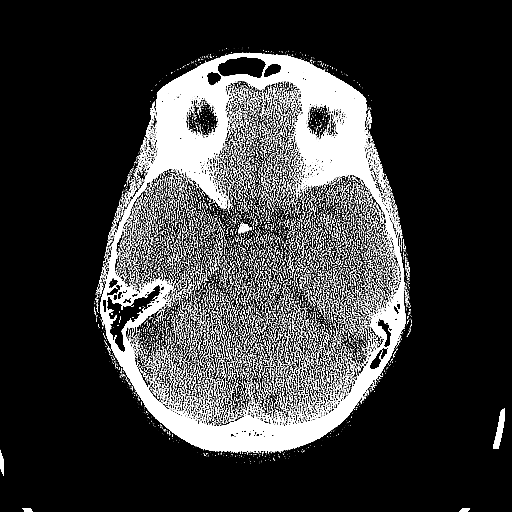
[im 26/73  brain]
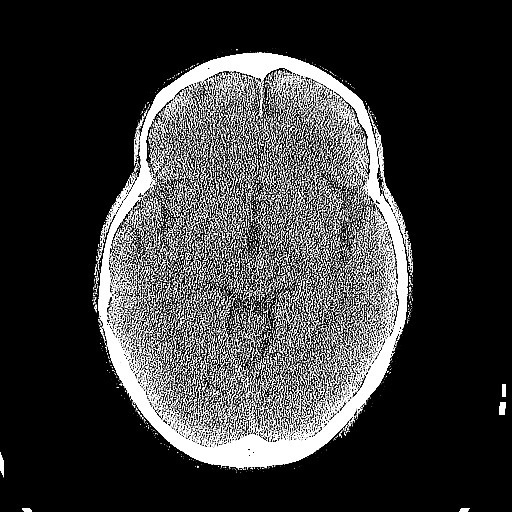
[im 33/73  brain]
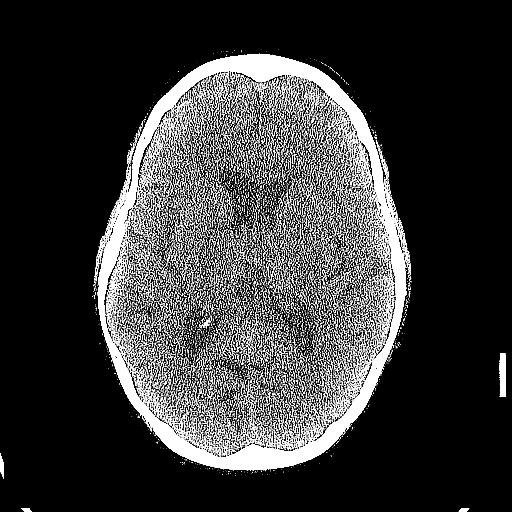
[im 33/73  bone]
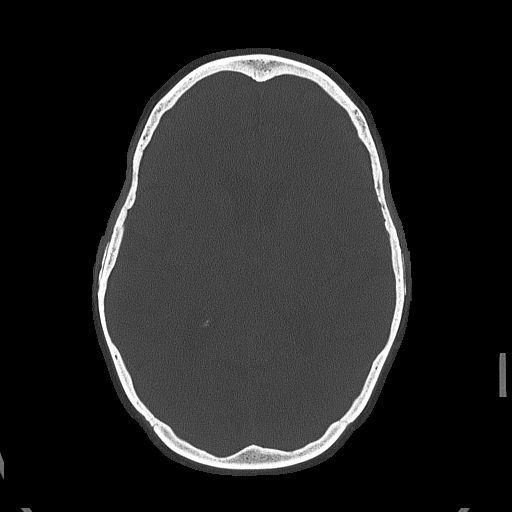
[im 40/73  brain]
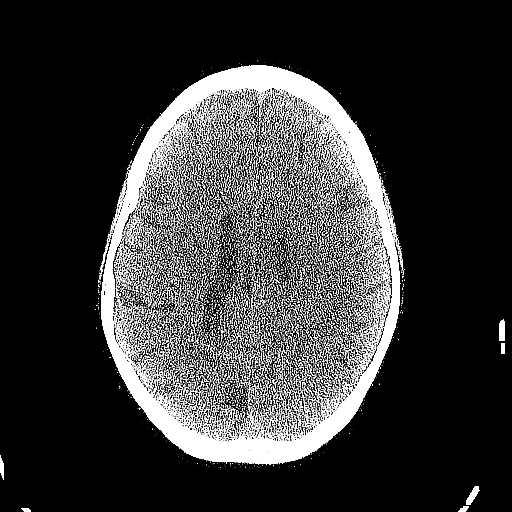
[im 47/73  brain]
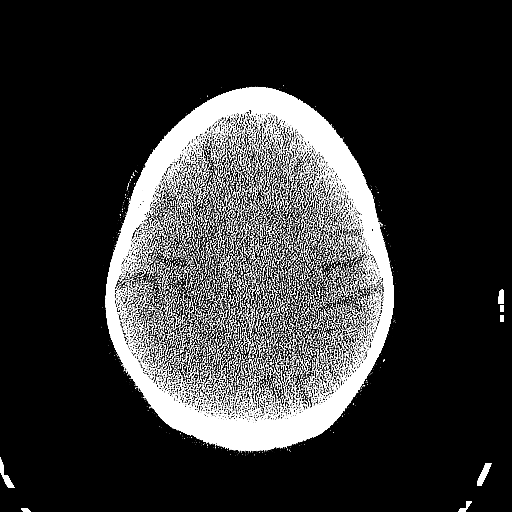
[im 55/73  brain]
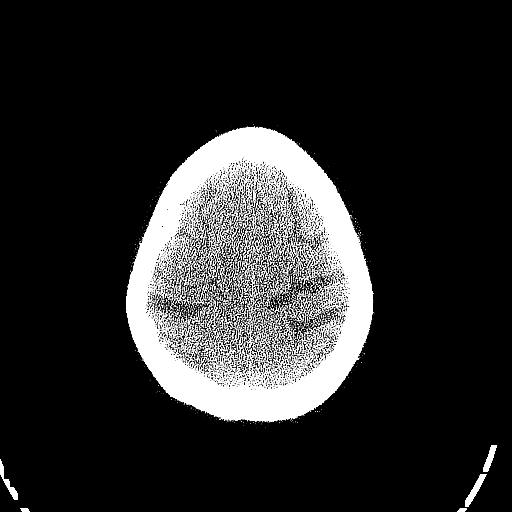
[im 62/73  brain]
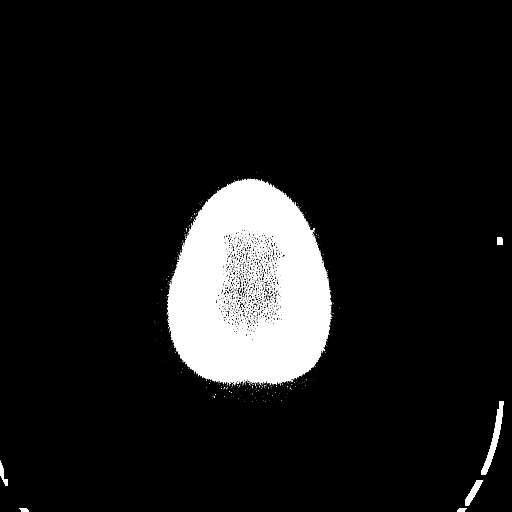
[im 62/73  bone]
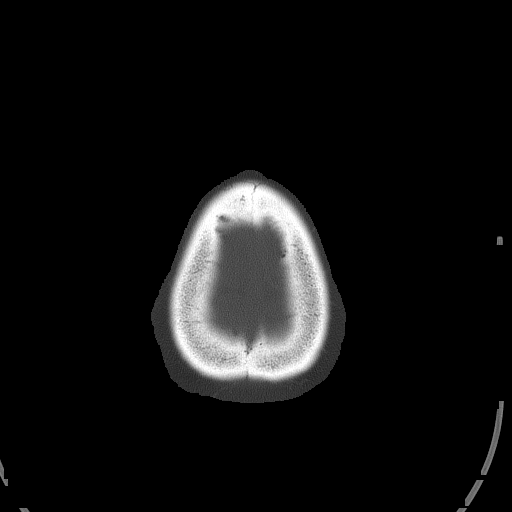
[im 69/73  brain]
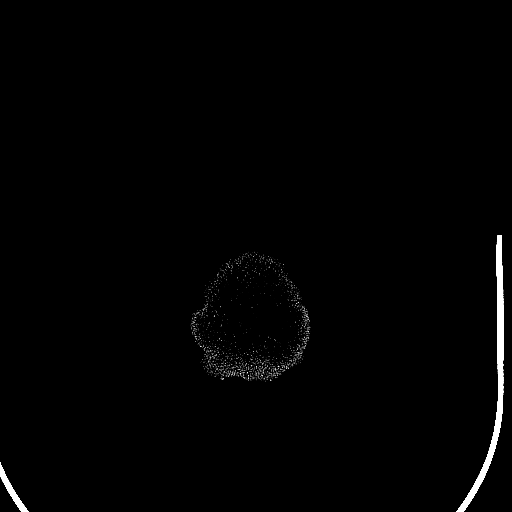

[Series 4: head 3.0 mpr coronal · coronal · 0.30mm/px · 3 of 62 slices shown]
[im 21/62  brain]
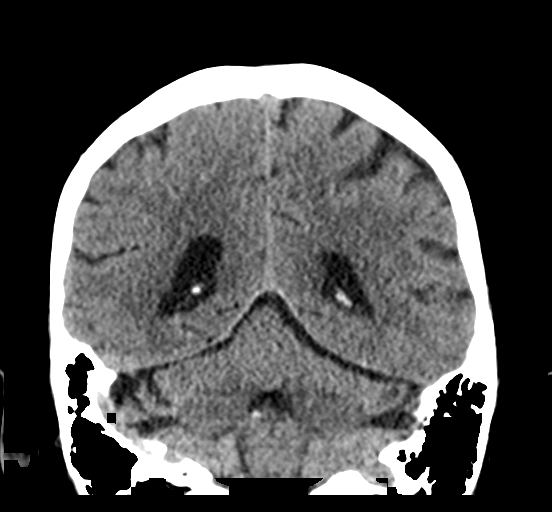
[im 28/62  brain]
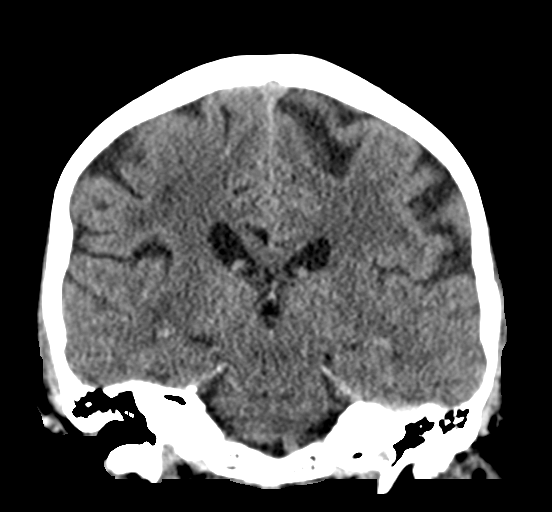
[im 34/62  brain]
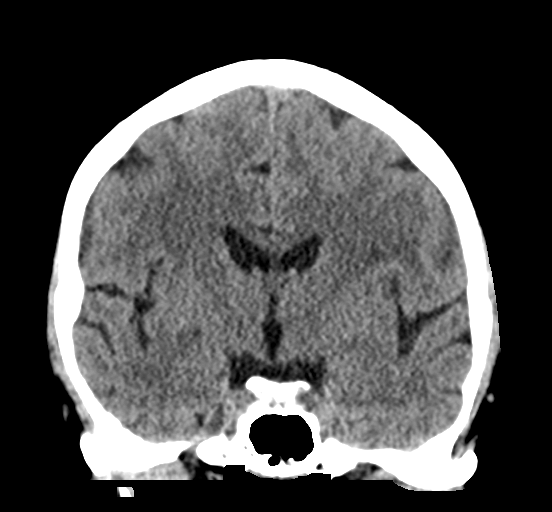

[Series 5: head 3.0 mpr sagittal · sagittal · 0.30mm/px · 3 of 50 slices shown]
[im 17/50  brain]
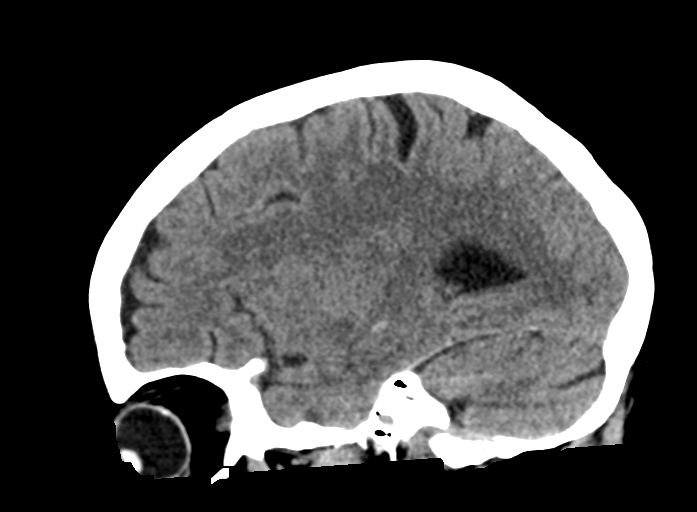
[im 25/50  brain]
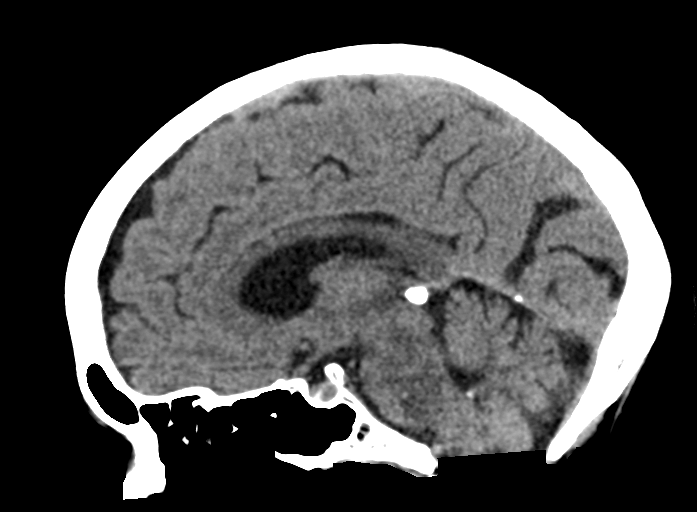
[im 33/50  brain]
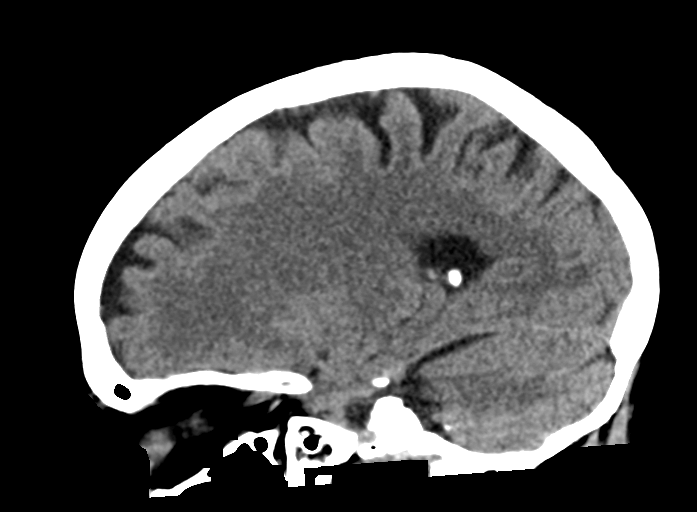

[16 of 47 positions shown; findings below may reference images not displayed]

FINDINGS: Brain: No evidence of acute infarction, hemorrhage, hydrocephalus,
extra-axial collection or mass lesion/mass effect.

Vascular: No hyperdense vessel or unexpected calcification.

Skull: Normal. Negative for fracture or focal lesion.

Sinuses/Orbits: No acute finding.
IMPRESSION: Negative head CT.

## 2021-02-15 MED ORDER — AMITRIPTYLINE HCL 10 MG PO TABS: 10.0000 mg | ORAL_TABLET | Freq: Every day | ORAL | 1 refills | Status: DC

## 2021-02-15 MED FILL — AMITRIPTYLINE HCL 10 MG TAB: 10 | 20 days supply | Qty: 40 | Fill #0

## 2021-02-15 NOTE — Progress Notes (Signed)
See result note.  

## 2021-02-15 NOTE — Progress Notes (Signed)
Good news  ct scan is normal  and sinuses look ok .  So would like to try med we sometimes use of migraine of post traumatic HA rx    take at night  can cause drowsiness but not a narcotic   It is an older med we used to use high dose for depression but seems to help low dose of headaches and nerve pain  and other maladies. Please calendar the headaches   .  Try  Take every night  10 mg    can increase to 20 mg per night if needed .   I sent to [place] fu visit virtual or other in  month and see how things go   .

## 2021-02-19 ENCOUNTER — Ambulatory Visit: Payer: Medicare PPO | Admitting: Family Medicine

## 2021-02-21 ENCOUNTER — Ambulatory Visit: Payer: Medicare PPO | Admitting: Physical Therapy

## 2021-03-04 NOTE — Telephone Encounter (Signed)
Sounds like a reasonable and safe plan.  Thanks for the update.

## 2021-03-06 ENCOUNTER — Encounter: Payer: Self-pay | Admitting: Physical Therapy

## 2021-03-06 ENCOUNTER — Other Ambulatory Visit: Payer: Self-pay

## 2021-03-06 ENCOUNTER — Ambulatory Visit: Payer: Medicare PPO | Attending: Family Medicine | Admitting: Physical Therapy

## 2021-03-06 DIAGNOSIS — R293 Abnormal posture: Secondary | ICD-10-CM | POA: Diagnosis not present

## 2021-03-06 DIAGNOSIS — R252 Cramp and spasm: Secondary | ICD-10-CM | POA: Diagnosis not present

## 2021-03-06 DIAGNOSIS — M4125 Other idiopathic scoliosis, thoracolumbar region: Secondary | ICD-10-CM

## 2021-03-06 NOTE — Patient Instructions (Signed)
Access Code: [email or web address] URL: [email or web address] Date: 03/06/2021 Prepared by: Everardo All  Exercises Supine Single Knee to Chest Stretch - 2 x daily - 7 x weekly - 1 sets - 2 reps - 20s hold Child's Pose with Thread the Needle - 2 x daily - 7 x weekly - 1 sets - 5 reps - 5s hold Sidelying Open Book Thoracic Lumbar Rotation and Extension - 2 x daily - 7 x weekly - 1 sets - 3 reps - 10s hold

## 2021-03-06 NOTE — Therapy (Signed)
[facility] [address] [place], [place], [address] Phone: [phone]   Fax:  [phone]  Physical Therapy Evaluation  Patient Details  Name: Nicole [name] MRN: [id number] Date of Birth: [birth year] Referring Provider (PT): Hulan Saas, MD   Encounter Date: 03/06/2021   PT End of Session - 03/06/21 1654    Visit Number 1    Date for PT Re-Evaluation 05/01/21    Authorization Type [employer]    Progress Note Due on Visit 10    PT Start Time 1455   Patient arriving late   PT Stop Time 1530    PT Time Calculation (min) 35 min    Activity Tolerance Patient tolerated treatment well    Behavior During Therapy [facility] for tasks assessed/performed           Past Medical History:  Diagnosis Date  . BRCA1 positive   . Breast cancer ([place])    Left- Ductal ca SR-, Chemo ([facility]rin[facility]) rad rx   . GERD (gastroesophageal reflux disease)   . H/O migraine   . HSV-2 (herpes simplex virus 2) infection   . Hypercholesteremia   . Rheumatoid factor positive    OA   had rheum consult  . Scoliosis   . Sprain of ankle 03/06/2015   revewiwed care exercise and fu if needed    . Tinnitus     Past Surgical History:  Procedure Laterality Date  . BREAST LUMPECTOMY    . LAPAROSCOPIC BILATERAL SALPINGO OOPHERECTOMY  2006   Dr. Joan Flores  . MASTECTOMY Bilateral 2006    There were no vitals filed for this visit.    Subjective Assessment - 03/06/21 1459    Subjective Had a "pinched nerve" approx 1.5 months ago. States that it felt that a hot poker had stabbed her in the Rt buttock. Presented to after hours care and was prescribed prednisone which helped with the pain within two days. Tries to stay active. Always has pain but chooses to push through. Knows that she has degenerative changes in her back and wants to be sure that she is not currently engaging in activities that will make it worse. States that she has a lot of stress in  her life currently as her husband recently passed and she does not feel that she is handling it well. Plans to start counseling soon.    Pertinent History Hx breast cancer; scoliosis, bilateral hand arthritis    Limitations Lifting;House hold activities    How long can you sit comfortably? unlimited    How long can you stand comfortably? unlimited    How long can you walk comfortably? unlimited    Patient Stated Goals to not worsen degenerative changes in back    Currently in Pain? Yes    Pain Score 5     Pain Location Back    Pain Orientation Lower;Medial    Pain Type Chronic pain    Pain Onset More than a month ago    Pain Frequency Constant              [facility] PT Assessment - 03/06/21 0001      Assessment   Medical Diagnosis M54.50,G89.29 (ICD-10-CM) - Chronic right-sided low back pain, unspecified whether sciatica present    Referring Provider (PT) Hulan Saas, MD    Onset Date/Surgical Date --   chronic   Hand Dominance Right    Next MD Visit None    Prior Therapy Yes - back, Lt knee  Precautions   Precautions None      Restrictions   Weight Bearing Restrictions No      Balance Screen   Has the patient fallen in the past 6 months Yes    How many times? 1    Has the patient had a decrease in activity level because of a fear of falling?  No    Is the patient reluctant to leave their home because of a fear of falling?  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[place], dance, plays Administrator, [employer]   Overall Cognitive Status Within Functional Limits for tasks assessed      Observation/Other Assessments   Focus on Therapeutic Outcomes (FOTO)  to be assessed      Coordination   Gross Motor Movements are Fluid and Coordinated Yes      Posture/Postural Control   Posture Comments increased  thoracic kyphosis; significant scoliotic curvature; bilateral scapular elevation      ROM / Strength   AROM / PROM / Strength AROM;Strength      AROM   Overall AROM Comments lumbar AROM WFL for tasks performed      Strength   Strength Assessment Site Hip;Knee    Right/Left Hip Right;Left    Right Hip Flexion 4/5    Right Hip ADduction 4+/5    Left Hip Flexion 4/5    Left Hip ADduction 4+/5    Right/Left Knee Right;Left    Right Knee Flexion 5/5    Right Knee Extension 5/5    Left Knee Flexion 5/5    Left Knee Extension 5/5                      Objective measurements completed on examination: See above findings.               PT Education - 03/06/21 1637    Education Details Access Code [email or web address]; childs pose with thread the needle, sidelying open book, single knee to chest    Person(s) Educated Patient    Methods Explanation;Demonstration;Tactile cues;Verbal cues;Handout    Comprehension Verbalized understanding;Returned demonstration;Verbal cues required;Tactile cues required            PT Short Term Goals - 03/06/21 1645      PT SHORT TERM GOAL #1   Title Patient will be independent with HEP for continued progression at home.    Time 4    Period Weeks    Status New    Target Date 04/03/21      PT SHORT TERM GOAL #2   Title Patient will lift 10# from floor level using proper mechanics x5 reps without increased pain to more readily complete ADLs    Time 4    Period Weeks    Status New    Target Date 04/03/21             PT Long Term Goals - 03/06/21 1645      PT LONG TERM GOAL #1   Title Patient will be independent with advanced HEP for long term management of symptoms post D/C.    Time 8    Period Weeks    Status New    Target Date 05/01/21  PT LONG TERM GOAL #2   Title Patient will lift 20# from floor level using proper mechanics x5 reps without increased pain to more readily complete ADLs    Time 8    Period Weeks     Status New    Target Date 05/01/21      PT LONG TERM GOAL #3   Title Patient will demonstrate 50% thoracic rotation AROM bilaterally for improved mobility when performing yardwork and other household duties.    Time 8    Period Weeks    Status New    Target Date 05/01/21      PT LONG TERM GOAL #4   Title Patient will improve FOTO goal by 10 points to indicate improved overall function.    Time 8    Period Weeks    Status New    Target Date 05/01/21                  Plan - 03/06/21 1638    Clinical Impression Statement Patient is a 75 y/o female referred due to low back pain. PMH includes history of breast cancer, scoliosis, and bilateral hand arthritis. Patient reports that activity limitations include lifting heavy objects from floor level. Patient is able to demonstrate full lumbar ROM in all planes. She exhibits thoracic AROM impairments bilaterally with regards to side bending. She demonstrates no gross bil LE strength impairments however more testing required as session shortened due to patient's late arrival. Noting use of valsalva manuever when performing bed mobility indicating possible core strength impairments. Would benefit from continued skilled intervention to address impairments for decreased pain to improve functional activity tolerance.    Personal Factors and Comorbidities Comorbidity 3+;Time since onset of injury/illness/exacerbation;Social Background    Comorbidities history of breast cancer with lumpectomy; scoliosis, bilateral hand arthritis    Examination-Activity Limitations Bend;Lift    Examination-Participation Restrictions Cleaning;Yard Work    Stability/Clinical Decision Making Stable/Uncomplicated    Clinical Decision Making Low    Rehab Potential Good    PT Frequency 2x / week    PT Duration 8 weeks    PT Treatment/Interventions ADLs/Self Care Home Management;Cryotherapy;Electrical Stimulation;Iontophoresis 84m/ml Dexamethasone;Moist Heat;Functional  mobility training;Therapeutic activities;Therapeutic exercise;Neuromuscular re-education;Patient/family education;Manual techniques;Passive range of motion;Dry needling;Taping;Spinal Manipulations;Joint Manipulations    PT Next Visit Plan review HEP; introduce DN if patient agreeable; test glute and core strength and begin strengthening as appropriate; continue lumbar/thoracic mobility    PT Home Exercise Plan Access Code [email or web address]    Consulted and Agree with Plan of Care Patient           Patient will benefit from skilled therapeutic intervention in order to improve the following deficits and impairments:  Decreased activity tolerance,Decreased endurance,Decreased mobility,Decreased range of motion,Hypomobility,Increased muscle spasms,Improper body mechanics,Postural dysfunction,Pain  Visit Diagnosis: Abnormal posture - Plan: PT plan of care cert/re-cert  Cramp and spasm - Plan: PT plan of care cert/re-cert  Other idiopathic scoliosis, thoracolumbar region - Plan: PT plan of care cert/re-cert     Problem List Patient Active Problem List   Diagnosis Date Noted  . Acute lateral meniscal tear, left, initial encounter 06/14/2019  . Tear of LCL (lateral collateral ligament) of knee, left, initial encounter 06/14/2019  . BRCA1 positive   . Vitamin D deficiency 04/14/2014  . Osteopenia 04/14/2014  . Encounter for preventive health examination 10/08/2011  . Rheumatoid factor positive   . Chest pain at rest 06/04/2011  . Heart palpitations 06/04/2011  . HSV 08/14/2009  . ARTHRITIS, HANDS, BILATERAL  05/17/2009  . VENOUS INSUFFICIENCY, CHRONIC 06/12/2008  . HYPERLIPIDEMIA 10/13/2007  . GERD 10/13/2007  . UNSPECIFIED ARTHROPATHY MULTIPLE SITES 10/13/2007  . BACK PAIN, LUMBAR 10/13/2007  . OSTEOPENIA 10/13/2007  . Idiopathic scoliosis and kyphoscoliosis 10/13/2007  . HEADACHE 10/13/2007  . ELEVATED BP READING WITHOUT DX HYPERTENSION 10/13/2007  . Personal history of malignant neoplasm  of breast 10/13/2007   Everardo All PT, DPT  03/06/21 4:59 PM    [facility] [address] [place], [place], [address] Phone: [phone]   Fax:  [phone]  Name: Nicole [name] MRN: [id number] Date of Birth: [birth year]

## 2021-03-11 ENCOUNTER — Ambulatory Visit: Payer: Medicare PPO | Admitting: Physical Therapy

## 2021-03-11 ENCOUNTER — Telehealth: Payer: Self-pay | Admitting: Family Medicine

## 2021-03-11 ENCOUNTER — Other Ambulatory Visit: Payer: Self-pay

## 2021-03-11 ENCOUNTER — Encounter: Payer: Self-pay | Admitting: Physical Therapy

## 2021-03-11 DIAGNOSIS — M4125 Other idiopathic scoliosis, thoracolumbar region: Secondary | ICD-10-CM

## 2021-03-11 DIAGNOSIS — R293 Abnormal posture: Secondary | ICD-10-CM | POA: Diagnosis not present

## 2021-03-11 DIAGNOSIS — R252 Cramp and spasm: Secondary | ICD-10-CM

## 2021-03-11 NOTE — Therapy (Addendum)
[facility] [address] [place], [place], [address] Phone: [phone]   Fax:  [phone]  Physical Therapy Treatment  Patient Details  Name: Nicole [name] MRN: [id number] Date of Birth: 09-27-1946 Referring Provider (PT): Hulan Saas, MD   Encounter Date: 03/11/2021   PT End of Session - 03/11/21 1715    Visit Number 2    Date for PT Re-Evaluation 05/01/21    Authorization Type [employer]    Authorization - Visit Number 2    Authorization - Number of Visits 16    Progress Note Due on Visit 10    PT Start Time 2707    PT Stop Time 8675    PT Time Calculation (min) 39 min    Activity Tolerance Patient tolerated treatment well;No increased pain    Behavior During Therapy WFL for tasks assessed/performed           Past Medical History:  Diagnosis Date  . BRCA1 positive   . Breast cancer ([place])    Left- Ductal ca SR-, Chemo ([facility]rin[facility]) rad rx   . GERD (gastroesophageal reflux disease)   . H/O migraine   . HSV-2 (herpes simplex virus 2) infection   . Hypercholesteremia   . Rheumatoid factor positive    OA   had rheum consult  . Scoliosis   . Sprain of ankle 03/06/2015   revewiwed care exercise and fu if needed    . Tinnitus     Past Surgical History:  Procedure Laterality Date  . BREAST LUMPECTOMY    . LAPAROSCOPIC BILATERAL SALPINGO OOPHERECTOMY  2006   Dr. Joan Flores  . MASTECTOMY Bilateral 2006    There were no vitals filed for this visit.   Subjective Assessment - 03/11/21 1708    Subjective Patient reports "thread the needle" exercise caused Rt buttock pain that she had experienced previosly. Took prednisone which helped the pain. Does not want to perform this exercise anymore. Would like to decrease treatment frequency so that she can begin grief counseling.    Pertinent History Hx breast cancer; scoliosis, bilateral hand arthritis    Limitations Lifting;House hold activities    How long  can you sit comfortably? unlimited    How long can you stand comfortably? unlimited    How long can you walk comfortably? unlimited    Patient Stated Goals to not worsen degenerative changes in back    Currently in Pain? Yes   Does not clarify or quantify.   Pain Location Back    Pain Onset More than a month ago    Pain Frequency Constant              [facility] PT Assessment - 03/11/21 0001      Strength   Right Hip Extension 5/5    Right Hip ABduction 4+/5    Left Hip Extension 5/5    Left Hip ABduction 4+/5                         [facility] Adult PT Treatment/Exercise - 03/11/21 0001      Lumbar Exercises: Standing   Shoulder Extension Both;10 reps;Theraband    Theraband Level (Shoulder Extension) Level 3 (Green)    Other Standing Lumbar Exercises bird dog at counter; x5 repetitions bil      Lumbar Exercises: Seated   Other Seated Lumbar Exercises hip flexion 2# at knee; x10 bil LE      Lumbar Exercises: Supine  Pelvic Tilt 10 reps    Pelvic Tilt Limitations with march; 2 x 10 reps    Bridge 10 reps   articulated bridge     Lumbar Exercises: Quadruped   Plank forearm plank; x20s hold with patient fatiguing at 17s    Other Quadruped Lumbar Exercises hip extension x5 bil LE                  PT Education - 03/11/21 1710    Education Details removed thread the needle frome HEP; added plank, quadruped hip ext, pelvic tilt with march, articulated bridge, standing shoulder ext with green tband    Person(s) Educated Patient    Methods Explanation;Demonstration;Tactile cues;Verbal cues;Handout    Comprehension Verbalized understanding;Returned demonstration;Verbal cues required;Tactile cues required            PT Short Term Goals - 03/11/21 1715      PT SHORT TERM GOAL #1   Title Patient will be independent with HEP for continued progression at home.    Time 4    Period Weeks    Status On-going    Target Date 04/03/21             PT Long  Term Goals - 03/06/21 1645      PT LONG TERM GOAL #1   Title Patient will be independent with advanced HEP for long term management of symptoms post D/C.    Time 8    Period Weeks    Status New    Target Date 05/01/21      PT LONG TERM GOAL #2   Title Patient will lift 20# from floor level using proper mechanics x5 reps without increased pain to more readily complete ADLs    Time 8    Period Weeks    Status New    Target Date 05/01/21      PT LONG TERM GOAL #3   Title Patient will demonstrate 50% thoracic rotation AROM bilaterally for improved mobility when performing yardwork and other household duties.    Time 8    Period Weeks    Status New    Target Date 05/01/21      PT LONG TERM GOAL #4   Title Patient will improve FOTO goal by 10 points to indicate improved overall function.    Time 8    Period Weeks    Status New    Target Date 05/01/21                 Plan - 03/11/21 1712    Clinical Impression Statement Patient reporting increased pain when performing thread the needle. Did not attempt sidelying thoracic rotation as she felt that this would be the same. Tactile cues for decreased pelvic rotation when performing quadruped hip extension with patient progressing to self-correction without cuing. Reporting no increased pain with new exercises introduced this date. Would benefit from continued skilled intervention for decreased pain and improved functional activity tolerance.    Personal Factors and Comorbidities Comorbidity 3+;Time since onset of injury/illness/exacerbation;Social Background    Comorbidities history of breast cancer with lumpectomy; scoliosis, bilateral hand arthritis    Examination-Activity Limitations Bend;Lift    Examination-Participation Restrictions Cleaning;Yard Work    Rehab Potential Good    PT Frequency 2x / week    PT Duration 8 weeks    PT Treatment/Interventions ADLs/Self Care Home Management;Cryotherapy;Electrical  Stimulation;Iontophoresis 35m/ml Dexamethasone;Moist Heat;Functional mobility training;Therapeutic activities;Therapeutic exercise;Neuromuscular re-education;Patient/family education;Manual techniques;Passive range of motion;Dry needling;Taping;Spinal Manipulations;Joint Manipulations  PT Next Visit Plan review new HEP; continue core strengthening with progression as tolerated and/or regression as needed    PT Home Exercise Plan Access Code [email or web address]    Consulted and Agree with Plan of Care Patient           Patient will benefit from skilled therapeutic intervention in order to improve the following deficits and impairments:  Decreased activity tolerance,Decreased endurance,Decreased mobility,Decreased range of motion,Hypomobility,Increased muscle spasms,Improper body mechanics,Postural dysfunction,Pain  Visit Diagnosis: Abnormal posture  Cramp and spasm  Other idiopathic scoliosis, thoracolumbar region     Problem List Patient Active Problem List   Diagnosis Date Noted  . Acute lateral meniscal tear, left, initial encounter 06/14/2019  . Tear of LCL (lateral collateral ligament) of knee, left, initial encounter 06/14/2019  . BRCA1 positive   . Vitamin D deficiency 04/14/2014  . Osteopenia 04/14/2014  . Encounter for preventive health examination 10/08/2011  . Rheumatoid factor positive   . Chest pain at rest 06/04/2011  . Heart palpitations 06/04/2011  . HSV 08/14/2009  . ARTHRITIS, HANDS, BILATERAL 05/17/2009  . VENOUS INSUFFICIENCY, CHRONIC 06/12/2008  . HYPERLIPIDEMIA 10/13/2007  . GERD 10/13/2007  . UNSPECIFIED ARTHROPATHY MULTIPLE SITES 10/13/2007  . BACK PAIN, LUMBAR 10/13/2007  . OSTEOPENIA 10/13/2007  . Idiopathic scoliosis and kyphoscoliosis 10/13/2007  . HEADACHE 10/13/2007  . ELEVATED BP READING WITHOUT DX HYPERTENSION 10/13/2007  . Personal history of malignant neoplasm of breast 10/13/2007   Everardo All PT, DPT  03/11/21 5:21 PM   [facility] [address] [place], [place], [address] Phone: [phone]   Fax:  [phone]  Name: NAI BORROMEO MRN: [id number] Date of Birth: 1946/10/06

## 2021-03-11 NOTE — Patient Instructions (Signed)
Exercises Supine Single Knee to Chest Stretch - 2 x daily - 7 x weekly - 1 sets - 2 reps - 20s hold Quadruped Alternating Leg Extensions - 1 x daily - 7 x weekly - 2 sets - 10 reps Standard Plank - 1 x daily - 7 x weekly - 1 sets - 2 reps - 20s hold Supine Bridge with Spinal Articulation - 1 x daily - 7 x weekly - 2 sets - 10 reps Supine March with Posterior Pelvic Tilt - 1 x daily - 7 x weekly - 2 sets - 10 reps Shoulder extension with resistance - Neutral - 1 x daily - 7 x weekly - 2 sets - 10 reps

## 2021-03-11 NOTE — Telephone Encounter (Signed)
Patient called and left a message over the weekend stating that she has started PT and was given an exercise to do where she twists her hips. When she did this at home, it caused her to have significant pain about 30 minutes later. She said it was similar to the pain she had in the beginning.  She still had prednisone at home and started taking that. She wanted to know if it was discussed with PT that she should not be "twisting"?  Please advise.

## 2021-03-12 NOTE — Telephone Encounter (Signed)
Stop the twisting for now and discuss with PT

## 2021-03-12 NOTE — Telephone Encounter (Signed)
Left message for patient

## 2021-03-18 ENCOUNTER — Encounter: Payer: Medicare PPO | Admitting: Physical Therapy

## 2021-03-20 ENCOUNTER — Encounter: Payer: Self-pay | Admitting: Physical Therapy

## 2021-03-20 ENCOUNTER — Ambulatory Visit: Payer: Medicare PPO | Admitting: Physical Therapy

## 2021-03-20 ENCOUNTER — Other Ambulatory Visit: Payer: Self-pay

## 2021-03-20 DIAGNOSIS — R293 Abnormal posture: Secondary | ICD-10-CM | POA: Diagnosis not present

## 2021-03-20 DIAGNOSIS — R252 Cramp and spasm: Secondary | ICD-10-CM

## 2021-03-20 DIAGNOSIS — M4125 Other idiopathic scoliosis, thoracolumbar region: Secondary | ICD-10-CM

## 2021-03-20 NOTE — Therapy (Signed)
[facility] [address], [place], [place], [address] Phone: [phone]   Fax:  [phone]  Physical Therapy Treatment  Patient Details  Name: Nicole [name] MRN: [id number] Date of Birth: 1946/10/31 Referring Provider (PT): Hulan Saas, MD   Encounter Date: 03/20/2021   PT End of Session - 03/20/21 0910    Visit Number 3    Date for PT Re-Evaluation 05/01/21    Authorization Type [employer]    Authorization - Visit Number 3    Authorization - Number of Visits 16    Progress Note Due on Visit 10    PT Start Time 0848    PT Stop Time 0908    PT Time Calculation (min) 20 min    Activity Tolerance Patient tolerated treatment well;No increased pain    Behavior During Therapy WFL for tasks assessed/performed           Past Medical History:  Diagnosis Date  . BRCA1 positive   . Breast cancer ([place])    Left- Ductal ca SR-, Chemo (Magrinat) rad rx   . GERD (gastroesophageal reflux disease)   . H/O migraine   . HSV-2 (herpes simplex virus 2) infection   . Hypercholesteremia   . Rheumatoid factor positive    OA   had rheum consult  . Scoliosis   . Sprain of ankle 03/06/2015   revewiwed care exercise and fu if needed    . Tinnitus     Past Surgical History:  Procedure Laterality Date  . BREAST LUMPECTOMY    . LAPAROSCOPIC BILATERAL SALPINGO OOPHERECTOMY  2006   Dr. Joan Flores  . MASTECTOMY Bilateral 2006    There were no vitals filed for this visit.   Subjective Assessment - 03/20/21 0914    Subjective Patient states that this will be the last visit as she feels that grief counseling is most important. Had hypertensive incidence Monday prompting her friend to contact EMS. Patient further endorsing diaphoresis and being light headed. She did not require transport to the hospital. Feels that this occured due to increased heat and not properly hydrating.    Pertinent History Hx breast cancer; scoliosis,  bilateral hand arthritis    Limitations Lifting;[name] hold activities    How long can you sit comfortably? unlimited    How long can you stand comfortably? unlimited    How long can you walk comfortably? unlimited    Patient Stated Goals to not worsen degenerative changes in back    Currently in Pain? No/denies    Pain Type Chronic pain    Pain Onset More than a month ago              [facility] PT Assessment - 03/20/21 0001      Assessment   Medical Diagnosis M54.50,G89.29 (ICD-10-CM) - Chronic right-sided low back pain, unspecified whether sciatica present    Referring Provider (PT) Hulan Saas, MD    Onset Date/Surgical Date --   Chronic   Hand Dominance Right    Next MD Visit None    Prior Therapy Yes - back, Lt knee      Precautions   Precautions None      Restrictions   Weight Bearing Restrictions No      Balance Screen   Has the patient fallen in the past 6 months Yes    How many times? 1    Has the patient had a decrease in activity level because of a fear of falling?  No    Is the patient reluctant to leave their home because of a fear of falling?  No      Prior Function   Level of Independence Independent    Vocation Retired    Leisure Garden, dance, plays Administrator, [employer]   Overall Cognitive Status Within Functional Limits for tasks assessed      Coordination   Gross Motor Movements are Fluid and Coordinated Yes      Posture/Postural Control   Posture Comments scoliosis, increased thoracic kyphosis      AROM   Overall AROM Comments 25% thoracic rotation bilaterally                         [facility] Adult PT Treatment/Exercise - 03/20/21 0001      Lumbar Exercises: Standing   Other Standing Lumbar Exercises knee level and floor level lifts; 10# x 5 reps each; 15# x 5 reps each; 20# x5 reps each                  PT Education - 03/20/21 0920    Education Details education for preventing further spine injury to include no  lumbar rotation in flexed position, not rotating while loaded with heavy weight, and avoiding max rotation    Person(s) Educated Patient    Methods Explanation;Demonstration    Comprehension Verbalized understanding;Returned demonstration            PT Short Term Goals - 03/20/21 0853      PT SHORT TERM GOAL #1   Title Patient will be independent with HEP for continued progression at home.    Time 4    Period Weeks    Status Achieved    Target Date 04/03/21      PT SHORT TERM GOAL #2   Title Patient will lift 10# from floor level using proper mechanics x5 reps without increased pain to more readily complete ADLs    Time 4    Period Weeks    Status Achieved   demonstrating 10#, 15#, and 20# lift from floor level   Target Date 04/03/21             PT Long Term Goals - 03/20/21 0909      PT LONG TERM GOAL #1   Title Patient will be independent with advanced HEP for long term management of symptoms post D/C.    Time 8    Period Weeks    Status Achieved      PT LONG TERM GOAL #2   Title Patient will lift 20# from floor level using proper mechanics x5 reps without increased pain to more readily complete ADLs    Time 8    Period Weeks    Status Achieved      PT LONG TERM GOAL #3   Title Patient will demonstrate 50% thoracic rotation AROM bilaterally for improved mobility when performing yardwork and other household duties.    Time 8    Period Weeks    Status Not Met   25% thoracic rotation bilaterally                Plan - 03/20/21 0916    Clinical Impression Statement Patient is a 75 y/o female referred due to chronic Rt sided low back pain. PMH includes history of breast cancer with lumpectomy, scoiliosis, and bilateral hand arthritis. Patient completing three visits in this episode of care thus progression towards  goals limited. Requiring moderate verbal and tactile cues for proper lifting mechanics this date however patient able to demonstrate independently by  end of session. Thoracicr rotation AROM continues to be limited bilaterally which can partially be contributed to chronic scoliotic curvature. Patient able to [facility] education for preventing further spine injury to include no lumbar rotation in flexed position, not rotating while loaded with heavy weight, and avoiding max rotation. Safe to D/C to HEP with PT happy to reassess should patient status change.    Personal Factors and Comorbidities Comorbidity 3+;Time since onset of injury/illness/exacerbation;Social Background    Comorbidities history of breast cancer with lumpectomy; scoliosis, bilateral hand arthritis    Examination-Activity Limitations Bend;Lift    Examination-Participation Restrictions Cleaning;Yard Work    Rehab Potential Good    PT Frequency 1x / week    PT Duration 8 weeks    PT Treatment/Interventions ADLs/Self Care Home Management;Cryotherapy;Electrical Stimulation;Iontophoresis 60m/ml Dexamethasone;Moist Heat;Functional mobility training;Therapeutic activities;Therapeutic exercise;Neuromuscular re-education;Patient/family education;Manual techniques;Passive range of motion;Dry needling;Taping;Spinal Manipulations;Joint Manipulations    PT Next Visit Plan D/C to HEP    PT Home Exercise Plan Access Code [email or web address]    Consulted and Agree with Plan of Care Patient           Patient will benefit from skilled therapeutic intervention in order to improve the following deficits and impairments:  Decreased activity tolerance,Decreased endurance,Decreased mobility,Decreased range of motion,Hypomobility,Increased muscle spasms,Improper body mechanics,Postural dysfunction,Pain  Visit Diagnosis: Abnormal posture  Cramp and spasm  Other idiopathic scoliosis, thoracolumbar region     Problem List Patient Active Problem List   Diagnosis Date Noted  . Acute lateral meniscal tear, left, initial encounter 06/14/2019  . Tear of LCL (lateral collateral ligament) of knee, left,  initial encounter 06/14/2019  . BRCA1 positive   . Vitamin D deficiency 04/14/2014  . Osteopenia 04/14/2014  . Encounter for preventive health examination 10/08/2011  . Rheumatoid factor positive   . Chest pain at rest 06/04/2011  . Heart palpitations 06/04/2011  . HSV 08/14/2009  . ARTHRITIS, HANDS, BILATERAL 05/17/2009  . VENOUS INSUFFICIENCY, CHRONIC 06/12/2008  . HYPERLIPIDEMIA 10/13/2007  . GERD 10/13/2007  . UNSPECIFIED ARTHROPATHY MULTIPLE SITES 10/13/2007  . BACK PAIN, LUMBAR 10/13/2007  . OSTEOPENIA 10/13/2007  . Idiopathic scoliosis and kyphoscoliosis 10/13/2007  . HEADACHE 10/13/2007  . ELEVATED BP READING WITHOUT DX HYPERTENSION 10/13/2007  . Personal history of malignant neoplasm of breast 10/13/2007   KEverardo AllPT, DPT  03/20/21 9:22 AM   [facility] [address] [place] [address]Phone: [phone]  Fax:  [phone] Name: Nicole BARBMRN: [id number] of Birth: 802/29/1947 PHYSICAL THERAPY DISCHARGE SUMMARY  Visits from Start of Care: 3  Current functional level related to goals / functional outcomes: See above   Remaining deficits: See above   Education / Equipment: See above  Plan: Patient agrees to discharge.  Patient goals were partially met. Patient is being discharged due to the patient's request.  ?????

## 2021-03-20 NOTE — Telephone Encounter (Signed)
Sorry you had an episode of feeling bad .  Feeling better Advise follow-up visit next week. If symptoms recur Nicole [name] seek emergency room care for further evaluation in interim.  And yes stay hydrated.

## 2021-03-21 ENCOUNTER — Ambulatory Visit: Payer: Medicare PPO | Admitting: Family Medicine

## 2021-03-21 ENCOUNTER — Telehealth: Payer: Medicare PPO | Admitting: Internal Medicine

## 2021-03-25 ENCOUNTER — Encounter: Payer: Medicare PPO | Admitting: Physical Therapy

## 2021-03-27 ENCOUNTER — Other Ambulatory Visit: Payer: Self-pay | Admitting: Internal Medicine

## 2021-03-27 ENCOUNTER — Encounter: Payer: Medicare PPO | Admitting: Physical Therapy

## 2021-04-01 ENCOUNTER — Encounter: Payer: Medicare PPO | Admitting: Physical Therapy

## 2021-04-03 ENCOUNTER — Encounter: Payer: Medicare PPO | Admitting: Physical Therapy

## 2021-04-09 ENCOUNTER — Encounter: Payer: Medicare PPO | Admitting: Physical Therapy

## 2021-04-11 ENCOUNTER — Encounter: Payer: Medicare PPO | Admitting: Physical Therapy

## 2021-04-15 ENCOUNTER — Encounter: Payer: Medicare PPO | Admitting: Physical Therapy

## 2021-04-17 ENCOUNTER — Encounter: Payer: Medicare PPO | Admitting: Physical Therapy

## 2021-04-23 ENCOUNTER — Encounter: Payer: Medicare PPO | Admitting: Physical Therapy

## 2021-04-23 DIAGNOSIS — H04123 Dry eye syndrome of bilateral lacrimal glands: Secondary | ICD-10-CM | POA: Diagnosis not present

## 2021-04-23 DIAGNOSIS — H43813 Vitreous degeneration, bilateral: Secondary | ICD-10-CM | POA: Diagnosis not present

## 2021-04-23 DIAGNOSIS — H2513 Age-related nuclear cataract, bilateral: Secondary | ICD-10-CM | POA: Diagnosis not present

## 2021-04-23 DIAGNOSIS — H524 Presbyopia: Secondary | ICD-10-CM | POA: Diagnosis not present

## 2021-04-25 ENCOUNTER — Encounter: Payer: Medicare PPO | Admitting: Physical Therapy

## 2021-05-15 ENCOUNTER — Other Ambulatory Visit: Payer: Self-pay | Admitting: Internal Medicine

## 2021-05-15 DIAGNOSIS — Z9011 Acquired absence of right breast and nipple: Secondary | ICD-10-CM | POA: Diagnosis not present

## 2021-05-15 DIAGNOSIS — C50912 Malignant neoplasm of unspecified site of left female breast: Secondary | ICD-10-CM | POA: Diagnosis not present

## 2021-06-05 DIAGNOSIS — Z9011 Acquired absence of right breast and nipple: Secondary | ICD-10-CM | POA: Diagnosis not present

## 2021-06-05 DIAGNOSIS — C50912 Malignant neoplasm of unspecified site of left female breast: Secondary | ICD-10-CM | POA: Diagnosis not present

## 2021-06-11 ENCOUNTER — Other Ambulatory Visit: Payer: Self-pay

## 2021-06-11 NOTE — Progress Notes (Signed)
Chief Complaint  Patient presents with   Annual Exam     HPI: Nicole [name] 75 y.o. comes in today for Preventive Medicare exam/ wellness visit and medication evaluationSince last visit. No major change health gets sinus sx   , tender right infra auricular area  but no fever.  Does have tmj condition  No fever  Didn't take the elavil  using self  awareness and actins to help , since  husbands passing  BRCA gene  has    Had neg insurance stool test last year  no fam hx   wasn't a cologuard  Needs refill statin and acyclovir  Taking calcium tida and magnesium  Interested in ca 125 because of  risk   Health Maintenance  Topic Date Due   COVID-19 Vaccine (4 - Booster for Moderna series) 10/30/2020   COLONOSCOPY (Pts 45-22yr Insurance coverage will need to be confirmed)  06/18/2021   INFLUENZA VACCINE  06/24/2021   TETANUS/TDAP  04/14/2024   DEXA SCAN  Completed   Hepatitis C Screening  Completed   PNA vac Low Risk Adult  Completed   Zoster Vaccines- Shingrix  Completed   HPV VACCINES  Aged Out   Health Maintenance Review LIFESTYLE:  Exercise:  yes wwalking  Tobacco/ETS:n Alcohol: 1-2 max  Sugar beverages:n Sleep: interrupted working  Drug use: no HH: husband passed earlier this year   Hearing: ok Vision:  No limitations at present . Last eye check UTD Safety:  Has smoke detector and wears seat belts. o excess sun exposure. Sees dentist regularly. Memory: Felt to be good  , no concern from her or her family. Depression: No anhedonia unusual crying or depressive symptoms Nutrition: Eats well balanced diet; adequate calcium and vitamin D. No swallowing chewing problems. Injury: no major injuries in the last six months. Other healthcare providers:  Reviewed today . Preventive parameters: up-to-date  Reviewed  ADLS:   There are no problems or need for assistance  driving, feeding, obtaining food, dressing, toileting and bathing, managing money using phone. She is  independent.    ROS:  REST of 12 system review negative except as per HPI   Past Medical History:  Diagnosis Date   BRCA1 positive    [place]    Left- Ductal ca SR-, Chemo (Magrinat) rad rx    GERD (gastroesophageal reflux disease)    H/O migraine    HSV-2 (herpes simplex virus 2) infection    Hypercholesteremia    Rheumatoid factor positive    OA   had rheum consult   Scoliosis    Sprain of ankle 03/06/2015   revewiwed care exercise and fu if needed     Tinnitus     Family History  Problem Relation Age of Onset   Hypertension Mother    Heart disease Mother    Stroke Mother    Arthritis Mother    Heart attack Brother    Arthritis Other        maternal side RA cousin   Kidney disease Other        maternal side   Diabetes Other        1st degree relative, paternal side   Diabetes Father    Lung cancer Father     Social History   Socioeconomic History   Marital status: Widowed    Spouse name: Not on file   Number of children: Not on file   Years of education: Not on file   Highest education  level: Not on file  Occupational History   Not on file  Tobacco Use   Smoking status: Former   Smokeless tobacco: Never   Tobacco comments:    limited in college   Vaping Use   Vaping Use: Never used  Substance and Sexual Activity   Alcohol use: Yes    Alcohol/week: 7.0 standard drinks    Types: 7 Glasses of wine per week   Drug use: No   Sexual activity: Not Currently    Birth control/protection: Post-menopausal  Other Topics Concern   Not on file  Social History Narrative   Married, household of 2. 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 Designated Party release signed 04/17/10.      Husband  prostate cancer at this time. Passed  Nov 28 2020   hh of 1    Social Determinants of Health   Financial Resource Strain: Not on file  Food Insecurity: Not on file  Transportation Needs: Not on file  Physical Activity: Not on file   Stress: Not on file  Social Connections: Not on file    Outpatient Encounter Medications as of 06/12/2021  Medication Sig   acyclovir ointment (ZOVIRAX) 5 % Apply 1 application topically 3 (three) times daily as needed.   amitriptyline (ELAVIL) 10 MG tablet TAKE 1 TABLET BY MOUTH AT BEDTIME, MAY INCREASE TO 2 TABLETS EVERY EVENING AS DIRECTED   b complex vitamins tablet Take 1 tablet by mouth daily.   calcium carbonate (OS-CAL) 600 MG TABS Take 600 mg by mouth 3 (three) times daily with meals.   famotidine (PEPCID) 20 MG tablet Take 20 mg by mouth 2 (two) times daily.   Magnesium 300 MG CAPS Take 1 capsule by mouth daily.   VITAMIN A PO Take by mouth daily.   vitamin E 180 MG (400 UNITS) capsule Take 400 Units by mouth daily.   [DISCONTINUED] acyclovir (ZOVIRAX) 200 MG capsule Take 2 capsules by mouth 3 times daily for outbreaks   [DISCONTINUED] lovastatin (MEVACOR) 20 MG tablet TAKE ONE TABLET BY MOUTH ONE TIME DAILY   acyclovir (ZOVIRAX) 200 MG capsule Take 2 capsules by mouth 3 times daily for outbreaks   lovastatin (MEVACOR) 20 MG tablet Take 1 tablet (20 mg total) by mouth daily.   No facility-administered encounter medications on file as of 06/12/2021.    EXAM:  BP 132/80 (BP Location: Left Arm, Patient Position: Sitting, Cuff Size: Normal)   Pulse 77   Temp 98 F (36.7 C) (Oral)   Ht 5' 1.5" (1.562 m)   Wt 132 lb 12.8 oz (60.2 kg)   LMP 11/24/1997 (Approximate)   SpO2 98%   BMI 24.69 kg/m   Body mass index is 24.69 kg/m.  Physical Exam: Vital signs reviewed TLX:BWIO is a well-developed well-nourished alert cooperative   who appears stated age in no acute distress.  HEENT: normocephalic atraumatic , Eyes: PERRL EOM's full, conjunctiva clear, Nares: paten,t no deformity discharge or tenderness., Ears: no deformity EAC's clear TMs with normal landmarks. Mouth: masked  NECK: supple without masses, thyromegaly or bruits. No obv mass tender below ear    CHEST/PULM:  Clear  to auscultation and percussion breath sounds equal no wheeze , rales or rhonchi. No chest wall deformities or tenderness. Breast: absent  CV: PMI is nondisplaced, S1 S2 no gallops, murmurs, rubs. Peripheral pulses are full without delay.No JVD .  ABDOMEN: Bowel sounds normal nontender  No guard or rebound, no hepato splenomegal no CVA tenderness.  Extremtities:  No clubbing cyanosis or edema, no acute joint swelling or redness no focal atrophy NEURO:  Oriented x3, cranial nerves 3-12 appear to be intact, no obvious focal weakness,gait within normal limits no abnormal reflexes or asymmetrical SKIN: No acute rashes normal turgor, color, no bruising or petechiae. PSYCH: Oriented, good eye contact, no obvious depression anxiety, cognition and judgment appear normal. LN: no cervical axillary inguinal adenopathy No noted deficits in memory, attention, and speech.   Lab Results  Component Value Date   WBC 4.5 06/11/2020   HGB 13.0 06/11/2020   HCT 39.3 06/11/2020   PLT 254 06/11/2020   GLUCOSE 103 (H) 06/11/2020   CHOL 225 (H) 06/11/2020   TRIG 138 06/11/2020   HDL 74 06/11/2020   LDLDIRECT 119.9 12/30/2012   LDLCALC 125 (H) 06/11/2020   ALT 13 06/11/2020   AST 18 06/11/2020   NA 140 06/11/2020   K 4.6 06/11/2020   CL 101 06/11/2020   CREATININE 0.89 06/11/2020   BUN 18 06/11/2020   CO2 30 06/11/2020   TSH 1.17 06/11/2020   HGBA1C 6.0 06/10/2019   Fasting overnight.   ASSESSMENT AND PLAN:  Discussed the following assessment and plan:  Visit for preventive health examination  Mixed hyperlipidemia - Plan: Basic metabolic panel, CBC with Differential/Platelet, Hemoglobin A1c, Hepatic function panel, Lipid panel, Lipid panel, Hepatic function panel, Hemoglobin A1c, CBC with Differential/Platelet, Basic metabolic panel  Medication management - Plan: Basic metabolic panel, CBC with Differential/Platelet, Hemoglobin A1c, Hepatic function panel, Lipid panel, Lipid panel, Hepatic  function panel, Hemoglobin A1c, CBC with Differential/Platelet, Basic metabolic panel  Sleep disturbance  BRCA1 positive - Plan: Basic metabolic panel, CBC with Differential/Platelet, Hemoglobin A1c, Hepatic function panel, Lipid panel, Lipid panel, Hepatic function panel, Hemoglobin A1c, CBC with Differential/Platelet, Basic metabolic panel  Colon cancer screening - Plan: Cologuard Order cologuard   no mammogram  Can dis tumor markers with Dr. Sabra Heck  Patient Care Team: Gentle Hoge, [name], MD as PCP - General Bo Merino, MD (Rheumatology) Ronald Lobo, MD (Gastroenterology) Phylliss Bob, MD as Consulting Physician (Orthopedic Surgery) Milly Jakob, MD as Consulting Physician (Orthopedic Surgery) Marygrace Drought, MD as Consulting Physician (Ophthalmology)  Patient Instructions  Will notify you  of labs when available.   Will refill meds .   Ask  Dr Sabra Heck about the tumor markers.   Health Maintenance, Female Adopting a healthy lifestyle and getting preventive care are important in promoting health and wellness. Ask your health care provider about: The right schedule for you to have regular tests and exams. Things you can do on your own to prevent diseases and keep yourself healthy. What should I know about diet, weight, and exercise? Eat a healthy diet  Eat a diet that includes plenty of vegetables, fruits, low-fat dairy products, and lean protein. Do not eat a lot of foods that are high in solid fats, added sugars, or sodium.  Maintain a healthy weight Body mass index (BMI) is used to identify weight problems. It estimates body fat based on height and weight. Your health care provider can help determineyour BMI and help you achieve or maintain a healthy weight. Get regular exercise Get regular exercise. This is one of the most important things you can do for your health. Most adults should: Exercise for at least 150 minutes each week. The exercise should increase  your heart rate and make you sweat (moderate-intensity exercise). Do strengthening exercises at least twice a week. This is in addition to the moderate-intensity exercise. Spend  less time sitting. Even light physical activity can be beneficial. Watch cholesterol and blood lipids Have your blood tested for lipids and cholesterol at 74 years of age, then havethis test every 5 years. Have your cholesterol levels checked more often if: Your lipid or cholesterol levels are high. You are older than 74 years of age. You are at high risk for heart disease. What should I know about cancer screening? Depending on your health history and family history, you may need to have cancer screening at various ages. This may include screening for: Breast cancer. Cervical cancer. Colorectal cancer. Skin cancer. Lung cancer. What should I know about heart disease, diabetes, and high blood pressure? Blood pressure and heart disease High blood pressure causes heart disease and increases the risk of stroke. This is more likely to develop in people who have high blood pressure readings, are of African descent, or are overweight. Have your blood pressure checked: Every 3-5 years if you are 18-35 years of age. Every year if you are 57 years old or older. Diabetes Have regular diabetes screenings. This checks your fasting blood sugar level. Have the screening done: Once every three years after age 62 if you are at a normal weight and have a low risk for diabetes. More often and at a younger age if you are overweight or have a high risk for diabetes. What should I know about preventing infection? Hepatitis B If you have a higher risk for hepatitis B, you should be screened for this virus. Talk with your health care provider to find out if you are at risk forhepatitis B infection. Hepatitis C Testing is recommended for: Everyone born from [birth year] through 1964. Anyone with known risk factors for hepatitis C. Sexually  transmitted infections (STIs) Get screened for STIs, including gonorrhea and chlamydia, if: You are sexually active and are younger than 73 years of age. You are older than 74 years of age and your health care provider tells you that you are at risk for this type of infection. Your sexual activity has changed since you were last screened, and you are at increased risk for chlamydia or gonorrhea. Ask your health care provider if you are at risk. Ask your health care provider about whether you are at high risk for HIV. Your health care provider may recommend a prescription medicine to help prevent HIV infection. If you choose to take medicine to prevent HIV, you should first get tested for HIV. You should then be tested every 3 months for as long as you are taking the medicine. Pregnancy If you are about to stop having your period (premenopausal) and you may become pregnant, seek counseling before you get pregnant. Take 400 to 800 micrograms (mcg) of folic acid every day if you become pregnant. Ask for birth control (contraception) if you want to prevent pregnancy. Osteoporosis and menopause Osteoporosis is a disease in which the bones lose minerals and strength with aging. This can result in bone fractures. If you are 48 years old or older, or if you are at risk for osteoporosis and fractures, ask your health care provider if you should: Be screened for bone loss. Take a calcium or vitamin D supplement to lower your risk of fractures. Be given hormone replacement therapy (HRT) to treat symptoms of menopause. Follow these instructions at home: Lifestyle Do not use any products that contain nicotine or tobacco, such as cigarettes, e-cigarettes, and chewing tobacco. If you need help quitting, ask your health care provider. Do not  use street drugs. Do not share needles. Ask your health care provider for help if you need support or information about quitting drugs. Alcohol use Do not drink alcohol  if: Your health care provider tells you not to drink. You are pregnant, may be pregnant, or are planning to become pregnant. If you drink alcohol: Limit how much you use to 0-1 drink a day. Limit intake if you are breastfeeding. Be aware of how much alcohol is in your drink. In the [place] one drink equals one 12 oz bottle of beer (355 mL), one 5 oz glass of wine (148 mL), or one 1 oz glass of hard liquor (44 mL). General instructions Schedule regular health, dental, and eye exams. Stay current with your vaccines. Tell your health care provider if: You often feel depressed. You have ever been abused or do not feel safe at home. Summary Adopting a healthy lifestyle and getting preventive care are important in promoting health and wellness. Follow your health care provider's instructions about healthy diet, exercising, and getting tested or screened for diseases. Follow your health care provider's instructions on monitoring your cholesterol and blood pressure. This information is not intended to replace advice given to you by your health care provider. Make sure you discuss any questions you have with your healthcare provider. Document Revised: 11/03/2018 Document Reviewed: 11/03/2018 Elsevier Patient Education  2022 [employer]. Heily Carlucci M.D.

## 2021-06-12 ENCOUNTER — Encounter: Payer: Self-pay | Admitting: Internal Medicine

## 2021-06-12 ENCOUNTER — Other Ambulatory Visit (HOSPITAL_COMMUNITY): Payer: Self-pay

## 2021-06-12 ENCOUNTER — Ambulatory Visit (INDEPENDENT_AMBULATORY_CARE_PROVIDER_SITE_OTHER): Payer: Medicare PPO | Admitting: Internal Medicine

## 2021-06-12 VITALS — BP 132/80 | HR 77 | Temp 98.0°F | Ht 61.5 in | Wt 132.8 lb

## 2021-06-12 DIAGNOSIS — E782 Mixed hyperlipidemia: Secondary | ICD-10-CM | POA: Diagnosis not present

## 2021-06-12 DIAGNOSIS — Z1509 Genetic susceptibility to other malignant neoplasm: Secondary | ICD-10-CM | POA: Diagnosis not present

## 2021-06-12 DIAGNOSIS — Z79899 Other long term (current) drug therapy: Secondary | ICD-10-CM | POA: Diagnosis not present

## 2021-06-12 DIAGNOSIS — G479 Sleep disorder, unspecified: Secondary | ICD-10-CM | POA: Diagnosis not present

## 2021-06-12 DIAGNOSIS — Z0000 Encounter for general adult medical examination without abnormal findings: Secondary | ICD-10-CM | POA: Diagnosis not present

## 2021-06-12 DIAGNOSIS — Z1211 Encounter for screening for malignant neoplasm of colon: Secondary | ICD-10-CM

## 2021-06-12 DIAGNOSIS — Z1501 Genetic susceptibility to malignant neoplasm of breast: Secondary | ICD-10-CM

## 2021-06-12 LAB — CBC WITH DIFFERENTIAL/PLATELET
Basophils Absolute: 0 10*3/uL (ref 0.0–0.1)
Basophils Relative: 0.7 % (ref 0.0–3.0)
Eosinophils Absolute: 0.1 10*3/uL (ref 0.0–0.7)
Eosinophils Relative: 2.1 % (ref 0.0–5.0)
HCT: 38.4 % (ref 36.0–46.0)
Hemoglobin: 13.1 g/dL (ref 12.0–15.0)
Lymphocytes Relative: 19.7 % (ref 12.0–46.0)
Lymphs Abs: 0.9 10*3/uL (ref 0.7–4.0)
MCHC: 34 g/dL (ref 30.0–36.0)
MCV: 89.5 fl (ref 78.0–100.0)
Monocytes Absolute: 0.4 10*3/uL (ref 0.1–1.0)
Monocytes Relative: 7.6 % (ref 3.0–12.0)
Neutro Abs: 3.3 10*3/uL (ref 1.4–7.7)
Neutrophils Relative %: 69.9 % (ref 43.0–77.0)
Platelets: 245 10*3/uL (ref 150.0–400.0)
RBC: 4.29 Mil/uL (ref 3.87–5.11)
RDW: 13.6 % (ref 11.5–15.5)
WBC: 4.8 10*3/uL (ref 4.0–10.5)

## 2021-06-12 LAB — HEPATIC FUNCTION PANEL
ALT: 14 U/L (ref 0–35)
AST: 20 U/L (ref 0–37)
Albumin: 4.7 g/dL (ref 3.5–5.2)
Alkaline Phosphatase: 61 U/L (ref 39–117)
Bilirubin, Direct: 0.1 mg/dL (ref 0.0–0.3)
Total Bilirubin: 0.5 mg/dL (ref 0.2–1.2)
Total Protein: 7.1 g/dL (ref 6.0–8.3)

## 2021-06-12 LAB — LIPID PANEL
Cholesterol: 199 mg/dL (ref 0–200)
HDL: 77.8 mg/dL (ref >39.00)
LDL Cholesterol: 99 mg/dL (ref 0–99)
NonHDL: 121.57
Total CHOL/HDL Ratio: 3
Triglycerides: 114 mg/dL (ref 0.0–149.0)
VLDL: 22.8 mg/dL (ref 0.0–40.0)

## 2021-06-12 LAB — BASIC METABOLIC PANEL
BUN: 15 mg/dL (ref 6–23)
CO2: 29 mEq/L (ref 19–32)
Calcium: 10.4 mg/dL (ref 8.4–10.5)
Chloride: 100 mEq/L (ref 96–112)
Creatinine, Ser: 0.86 mg/dL (ref 0.40–1.20)
GFR: 66.77 mL/min (ref >60.00)
Glucose, Bld: 109 mg/dL — ABNORMAL HIGH (ref 70–99)
Potassium: 4.4 mEq/L (ref 3.5–5.1)
Sodium: 137 mEq/L (ref 135–145)

## 2021-06-12 LAB — HEMOGLOBIN A1C: Hgb A1c MFr Bld: 6 % (ref 4.6–6.5)

## 2021-06-12 MED ORDER — ACYCLOVIR 200 MG PO CAPS
ORAL_CAPSULE | ORAL | 6 refills | Status: DC
  Filled 2021-06-12 – 2021-09-06 (×2): qty 60, 10d supply, fill #0
  Filled 2021-12-25: qty 60, 10d supply, fill #1
  Filled 2022-05-28: qty 60, 10d supply, fill #2

## 2021-06-12 MED ORDER — LOVASTATIN 20 MG PO TABS
20.0000 mg | ORAL_TABLET | Freq: Every day | ORAL | 3 refills | Status: DC
  Filled 2021-06-12 – 2021-07-09 (×2): qty 90, 90d supply, fill #0
  Filled 2021-10-08: qty 90, 90d supply, fill #1
  Filled 2022-01-21: qty 90, 90d supply, fill #2
  Filled 2022-05-04: qty 90, 90d supply, fill #3

## 2021-06-12 NOTE — Patient Instructions (Signed)
Will notify you  of labs when available.   Will refill meds .   Ask  Dr Sabra Heck about the tumor markers.   Health Maintenance, Female Adopting a healthy lifestyle and getting preventive care are important in promoting health and wellness. Ask your health care provider about: The right schedule for you to have regular tests and exams. Things you can do on your own to prevent diseases and keep yourself healthy. What should I know about diet, weight, and exercise? Eat a healthy diet  Eat a diet that includes plenty of vegetables, fruits, low-fat dairy products, and lean protein. Do not eat a lot of foods that are high in solid fats, added sugars, or sodium.  Maintain a healthy weight Body mass index (BMI) is used to identify weight problems. It estimates body fat based on height and weight. Your health care provider can help determineyour BMI and help you achieve or maintain a healthy weight. Get regular exercise Get regular exercise. This is one of the most important things you can do for your health. Most adults should: Exercise for at least 150 minutes each week. The exercise should increase your heart rate and make you sweat (moderate-intensity exercise). Do strengthening exercises at least twice a week. This is in addition to the moderate-intensity exercise. Spend less time sitting. Even light physical activity can be beneficial. Watch cholesterol and blood lipids Have your blood tested for lipids and cholesterol at 74 years of age, then havethis test every 5 years. Have your cholesterol levels checked more often if: Your lipid or cholesterol levels are high. You are older than 74 years of age. You are at high risk for heart disease. What should I know about cancer screening? Depending on your health history and family history, you may need to have cancer screening at various ages. This may include screening for: Breast cancer. Cervical cancer. Colorectal cancer. Skin  cancer. Lung cancer. What should I know about heart disease, diabetes, and high blood pressure? Blood pressure and heart disease High blood pressure causes heart disease and increases the risk of stroke. This is more likely to develop in people who have high blood pressure readings, are of African descent, or are overweight. Have your blood pressure checked: Every 3-5 years if you are 43-48 years of age. Every year if you are 61 years old or older. Diabetes Have regular diabetes screenings. This checks your fasting blood sugar level. Have the screening done: Once every three years after age 16 if you are at a normal weight and have a low risk for diabetes. More often and at a younger age if you are overweight or have a high risk for diabetes. What should I know about preventing infection? Hepatitis B If you have a higher risk for hepatitis B, you should be screened for this virus. Talk with your health care provider to find out if you are at risk forhepatitis B infection. Hepatitis C Testing is recommended for: Everyone born from [birth year] through 1964. Anyone with known risk factors for hepatitis C. Sexually transmitted infections (STIs) Get screened for STIs, including gonorrhea and chlamydia, if: You are sexually active and are younger than 74 years of age. You are older than 74 years of age and your health care provider tells you that you are at risk for this type of infection. Your sexual activity has changed since you were last screened, and you are at increased risk for chlamydia or gonorrhea. Ask your health care provider if you are at  risk. Ask your health care provider about whether you are at high risk for HIV. Your health care provider may recommend a prescription medicine to help prevent HIV infection. If you choose to take medicine to prevent HIV, you should first get tested for HIV. You should then be tested every 3 months for as long as you are taking the medicine. Pregnancy If  you are about to stop having your period (premenopausal) and you may become pregnant, seek counseling before you get pregnant. Take 400 to 800 micrograms (mcg) of folic acid every day if you become pregnant. Ask for birth control (contraception) if you want to prevent pregnancy. Osteoporosis and menopause Osteoporosis is a disease in which the bones lose minerals and strength with aging. This can result in bone fractures. If you are 39 years old or older, or if you are at risk for osteoporosis and fractures, ask your health care provider if you should: Be screened for bone loss. Take a calcium or vitamin D supplement to lower your risk of fractures. Be given hormone replacement therapy (HRT) to treat symptoms of menopause. Follow these instructions at home: Lifestyle Do not use any products that contain nicotine or tobacco, such as cigarettes, e-cigarettes, and chewing tobacco. If you need help quitting, ask your health care provider. Do not use street drugs. Do not share needles. Ask your health care provider for help if you need support or information about quitting drugs. Alcohol use Do not drink alcohol if: Your health care provider tells you not to drink. You are pregnant, may be pregnant, or are planning to become pregnant. If you drink alcohol: Limit how much you use to 0-1 drink a day. Limit intake if you are breastfeeding. Be aware of how much alcohol is in your drink. In the [place] one drink equals one 12 oz bottle of beer (355 mL), one 5 oz glass of wine (148 mL), or one 1 oz glass of hard liquor (44 mL). General instructions Schedule regular health, dental, and eye exams. Stay current with your vaccines. Tell your health care provider if: You often feel depressed. You have ever been abused or do not feel safe at home. Summary Adopting a healthy lifestyle and getting preventive care are important in promoting health and wellness. Follow your health care provider's  instructions about healthy diet, exercising, and getting tested or screened for diseases. Follow your health care provider's instructions on monitoring your cholesterol and blood pressure. This information is not intended to replace advice given to you by your health care provider. Make sure you discuss any questions you have with your healthcare provider. Document Revised: 11/03/2018 Document Reviewed: 11/03/2018 Elsevier Patient Education  2022 [employer].

## 2021-06-16 NOTE — Progress Notes (Signed)
Results  normal  range. Blood sugar  borderline stable no diabetes.

## 2021-06-17 ENCOUNTER — Encounter: Payer: Medicare PPO | Admitting: Internal Medicine

## 2021-06-20 ENCOUNTER — Telehealth: Payer: Self-pay | Admitting: Internal Medicine

## 2021-06-20 ENCOUNTER — Other Ambulatory Visit (HOSPITAL_COMMUNITY): Payer: Self-pay

## 2021-06-20 NOTE — Telephone Encounter (Signed)
Left message for patient to call back and schedule Medicare Annual Wellness Visit (AWV) either virtually or in office.   Last AWV 06/11/20  please schedule at anytime with [facility] Nurse Health Advisor 1 or 2   This should be a 45 minute visit.

## 2021-06-27 DIAGNOSIS — Z1211 Encounter for screening for malignant neoplasm of colon: Secondary | ICD-10-CM | POA: Diagnosis not present

## 2021-07-03 LAB — COLOGUARD: Cologuard: NEGATIVE

## 2021-07-05 ENCOUNTER — Emergency Department (HOSPITAL_BASED_OUTPATIENT_CLINIC_OR_DEPARTMENT_OTHER): Payer: Medicare PPO

## 2021-07-05 ENCOUNTER — Emergency Department (HOSPITAL_BASED_OUTPATIENT_CLINIC_OR_DEPARTMENT_OTHER)
Admission: EM | Admit: 2021-07-05 | Discharge: 2021-07-05 | Disposition: A | Discharge status: Home / Self Care | Payer: Medicare PPO | Attending: Emergency Medicine | Admitting: Emergency Medicine

## 2021-07-05 ENCOUNTER — Encounter (HOSPITAL_BASED_OUTPATIENT_CLINIC_OR_DEPARTMENT_OTHER): Payer: Self-pay | Admitting: Emergency Medicine

## 2021-07-05 DIAGNOSIS — R519 Headache, unspecified: Secondary | ICD-10-CM | POA: Diagnosis not present

## 2021-07-05 DIAGNOSIS — Z5321 Procedure and treatment not carried out due to patient leaving prior to being seen by health care provider: Secondary | ICD-10-CM | POA: Insufficient documentation

## 2021-07-05 NOTE — ED Triage Notes (Signed)
Woke up this morning with a "massive headache". Hx of migraines. Denies weakness, speech is clear.

## 2021-07-07 NOTE — Progress Notes (Signed)
Negative cologuard; repeat in 3 years.

## 2021-07-09 ENCOUNTER — Telehealth: Payer: Self-pay

## 2021-07-09 ENCOUNTER — Other Ambulatory Visit (HOSPITAL_COMMUNITY): Payer: Self-pay

## 2021-07-09 MED ORDER — AMOXICILLIN-POT CLAVULANATE 875-125 MG PO TABS
1.0000 | ORAL_TABLET | Freq: Two times a day (BID) | ORAL | 0 refills | Status: DC
  Filled 2021-07-09: qty 14, 7d supply, fill #0

## 2021-07-09 NOTE — Telephone Encounter (Addendum)
So we should evaluate tensional treatment at her appointment on Thursday If she is having fever with possible infection we would have to work her in sooner. Advise an updated home COVID test if not done in the last 3 to 5 days. (COVID infection can cause sinus symptoms and headaches.)

## 2021-07-09 NOTE — Telephone Encounter (Signed)
Pt informed of the message and verbalized understanding. Pt stated she would test for Covid and update [place] on the results via Mychart. Pt reported low grade fever but did not have exact temperature but stated she would try to give exact reading in her mychart message.

## 2021-07-09 NOTE — Telephone Encounter (Signed)
I spoke with the pt and she complains of an ongoing sinus infection. Pt stated that she has been experiencing symptoms for the last 2 months. Patient reported green discharge as well as dull migraines. Pt stated that she has been diagnosed with migraines and was seen by ENT regarding this. Patient also reported that she has been taking Amitriptyline 10 mg for her headaches and that has helped her. Pt requested antibiotics for her sinus infection as this is what is concerning her the most at the moment. Pt has video visit scheduled for 08/15 to discuss headaches

## 2021-07-09 NOTE — Telephone Encounter (Signed)
Thanks for the update I will send in Augmentin antibiotic to your pharmacy still has Nicole [name] outpatient t listed o begin and we can address at your follow-up visit this Thursday.

## 2021-07-11 ENCOUNTER — Telehealth (INDEPENDENT_AMBULATORY_CARE_PROVIDER_SITE_OTHER): Payer: Medicare PPO | Admitting: Internal Medicine

## 2021-07-11 ENCOUNTER — Encounter: Payer: Self-pay | Admitting: Internal Medicine

## 2021-07-11 ENCOUNTER — Encounter: Payer: Self-pay | Admitting: Neurology

## 2021-07-11 VITALS — Ht 62.0 in | Wt 130.0 lb

## 2021-07-11 DIAGNOSIS — R519 Headache, unspecified: Secondary | ICD-10-CM

## 2021-07-11 DIAGNOSIS — Z8669 Personal history of other diseases of the nervous system and sense organs: Secondary | ICD-10-CM | POA: Diagnosis not present

## 2021-07-11 DIAGNOSIS — Z79899 Other long term (current) drug therapy: Secondary | ICD-10-CM

## 2021-07-11 DIAGNOSIS — J019 Acute sinusitis, unspecified: Secondary | ICD-10-CM

## 2021-07-11 NOTE — Progress Notes (Signed)
Virtual Visit via Video Note  I connected withNAME@ on 07/11/21 at 10:00 AM EDT by a video enabled telemedicine application and verified that I am speaking with the correct person using two identifiers. Location patient: home Location provider:home office Persons participating in the virtual visit: patient, provider  [place] national recommendations  regarding COVID 19 pandemic   video visit is advised over in office visit for this patient.  Patient aware  of the limitations of evaluation and management by telemedicine and  availability of in person appointments. and agreed to proceed.   HPI: Nicole [name] presents for video visit Increasing headaches and recent sinus infection states for about 3 weeks she is having tooth pains facial sinus pressure some green drainage the previous notes.  I sent in Augmentin she is taking is already starting to improve.  She had a very severe headache 10 out of 10 plus and nurse triage I directed her to the emergency room.  She went to drop.  Was there for a number of hours felt worse so went home.  She had taken ibuprofen and Tylenol without a lot of relief.  There was no associated fever but did feel cold.  Nausea but no vomiting and no strokelike symptoms otherwise.  She has a remote history of migraines that could be severe but not frequent when she was working 2004 but not frequent. She did try taking the amitriptyline given to her at the last video visit 1 did not help that much so took an extra this week.  But has not been taking it regularly at the 20 mg for very long.  He thinks that the stress could be a trigger recent death of her husband and selling her property this past.  Could have contributed but she is hydrated no new medical changes.      ROS: See pertinent positives and negatives per HPI.  Past Medical History:  Diagnosis Date   BRCA1 positive    Breast cancer ([place])    Left- Ductal ca SR-, Chemo (Magrinat) rad rx    GERD  (gastroesophageal reflux disease)    H/O migraine    HSV-2 (herpes simplex virus 2) infection    Hypercholesteremia    Rheumatoid factor positive    OA   had rheum consult   Scoliosis    Sprain of ankle 03/06/2015   revewiwed care exercise and fu if needed     Tinnitus     Past Surgical History:  Procedure Laterality Date   BREAST LUMPECTOMY     LAPAROSCOPIC BILATERAL SALPINGO OOPHERECTOMY  2006   Dr. Joan Flores   MASTECTOMY Bilateral 2006    Family History  Problem Relation Age of Onset   Hypertension Mother    Heart disease Mother    Stroke Mother    Arthritis Mother    Heart attack Brother    Arthritis Other        maternal side RA cousin   Kidney disease Other        maternal side   Diabetes Other        1st degree relative, paternal side   Diabetes Father    Lung cancer Father     Social History   Tobacco Use   Smoking status: Former   Smokeless tobacco: Never   Tobacco comments:    limited in college   Vaping Use   Vaping Use: Never used  Substance Use Topics   Alcohol use: Yes    Alcohol/week: 7.0 standard drinks  Types: 7 Glasses of wine per week   Drug use: No      Current Outpatient Medications:    acyclovir (ZOVIRAX) 200 MG capsule, Take 2 capsules by mouth 3 times daily for outbreaks, Disp: 60 capsule, Rfl: 6   acyclovir ointment (ZOVIRAX) 5 %, Apply 1 application topically 3 (three) times daily as needed., Disp: 30 g, Rfl: 6   amitriptyline (ELAVIL) 10 MG tablet, TAKE 1 TABLET BY MOUTH AT BEDTIME, MAY INCREASE TO 2 TABLETS EVERY EVENING AS DIRECTED, Disp: 40 tablet, Rfl: 1   amoxicillin-clavulanate (AUGMENTIN) 875-125 MG tablet, Take 1 tablet by mouth every 12 hours., Disp: 14 tablet, Rfl: 0   b complex vitamins tablet, Take 1 tablet by mouth daily., Disp: , Rfl:    calcium carbonate (OS-CAL) 600 MG TABS, Take 600 mg by mouth 3 (three) times daily with meals., Disp: , Rfl:    famotidine (PEPCID) 20 MG tablet, Take 20 mg by mouth 2 (two) times  daily., Disp: , Rfl:    lovastatin (MEVACOR) 20 MG tablet, Take 1 tablet (20 mg total) by mouth daily., Disp: 90 tablet, Rfl: 3   Magnesium 300 MG CAPS, Take 1 capsule by mouth daily., Disp: , Rfl:    VITAMIN A PO, Take by mouth daily., Disp: , Rfl:    vitamin E 180 MG (400 UNITS) capsule, Take 400 Units by mouth daily., Disp: , Rfl:   EXAM: BP Readings from Last 3 Encounters:  06/12/21 132/80  02/13/21 138/84  02/07/21 132/80    VITALS per patient if applicable:  GENERAL: alert, oriented, appears well and in no acute distress  HEENT: atraumatic, conjunttiva clear, no obvious abnormalities on inspection of external nose and ears  NECK: normal movements of the head and neck  LUNGS: on inspection no signs of respiratory distress, breathing rate appears normal, no obvious gross SOB, gasping or wheezing  CV: no obvious cyanosis  MS: moves all visible extremities without noticeable abnormality  PSYCH/NEURO: pleasant and cooperative, no obvious depression or anxiety, speech and thought processing grossly intact Lab Results  Component Value Date   WBC 4.8 06/12/2021   HGB 13.1 06/12/2021   HCT 38.4 06/12/2021   PLT 245.0 06/12/2021   GLUCOSE 109 (H) 06/12/2021   CHOL 199 06/12/2021   TRIG 114.0 06/12/2021   HDL 77.80 06/12/2021   LDLDIRECT 119.9 12/30/2012   LDLCALC 99 06/12/2021   ALT 14 06/12/2021   AST 20 06/12/2021   NA 137 06/12/2021   K 4.4 06/12/2021   CL 100 06/12/2021   CREATININE 0.86 06/12/2021   BUN 15 06/12/2021   CO2 29 06/12/2021   TSH 1.17 06/11/2020   HGBA1C 6.0 06/12/2021    ASSESSMENT AND PLAN:  Discussed the following assessment and plan:    ICD-10-CM   1. Worsening headaches  R51.9 Ambulatory referral to Neurology    2. Acute sinusitis, recurrence not specified, unspecified location  J01.90     3. Medication management  Z79.899     4. History of migraine  Z86.69 Ambulatory referral to Neurology     Intermittent headaches sound migrainous  possible triggers stress other with intervening sinusitis. Newer and severe recently  She will keep headache calendar take 600 mg of ibuprofen at onset of crescendo headache. Take 20 mg of the amitriptyline every night for at least a week continue if helpful can increase to 30 mg at night if headaches are still problem. Plan neurology referral to help with new onset headaches.  Although suspect it  is a recurrence of her premenopausal migraines. Part aggravated by the stress of her husband's death and estate work. Counseled.   Expectant management and discussion of plan and treatment with opportunity to ask questions and all were answered. The patient agreed with the plan and demonstrated an understanding of the instructions.   Advised to call back or seek an in-person evaluation if worsening  or having  further concerns . Return for update  in about 2 weeks .    Shanon Ace, MD

## 2021-07-18 ENCOUNTER — Telehealth: Payer: Self-pay | Admitting: Internal Medicine

## 2021-07-18 NOTE — Telephone Encounter (Signed)
Left message for patient to call back and schedule Medicare Annual Wellness Visit (AWV) either virtually or in office.   Left  my Herbie Drape number [phone]   Last AWV 06/11/20 please schedule at anytime with [facility] Nurse Health Advisor 1 or 2   This should be a 45 minute visit.   On ones like these, if you can get patient to schedule with the [facility], then it is always better to do that, if patient does not want to , then I can check the documentation.  She sometimes does not have all documentation needed.  Let me know if you have questions. Thanks, Dawn        Previous Messages   ----- Message -----  From: Darl Householder  Sent: 06/19/2021  10:42 AM EDT  To: Philbert Riser  Subject: awv                                             Pt had cpx 06/12/21  on office note dr Zack Seal wrote  comes in today for Preventive Medicare exam/ wellness visit and medication evaluation but it looks like she didn't bill for  can this be re file or can patient have awv   thanks

## 2021-07-29 NOTE — Telephone Encounter (Signed)
Glad you are doing better  continue on the amitriptyline.

## 2021-07-30 ENCOUNTER — Other Ambulatory Visit (HOSPITAL_COMMUNITY): Payer: Self-pay

## 2021-07-30 MED FILL — Amitriptyline HCl Tab 10 MG: ORAL | 20 days supply | Qty: 40 | Fill #0 | Status: AC

## 2021-08-08 NOTE — Progress Notes (Signed)
NEUROLOGY CONSULTATION NOTE  Nicole [name] MRN: [id number] DOB: 10-Jun-1946  Referring provider: Shanon Ace, MD Primary care provider: Shanon Ace, MD  Reason for consult:  headache  Assessment/Plan:   Migraine with aura, without status migrainosus, not intractable - increased frequency due to emotional stress, sleep deprivation and change in barometric pressure and seasonal allergies.  Given improvement in headaches with treatment, and benign neurologic exam, I do not suspect secondary intracranial etiology.  Continue amitriptyline 61m at bedtime Limit use of pain relievers to no more than 2 days out of week to prevent risk of rebound or medication-overuse headache. Sleep hygiene Follow up as needed.   Subjective:  Nicole RASHIDis a 75year old right-handed female with history of migraines and breast cancer who presents for headaches.  History supplemented by referring provider's note.  She has a history of migraines that were manageable.  They became frequent after her husband passed away in J02/23/24  In addition to grieving, she was only sleeping 2-3 hours a night.  She describes severe diffuse pressure-headaches including back of neck and over the maxillae.  Associated visual aura (halos, letters that she reads turn red), photophobia, phonophobia and sometimes nausea.  They would last several hours and were occuring 2 to 3 times a week.  Migraines became so severe that she had to go to the ED in August.  Other factors contributing to her migraines include arthritis in her neck, right TMJ dysfunction and sinus problems due to seasonal allergies and change in weather.  She was placed on amitriptyline.  She is sleeping better now and headaches have reduced.  She reports only one migraine over the past 4 weeks.    Of note, she reports history of vertigo that occurs infrequently.  Last bout was a couple of months ago.  She reports tinnitus.   Current NSAIDS/analgesics:   none Current triptans:  none Current ergotamine:  none Current anti-emetic:  none Current muscle relaxants:  none Current Antihypertensive medications:  none Current Antidepressant medications:  amitriptyline 141mat bedtime Current Anticonvulsant medications:  none Current anti-CGRP:  none Current Vitamins/Herbal/Supplements:  magnesium 30010mOs-Cal, A, E Current Antihistamines/Decongestants:  none Other therapy:  none Hormone/birth control:  none   Past NSAIDS/analgesics:  none Past abortive triptans:  none Past abortive ergotamine:  none Past muscle relaxants:  none Past anti-emetic:  none Past antihypertensive medications:  none Past antidepressant medications:  none Past anticonvulsant medications:  none Past anti-CGRP:  none Past vitamins/Herbal/Supplements:  none Past antihistamines/decongestants:  none   PAST MEDICAL HISTORY: Past Medical History:  Diagnosis Date   BRCA1 positive    Breast cancer ([facility])    Left- Ductal ca SR-, Chemo (Magrinat) rad rx    GERD (gastroesophageal reflux disease)    H/O migraine    HSV-2 (herpes simplex virus 2) infection    Hypercholesteremia    Rheumatoid factor positive    OA   had rheum consult   Scoliosis    Sprain of ankle 03/06/2015   revewiwed care exercise and fu if needed     Tinnitus     PAST SURGICAL HISTORY: Past Surgical History:  Procedure Laterality Date   BREAST LUMPECTOMY     LAPAROSCOPIC BILATERAL SALPINGO OOPHERECTOMY  2006   Dr. RomJoan FloresMASTECTOMY Bilateral 2006    MEDICATIONS: Current Outpatient Medications on File Prior to Visit  Medication Sig Dispense Refill   acyclovir (ZOVIRAX) 200 MG capsule Take 2 capsules by mouth 3 times daily for  outbreaks 60 capsule 6   acyclovir ointment (ZOVIRAX) 5 % Apply 1 application topically 3 (three) times daily as needed. 30 g 6   amitriptyline (ELAVIL) 10 MG tablet TAKE 1 TABLET BY MOUTH AT BEDTIME, MAY INCREASE TO 2 TABLETS EVERY EVENING AS DIRECTED 40 tablet 1    amoxicillin-clavulanate (AUGMENTIN) 875-125 MG tablet Take 1 tablet by mouth every 12 hours. 14 tablet 0   b complex vitamins tablet Take 1 tablet by mouth daily.     calcium carbonate (OS-CAL) 600 MG TABS Take 600 mg by mouth 3 (three) times daily with meals.     famotidine (PEPCID) 20 MG tablet Take 20 mg by mouth 2 (two) times daily.     lovastatin (MEVACOR) 20 MG tablet Take 1 tablet (20 mg total) by mouth daily. 90 tablet 3   Magnesium 300 MG CAPS Take 1 capsule by mouth daily.     VITAMIN A PO Take by mouth daily.     vitamin E 180 MG (400 UNITS) capsule Take 400 Units by mouth daily.     No current facility-administered medications on file prior to visit.    ALLERGIES: Allergies  Allergen Reactions   Sulfonamide Derivatives Nausea And Vomiting   Latex Rash    FAMILY HISTORY: Family History  Problem Relation Age of Onset   Hypertension Mother    Heart disease Mother    Stroke Mother    Arthritis Mother    Heart attack Brother    Arthritis Other        maternal side RA cousin   Kidney disease Other        maternal side   Diabetes Other        1st degree relative, paternal side   Diabetes Father    Lung cancer Father     Objective:  Blood pressure (!) 147/48, pulse 95, height 5' 1"  (1.549 m), weight 130 lb 9.6 oz (59.2 kg), last menstrual period 11/24/1997, SpO2 100 %. General: No acute distress.  Patient appears well-groomed.   Head:  Normocephalic/atraumatic Eyes:  fundi examined but not visualized Neck: supple, no paraspinal tenderness, full range of motion Back: No paraspinal tenderness Heart: regular rate and rhythm Lungs: Clear to auscultation bilaterally. Vascular: No carotid bruits. Neurological Exam: Mental status: alert and oriented to person, place, and time, recent and remote memory intact, fund of knowledge intact, attention and concentration intact, speech fluent and not dysarthric, language intact. Cranial nerves: CN I: not tested CN II: pupils  equal, round and reactive to light, visual fields intact CN III, IV, VI:  full range of motion, no nystagmus, no ptosis CN V: facial sensation intact. CN VII: upper and lower face symmetric CN VIII: hearing intact CN IX, X: gag intact, uvula midline CN XI: sternocleidomastoid and trapezius muscles intact CN XII: tongue midline Bulk & Tone: normal, no fasciculations. Motor:  muscle strength 5/5 throughout Sensation:  Pinprick, temperature and vibratory sensation intact. Deep Tendon Reflexes:  2+ throughout,  toes downgoing.   Finger to nose testing:  Without dysmetria.   Heel to shin:  Without dysmetria.   Gait:  Normal station and stride.  Romberg negative.    Thank you for allowing me to take part in the care of this patient.  Metta Clines, DO  CC: Shanon Ace, MD

## 2021-08-12 ENCOUNTER — Other Ambulatory Visit: Payer: Self-pay

## 2021-08-12 ENCOUNTER — Ambulatory Visit: Payer: Medicare PPO | Admitting: Neurology

## 2021-08-12 ENCOUNTER — Encounter: Payer: Self-pay | Admitting: Neurology

## 2021-08-12 VITALS — BP 147/48 | HR 95 | Ht 61.0 in | Wt 130.6 lb

## 2021-08-12 DIAGNOSIS — G43009 Migraine without aura, not intractable, without status migrainosus: Secondary | ICD-10-CM | POA: Diagnosis not present

## 2021-08-12 NOTE — Patient Instructions (Signed)
Follow up as needed

## 2021-08-18 ENCOUNTER — Telehealth: Payer: Self-pay

## 2021-08-18 NOTE — Telephone Encounter (Signed)
Call already made to schedule patient for AWV. Sw,cma

## 2021-08-19 ENCOUNTER — Other Ambulatory Visit (HOSPITAL_COMMUNITY): Payer: Self-pay

## 2021-08-19 ENCOUNTER — Other Ambulatory Visit: Payer: Self-pay | Admitting: Internal Medicine

## 2021-08-19 MED ORDER — AMITRIPTYLINE HCL 10 MG PO TABS
ORAL_TABLET | ORAL | 3 refills | Status: DC
  Filled 2021-08-19: qty 40, 20d supply, fill #0
  Filled 2021-09-05: qty 40, 20d supply, fill #1
  Filled 2021-10-08: qty 40, 20d supply, fill #2
  Filled 2021-11-22: qty 40, 20d supply, fill #3

## 2021-09-06 ENCOUNTER — Other Ambulatory Visit: Payer: Self-pay | Admitting: Internal Medicine

## 2021-09-06 ENCOUNTER — Other Ambulatory Visit (HOSPITAL_COMMUNITY): Payer: Self-pay

## 2021-09-10 DIAGNOSIS — E785 Hyperlipidemia, unspecified: Secondary | ICD-10-CM | POA: Diagnosis not present

## 2021-09-10 DIAGNOSIS — Z853 Personal history of malignant neoplasm of breast: Secondary | ICD-10-CM | POA: Diagnosis not present

## 2021-09-10 DIAGNOSIS — B009 Herpesviral infection, unspecified: Secondary | ICD-10-CM | POA: Diagnosis not present

## 2021-09-10 DIAGNOSIS — M8580 Other specified disorders of bone density and structure, unspecified site: Secondary | ICD-10-CM | POA: Diagnosis not present

## 2021-09-10 DIAGNOSIS — Z87891 Personal history of nicotine dependence: Secondary | ICD-10-CM | POA: Diagnosis not present

## 2021-09-10 DIAGNOSIS — R030 Elevated blood-pressure reading, without diagnosis of hypertension: Secondary | ICD-10-CM | POA: Diagnosis not present

## 2021-09-10 DIAGNOSIS — K219 Gastro-esophageal reflux disease without esophagitis: Secondary | ICD-10-CM | POA: Diagnosis not present

## 2021-09-10 DIAGNOSIS — Z882 Allergy status to sulfonamides status: Secondary | ICD-10-CM | POA: Diagnosis not present

## 2021-09-10 DIAGNOSIS — F419 Anxiety disorder, unspecified: Secondary | ICD-10-CM | POA: Diagnosis not present

## 2021-10-08 ENCOUNTER — Other Ambulatory Visit (HOSPITAL_COMMUNITY): Payer: Self-pay

## 2021-11-06 ENCOUNTER — Telehealth: Payer: Self-pay | Admitting: Internal Medicine

## 2021-11-06 NOTE — Telephone Encounter (Signed)
Left message for patient to call back and schedule Medicare Annual Wellness Visit (AWV) either virtually or in office. I left my number for patient to call [phone].  Last AWV ;06/11/20 please schedule at anytime with health coach  This should be a 45 minute visit.

## 2021-11-06 NOTE — Telephone Encounter (Signed)
Patient returned my call  she stated insurance came to her home to do awv

## 2021-11-22 ENCOUNTER — Other Ambulatory Visit (HOSPITAL_COMMUNITY): Payer: Self-pay

## 2021-11-26 DIAGNOSIS — D2262 Melanocytic nevi of left upper limb, including shoulder: Secondary | ICD-10-CM | POA: Diagnosis not present

## 2021-11-26 DIAGNOSIS — L814 Other melanin hyperpigmentation: Secondary | ICD-10-CM | POA: Diagnosis not present

## 2021-11-26 DIAGNOSIS — D1801 Hemangioma of skin and subcutaneous tissue: Secondary | ICD-10-CM | POA: Diagnosis not present

## 2021-11-26 DIAGNOSIS — D2261 Melanocytic nevi of right upper limb, including shoulder: Secondary | ICD-10-CM | POA: Diagnosis not present

## 2021-11-26 DIAGNOSIS — L821 Other seborrheic keratosis: Secondary | ICD-10-CM | POA: Diagnosis not present

## 2021-12-09 ENCOUNTER — Other Ambulatory Visit (HOSPITAL_COMMUNITY): Payer: Self-pay

## 2021-12-16 ENCOUNTER — Other Ambulatory Visit (HOSPITAL_COMMUNITY): Payer: Self-pay

## 2021-12-16 ENCOUNTER — Other Ambulatory Visit: Payer: Self-pay | Admitting: Internal Medicine

## 2021-12-18 NOTE — Telephone Encounter (Signed)
Ask patient   is she  taking 10 mg at night  ? If so please change rx  and send 90 refill x 1

## 2021-12-18 NOTE — Telephone Encounter (Signed)
Okay to take 15 mg at night we will have to split the 10 mg in half Please send in 1.5 pills or 15 mg of amitriptyline at bedtime dispense enough for 90 days refill x1

## 2021-12-19 ENCOUNTER — Other Ambulatory Visit: Payer: Self-pay

## 2021-12-19 ENCOUNTER — Other Ambulatory Visit (HOSPITAL_COMMUNITY): Payer: Self-pay

## 2021-12-19 DIAGNOSIS — G479 Sleep disorder, unspecified: Secondary | ICD-10-CM

## 2021-12-19 MED ORDER — AMITRIPTYLINE HCL 10 MG PO TABS
15.0000 mg | ORAL_TABLET | Freq: Every day | ORAL | 1 refills | Status: DC
Start: 2021-12-19 — End: 2022-06-07
  Filled 2021-12-19: qty 135, 90d supply, fill #0
  Filled 2022-03-12: qty 8, 6d supply, fill #1
  Filled 2022-03-12: qty 127, 84d supply, fill #1

## 2021-12-19 NOTE — Telephone Encounter (Signed)
Rx amitriptyline sent to the pharmacy

## 2021-12-20 ENCOUNTER — Other Ambulatory Visit (HOSPITAL_COMMUNITY): Payer: Self-pay

## 2021-12-24 ENCOUNTER — Encounter: Payer: Self-pay | Admitting: Family Medicine

## 2021-12-25 ENCOUNTER — Other Ambulatory Visit (HOSPITAL_COMMUNITY): Payer: Self-pay

## 2021-12-31 ENCOUNTER — Other Ambulatory Visit (HOSPITAL_COMMUNITY): Payer: Self-pay

## 2022-01-01 NOTE — Progress Notes (Signed)
Nicole [name] [address] [place] Phone: [phone] Subjective:   Nicole [name], am serving as a scribe for Dr. Hulan Saas. This visit occurred during the SARS-CoV-2 public health emergency.  Safety protocols were in place, including screening questions prior to the visit, additional usage of staff PPE, and extensive cleaning of exam room while observing appropriate contact time as indicated for disinfecting solutions.   I'm seeing this patient by the request  of:  [name], [name], MD  CC: Hip pain  GYF:[id number]  02/07/2021 Low back pain.  Patient does have known severe scoliosis.  We will get x-rays to further evaluate.  Patient will be sent to formal physical therapy which I think will be significantly beneficial.  Patient has not had a lot of time to take care of herself with her husband passing away in January.  Will not make any significant medication change follow-up with me again 6 to 8 weeks  History of scoliosis.  Patient is doing relatively well overall but will start with formal physical therapy.  Worsening pain will consider the possibility of advanced imaging.  X-rays are pending but I do believe patient will do well with conservative therapy.  Updated 01/02/2022 Nicole [name] is a 76 y.o. female coming in with complaint of hip pain. Was stretching with band went into adduction and felt pain. Location of pain is lateral and goes into groin. Sometimes feel it down thigh. Took prednisone , but once she stopped pain came back. This all happened 4 weeks ago.       Past Medical History:  Diagnosis Date   BRCA1 positive    Breast cancer ([place])    Left- Ductal ca SR-, Chemo ([facility]) rad rx    GERD (gastroesophageal reflux disease)    H/O migraine    HSV-2 (herpes simplex virus 2) infection    Hypercholesteremia    Rheumatoid factor positive    OA   had rheum consult   Scoliosis    Sprain of ankle 03/06/2015    revewiwed care exercise and fu if needed     Tinnitus    Past Surgical History:  Procedure Laterality Date   BREAST LUMPECTOMY     LAPAROSCOPIC BILATERAL SALPINGO OOPHERECTOMY  2006   Dr. Joan Flores   MASTECTOMY Bilateral 2006   Social History   Socioeconomic History   Marital status: Widowed    Spouse name: Not on file   Number of children: Not on file   Years of education: Not on file   Highest education level: Not on file  Occupational History   Not on file  Tobacco Use   Smoking status: Former   Smokeless tobacco: Never   Tobacco comments:    limited in college   Vaping Use   Vaping Use: Never used  Substance and Sexual Activity   Alcohol use: Yes    Alcohol/week: 7.0 standard drinks    Types: 7 Glasses of wine per week   Drug use: No   Sexual activity: Not Currently    Birth control/protection: Post-menopausal  Other Topics Concern   Not on file  Social History Narrative   Married, household of 2. 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 Designated Party release signed 04/17/10.      Husband  prostate cancer at this time. Passed  Nov 28 2020   hh of 1    Social Determinants of Health   Financial Resource Strain: Not on file  Food Insecurity: Not on file  Transportation Needs: Not on file  Physical Activity: Not on file  Stress: Not on file  Social Connections: Not on file   Allergies  Allergen Reactions   Sulfonamide Derivatives Nausea And Vomiting   Latex Rash   Family History  Problem Relation Age of Onset   Hypertension Mother    Heart disease Mother    Stroke Mother    Arthritis Mother    Heart attack Brother    Arthritis Other        maternal side RA cousin   Kidney disease Other        maternal side   Diabetes Other        1st degree relative, paternal side   Diabetes Father    Lung cancer Father      Current Outpatient Medications (Cardiovascular):    lovastatin (MEVACOR) 20 MG tablet, Take 1 tablet (20 mg total) by  mouth daily.     Current Outpatient Medications (Other):    acyclovir (ZOVIRAX) 200 MG capsule, Take 2 capsules by mouth 3 times daily for outbreaks   acyclovir ointment (ZOVIRAX) 5 %, Apply 1 application topically 3 (three) times daily as needed.   amitriptyline (ELAVIL) 10 MG tablet, Take 1 and 1/2 tablets (15 mg total) by mouth at bedtime.   amoxicillin-clavulanate (AUGMENTIN) 875-125 MG tablet, Take 1 tablet by mouth every 12 hours. (Patient not taking: Reported on 08/12/2021)   b complex vitamins tablet, Take 1 tablet by mouth daily.   calcium carbonate (OS-CAL) 600 MG TABS, Take 600 mg by mouth 3 (three) times daily with meals.   famotidine (PEPCID) 20 MG tablet, Take 20 mg by mouth 2 (two) times daily.   Magnesium 300 MG CAPS, Take 1 capsule by mouth daily.   VITAMIN A PO, Take by mouth daily.   vitamin E 180 MG (400 UNITS) capsule, Take 400 Units by mouth daily.   Reviewed prior external information including notes and imaging from  primary care provider As well as notes that were available from care everywhere and other healthcare systems.  Past medical history, social, surgical and family history all reviewed in electronic medical record.  No pertanent information unless stated regarding to the chief complaint.   Review of Systems:  No headache, visual changes, nausea, vomiting, diarrhea, constipation, dizziness, abdominal pain, skin rash, fevers, chills, night sweats, weight loss, swollen lymph nodes, body aches, joint swelling, chest pain, shortness of breath, mood changes. POSITIVE muscle aches  Objective  Blood pressure 118/86, pulse 69, height $RemoveBe'5\' 1"'[id number]$  (1.549 m), weight 129 lb (58.5 kg), last menstrual period 11/24/1997, SpO2 97 %.   General: No apparent distress alert and oriented x3 mood and affect normal, dressed appropriately.  HEENT: Pupils equal, extraocular movements intact  Respiratory: Patient's speak in full sentences and does not appear short of breath   Cardiovascular: No lower extremity edema, non tender, no erythema  Gait significantly antalgic.  Patient does have arthritic changes of multiple joints. Right hip is severe tenderness over the greater trochanteric area.  And some decreased range of motion especially with internal range of motion.  Patient back exam has significant degenerative scoliosis noted as well.  After verbal consent patient was prepped in sterile fashion with alcohol swabs. Ethyl chloride used patient was injected with a 22-gauge 3 inch needle into the RIGHT lateral hip in the greater trochanteric area Picture was taken. Patient had 4 cc of 0.5% Marcaine and 1 cc of Kenalog 40 mg/dL injected.  Patient tolerated the procedure well and no blood loss. Pain completely resolved after injection stating proper placement. Post injection instructions given.   [id number]; 15 additional minutes spent for Therapeutic exercises as stated in above notes.  This included exercises focusing on stretching, strengthening, with significant focus on eccentric aspects.   Long term goals include an improvement in range of motion, strength, endurance as well as avoiding reinjury. Patient's frequency would include in 1-2 times a day, 3-5 times a week for a duration of 6-12 weeks.  Hip strengthening exercises which included:  Pelvic tilt/bracing to help with proper recruitment of the lower abs and pelvic floor muscles  Glute strengthening to properly contract glutes without over-engaging low back and hamstrings - prone hip extension and glute bridge exercises Proper stretching techniques to increase effectiveness for the hip flexors, groin, quads, piriformic and low back when appropriate  Proper technique shown and discussed handout in great detail with ATC.  All questions were discussed and answered.     Impression and Recommendations:     The above documentation has been reviewed and is accurate and complete Lyndal Pulley, DO

## 2022-01-02 ENCOUNTER — Ambulatory Visit: Payer: Medicare PPO | Admitting: Family Medicine

## 2022-01-02 ENCOUNTER — Other Ambulatory Visit: Payer: Self-pay

## 2022-01-02 ENCOUNTER — Encounter: Payer: Self-pay | Admitting: Family Medicine

## 2022-01-02 ENCOUNTER — Ambulatory Visit (INDEPENDENT_AMBULATORY_CARE_PROVIDER_SITE_OTHER): Payer: Medicare PPO

## 2022-01-02 VITALS — BP 118/86 | HR 69 | Ht 61.0 in | Wt 129.0 lb

## 2022-01-02 DIAGNOSIS — M25551 Pain in right hip: Secondary | ICD-10-CM | POA: Diagnosis not present

## 2022-01-02 DIAGNOSIS — M2578 Osteophyte, vertebrae: Secondary | ICD-10-CM | POA: Diagnosis not present

## 2022-01-02 DIAGNOSIS — M7061 Trochanteric bursitis, right hip: Secondary | ICD-10-CM | POA: Diagnosis not present

## 2022-01-02 IMAGING — DX DG LUMBAR SPINE 2-3V
3 series · 3 of 3 positions shown · non-contrast
Comparison: [DATE]

CLINICAL DATA: Lower back pain.

EXAM:
LUMBAR SPINE - 2-3 VIEW

[l-spine ap]
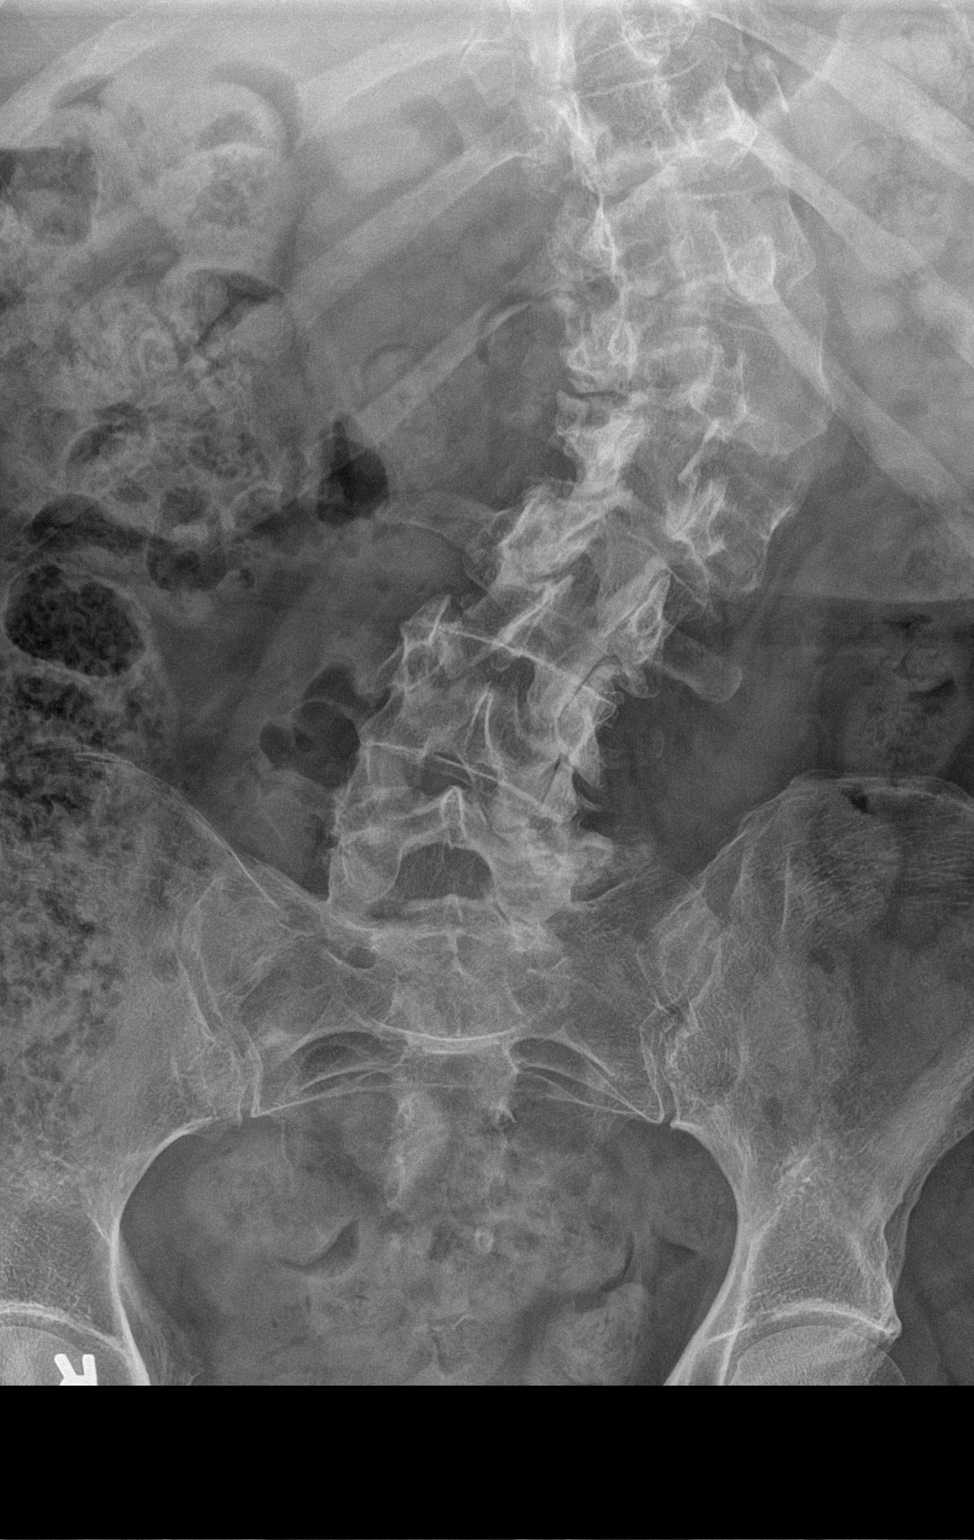

[l-spine lateral]
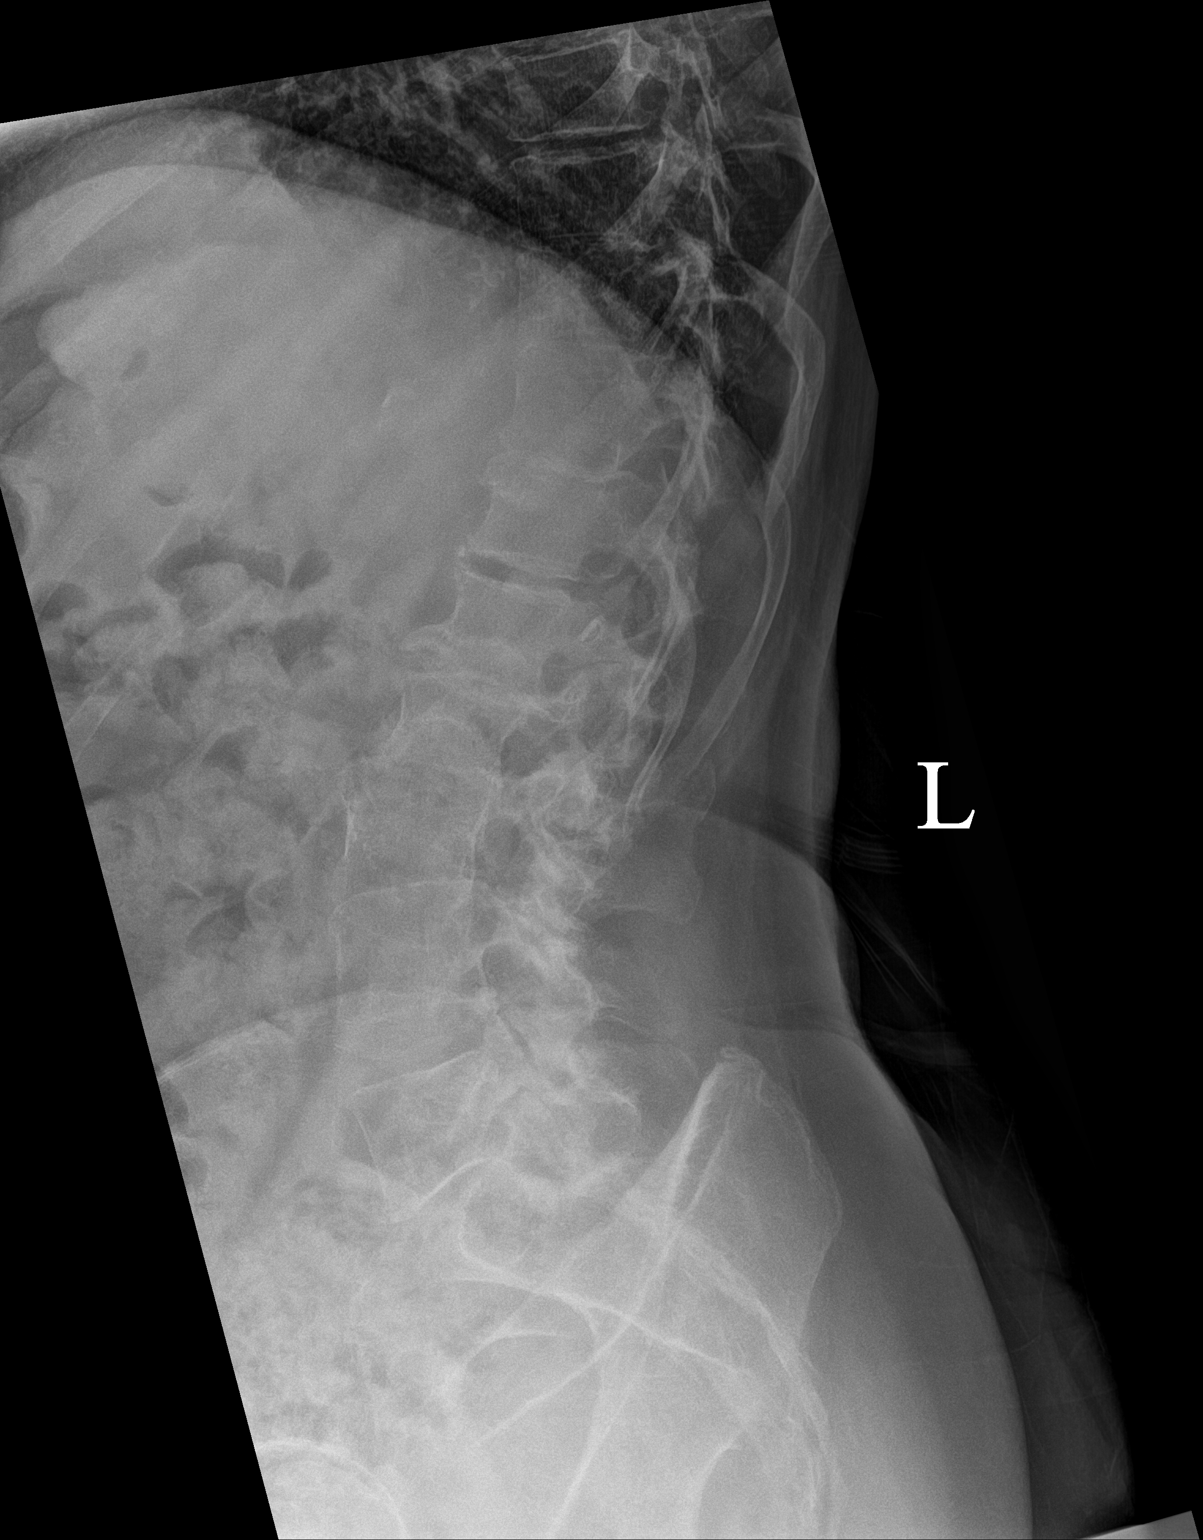

[l-spine spot]
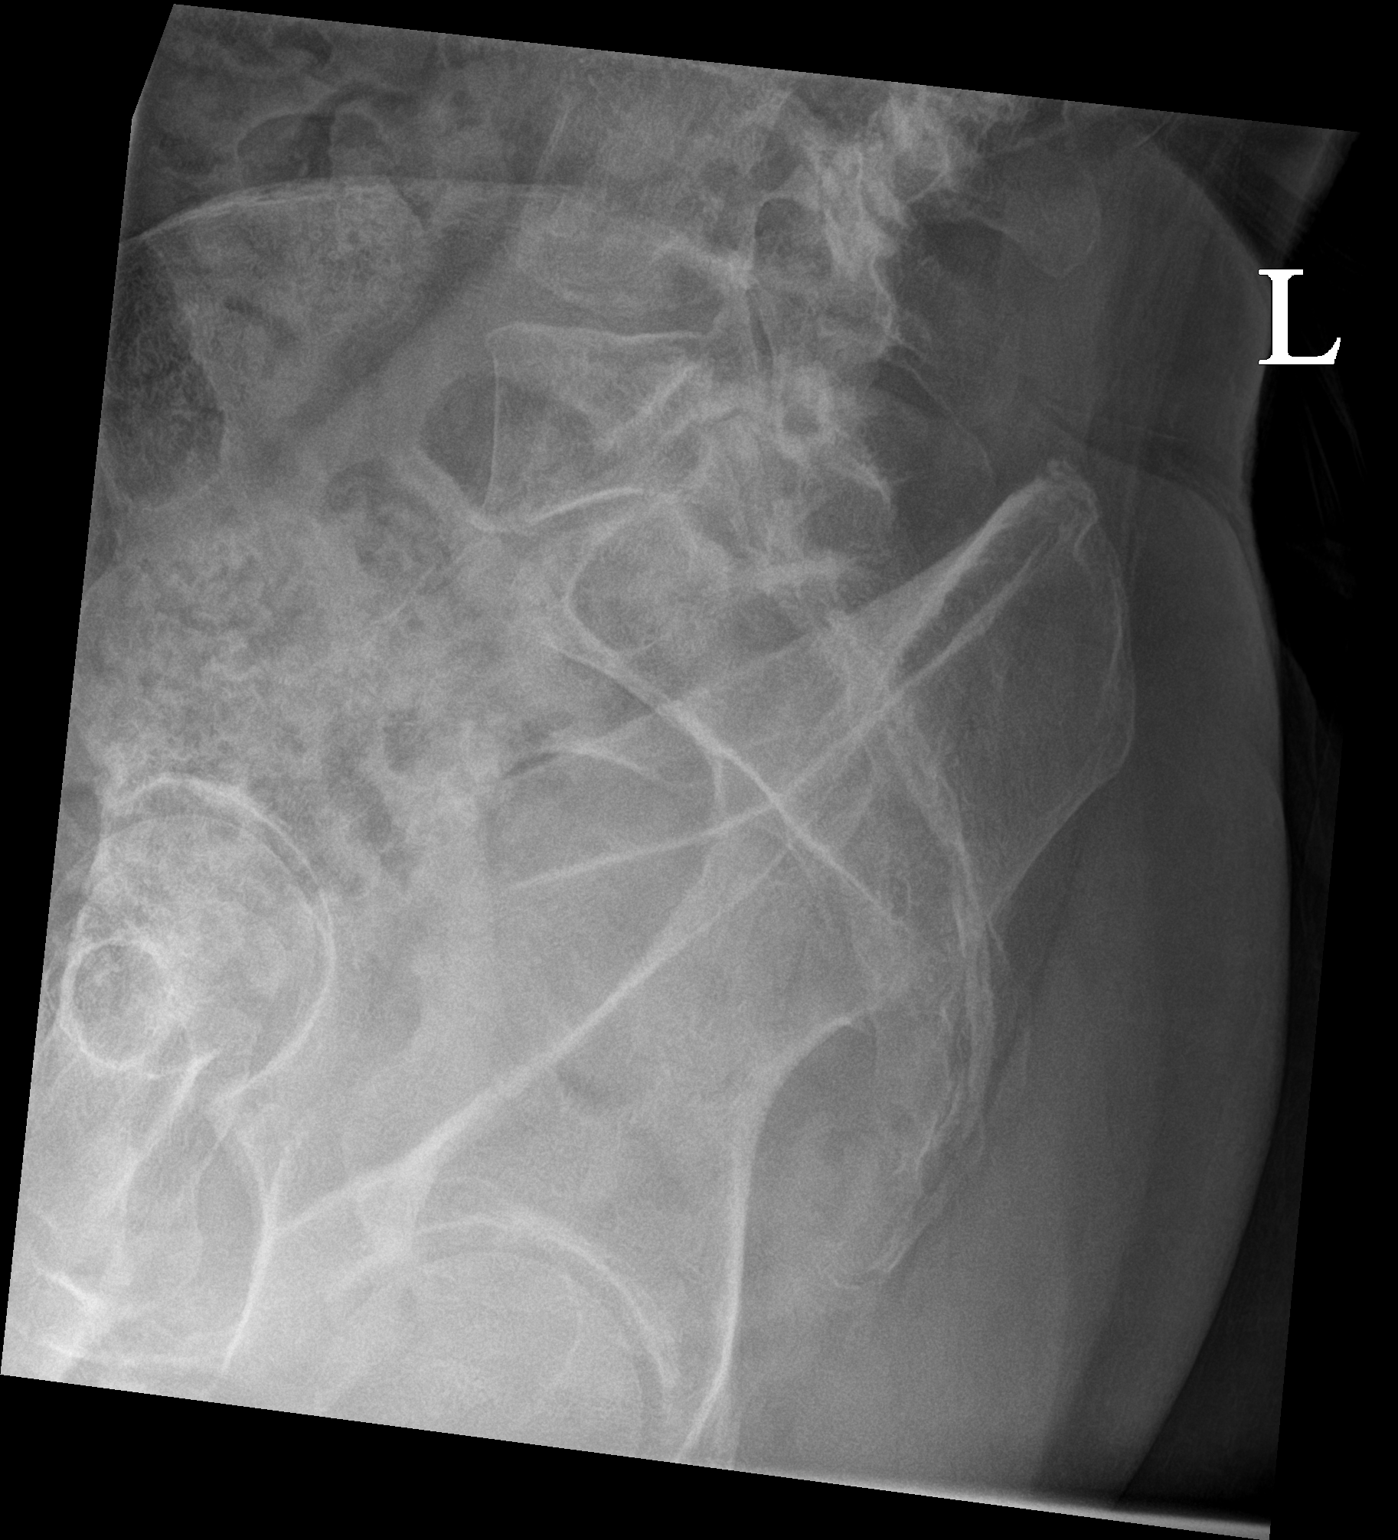

[3 of 3 positions shown; findings below may reference images not displayed]

FINDINGS: There is moderate to high-grade levocurvature centered at T12-L1
with severe right T12-L1 disc space narrowing unchanged. There are 5
non-rib-bearing lumbar-type vertebral bodies. Minimal anterior L1
vertebral body height loss is unchanged from prior. Moderate to
large anterior L1-2 and L2-3 endplate osteophytes. No sagittal
spondylolisthesis.

Moderate multilevel disc space narrowing.
IMPRESSION: Moderate to high-grade levocurvature centered at T12-L1, unchanged.

No significant change in moderate multilevel disc space narrowing.

## 2022-01-02 IMAGING — DX DG HIP (WITH OR WITHOUT PELVIS) 2-3V*R*
3 series · 3 of 3 positions shown · non-contrast
Comparison: AP pelvis [DATE]

CLINICAL DATA: Right hip and lower back pain

EXAM:
DG HIP (WITH OR WITHOUT PELVIS) 2-3V RIGHT

[pelvis ap]
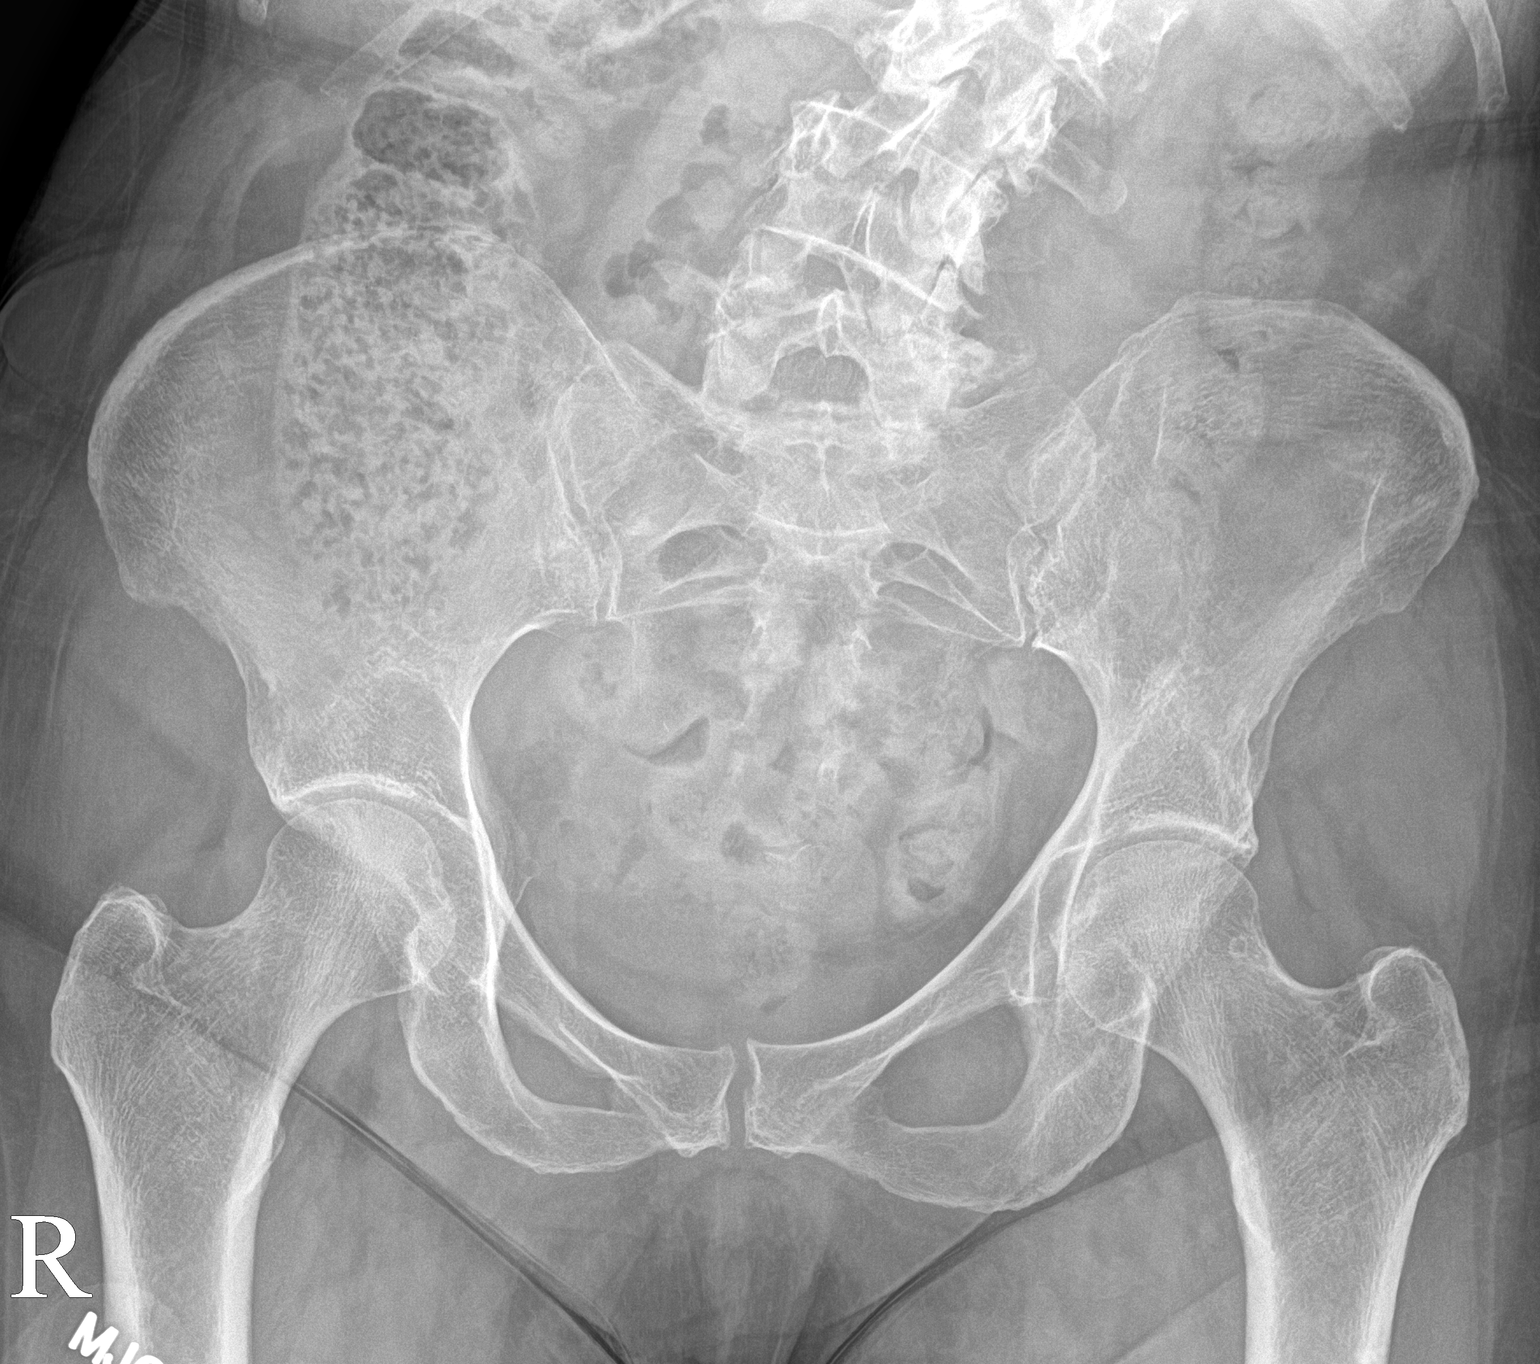

[hip ap]
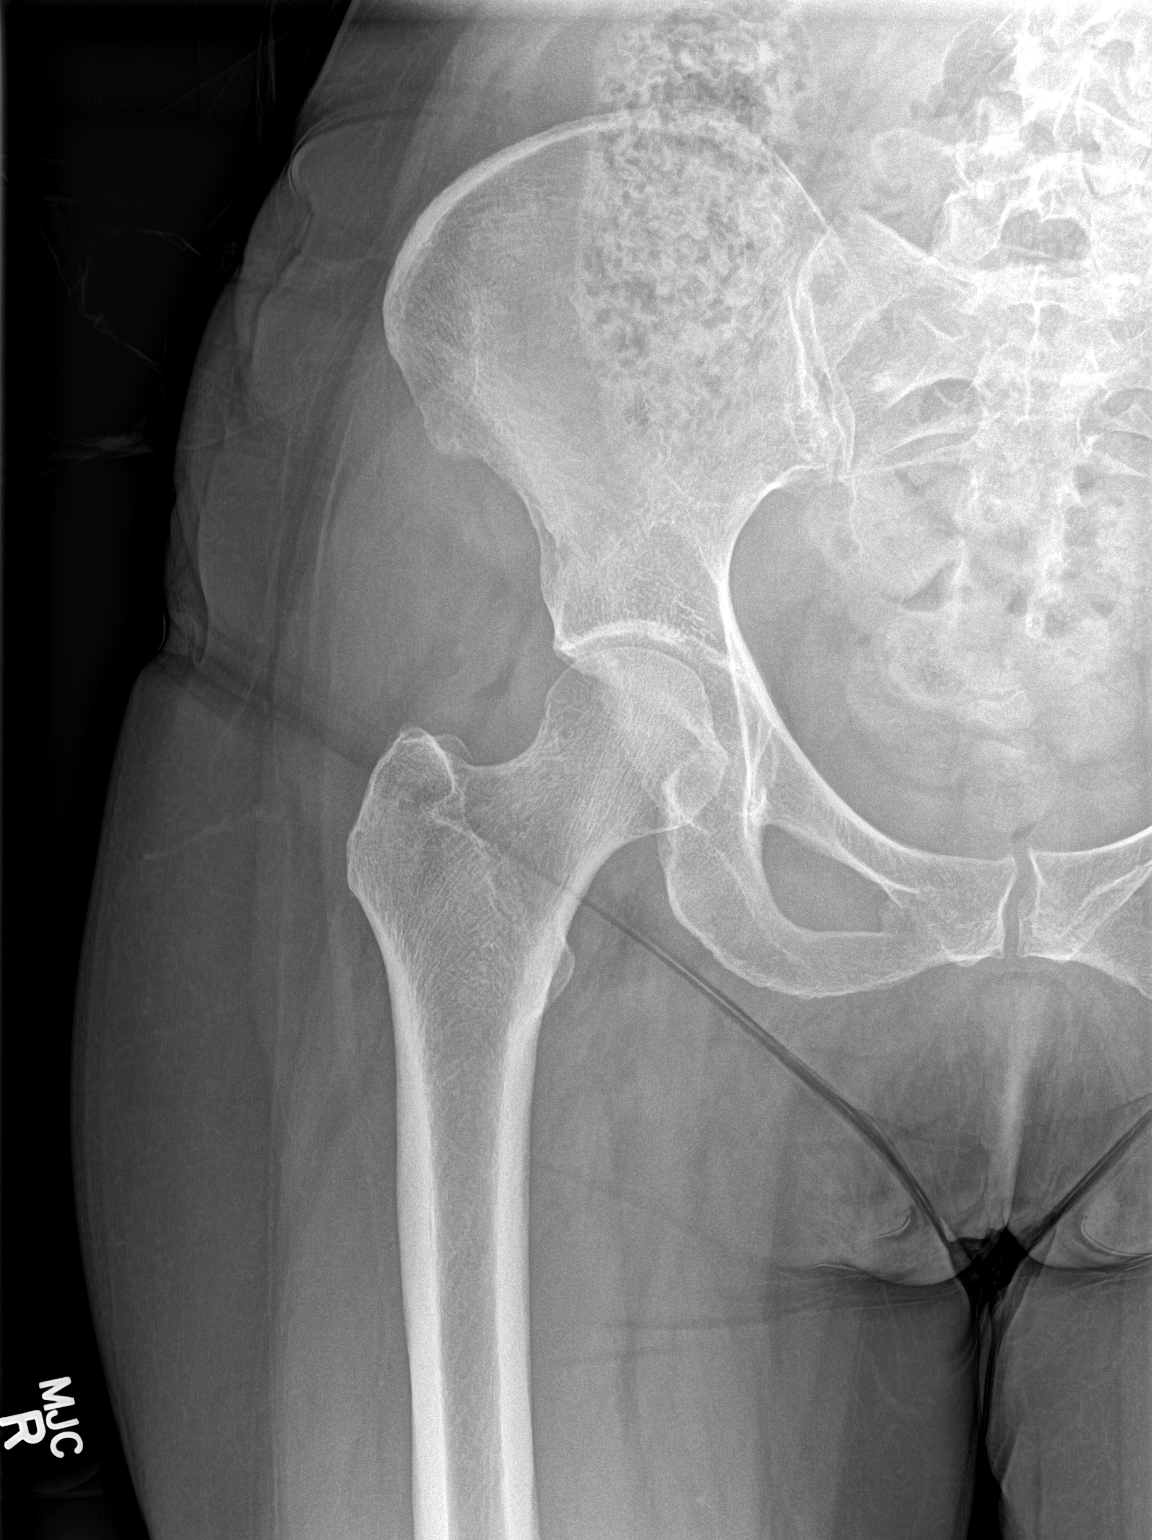

[hip frog leg]
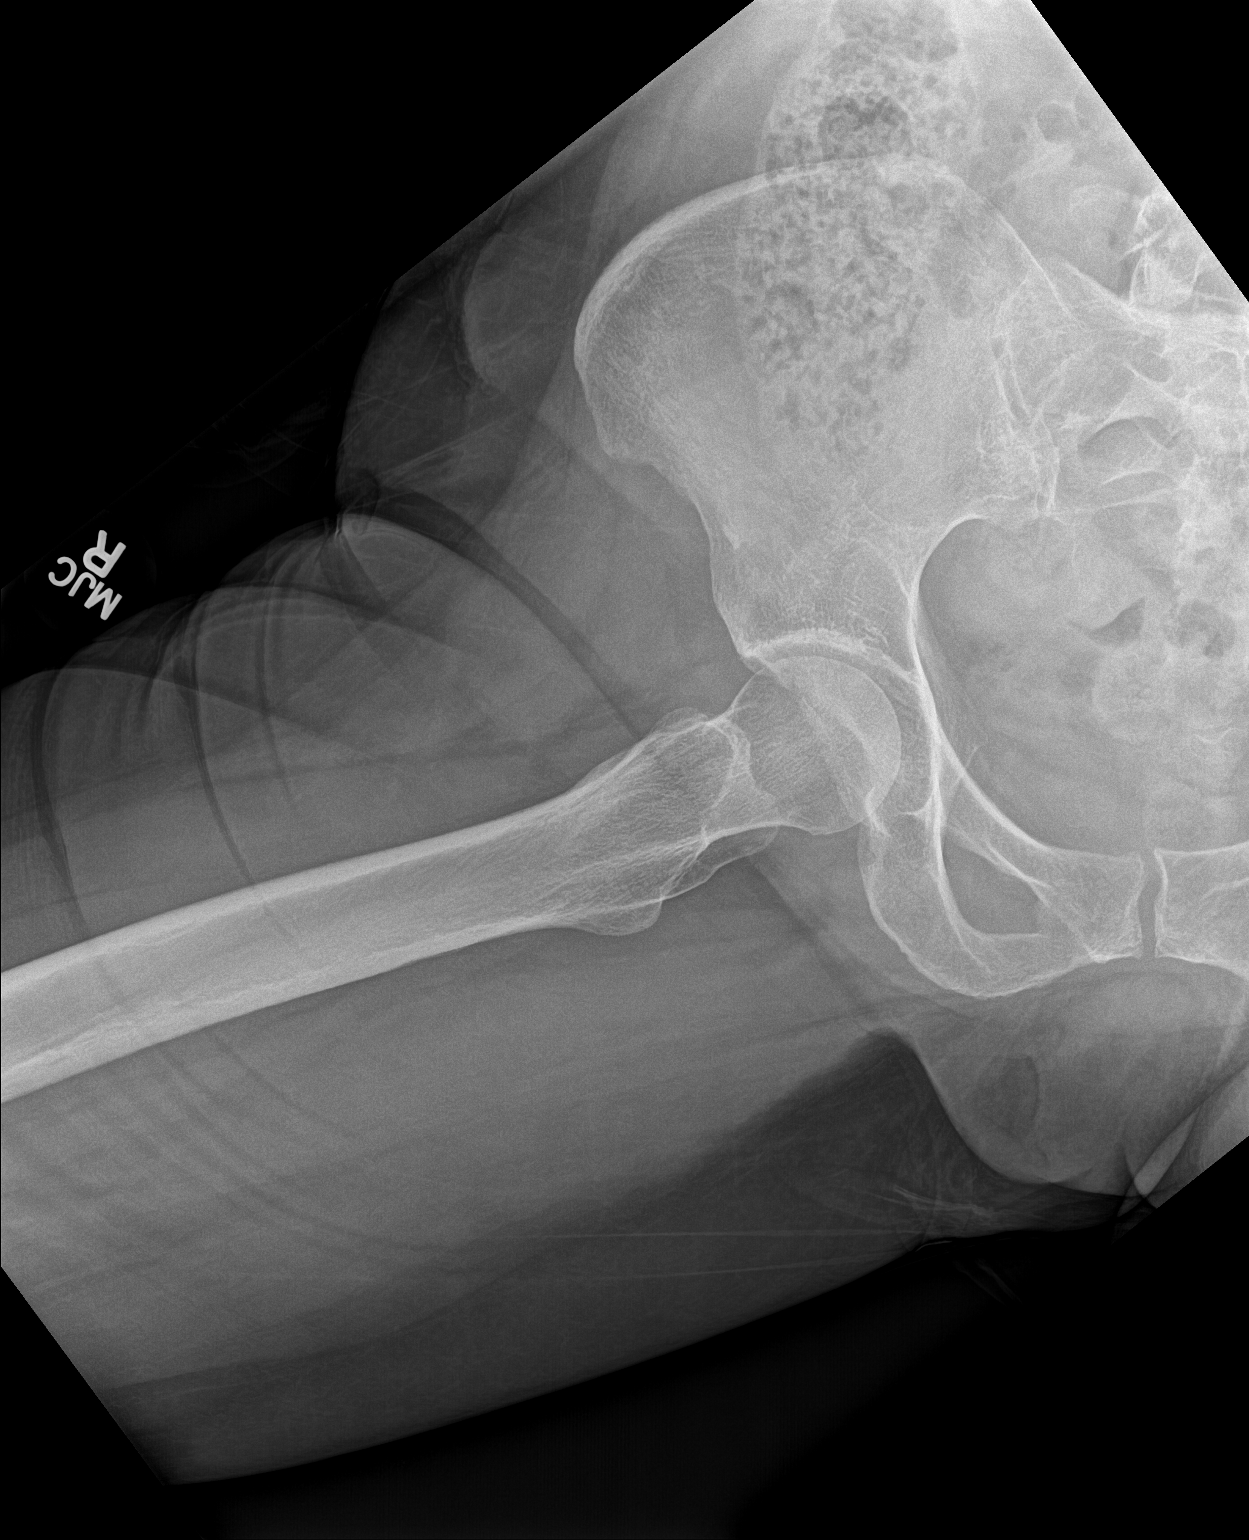

[3 of 3 positions shown; findings below may reference images not displayed]

FINDINGS: Unchanged mild pubic symphysis degenerative spurring. The bilateral
femoroacetabular and bilateral sacroiliac joint spaces are
maintained. There is levocurvature of the partially visualized
lumbar spine.

No acute fracture or dislocation.
IMPRESSION: No significant degenerative change in either hip.

## 2022-01-02 NOTE — Assessment & Plan Note (Signed)
Patient given injection today and tolerated the procedure well, discussed icing regimen and home exercises.  Discussed which activities to do and which ones to avoid.  Increase activity slowly.  Differential does include some underlying arthritis of the hip and x-rays are pending.  Patient also has some severe degenerative scoliosis lumbar spine so lumbar radiculopathy could be within the differential as well if patient is not making improvement.  Patient will follow-up with me again in 6 weeks

## 2022-01-02 NOTE — Patient Instructions (Signed)
Injection today Do prescribed exercises at least 3x a week If not better in a week or 2 write [place] and we will do an MRI See you again in 4 weeks

## 2022-01-06 ENCOUNTER — Other Ambulatory Visit: Payer: Self-pay

## 2022-01-06 ENCOUNTER — Telehealth: Payer: Self-pay | Admitting: Family Medicine

## 2022-01-06 DIAGNOSIS — M545 Low back pain, unspecified: Secondary | ICD-10-CM

## 2022-01-06 DIAGNOSIS — M25551 Pain in right hip: Secondary | ICD-10-CM

## 2022-01-06 NOTE — Telephone Encounter (Signed)
Patient called stating that since she was seen, she has had some improvement but is still having radiating pain in her hip and butt area. She said that Dr Tamala Julian mentioned possibly doing an MRI?  Please advise.

## 2022-01-06 NOTE — Telephone Encounter (Signed)
Spoke with patient who will call [facility].

## 2022-01-20 ENCOUNTER — Ambulatory Visit
Admission: RE | Admit: 2022-01-20 | Discharge: 2022-01-20 | Disposition: A | Discharge status: Home / Self Care | Payer: Medicare PPO | Source: Ambulatory Visit | Attending: Family Medicine | Admitting: Family Medicine

## 2022-01-20 ENCOUNTER — Other Ambulatory Visit: Payer: Self-pay

## 2022-01-20 DIAGNOSIS — M545 Low back pain, unspecified: Secondary | ICD-10-CM | POA: Diagnosis not present

## 2022-01-20 DIAGNOSIS — M25551 Pain in right hip: Secondary | ICD-10-CM

## 2022-01-20 DIAGNOSIS — M5127 Other intervertebral disc displacement, lumbosacral region: Secondary | ICD-10-CM | POA: Diagnosis not present

## 2022-01-20 DIAGNOSIS — S76311A Strain of muscle, fascia and tendon of the posterior muscle group at thigh level, right thigh, initial encounter: Secondary | ICD-10-CM | POA: Diagnosis not present

## 2022-01-20 DIAGNOSIS — M419 Scoliosis, unspecified: Secondary | ICD-10-CM | POA: Diagnosis not present

## 2022-01-20 DIAGNOSIS — M48061 Spinal stenosis, lumbar region without neurogenic claudication: Secondary | ICD-10-CM | POA: Diagnosis not present

## 2022-01-20 IMAGING — MR MR LUMBAR SPINE W/O CM
4 of 5 series · 25 of 48 positions shown · non-contrast
Comparison: [DATE] report, images not available. Lumbar
radiography [DATE].

CLINICAL DATA: Lumbar spine pain.  Spinal stenosis.

EXAM:
MRI LUMBAR SPINE WITHOUT CONTRAST
TECHNIQUE: Multiplanar, multisequence MR imaging of the lumbar spine was
performed. No intravenous contrast was administered.

[Series 2: T2 · sagittal · 4.0mm · 0.57mm/px · 7 of 15 slices shown (1 of 2)]
[im 1/15]
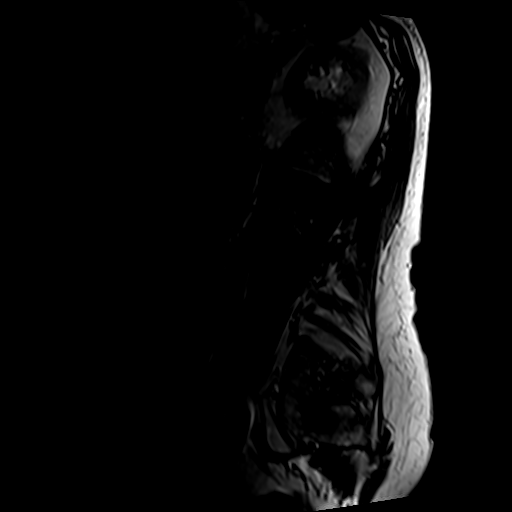
[im 3/15]
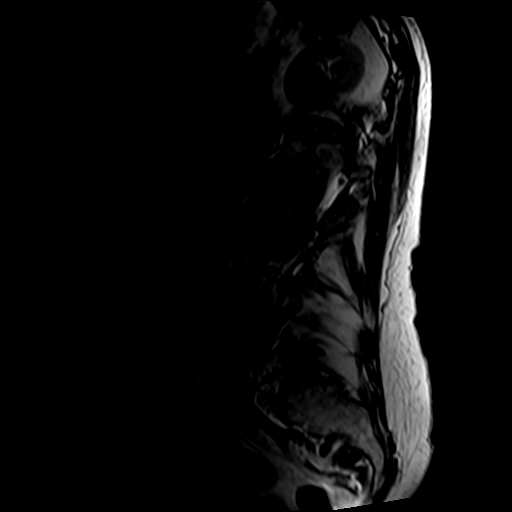
[im 5/15]
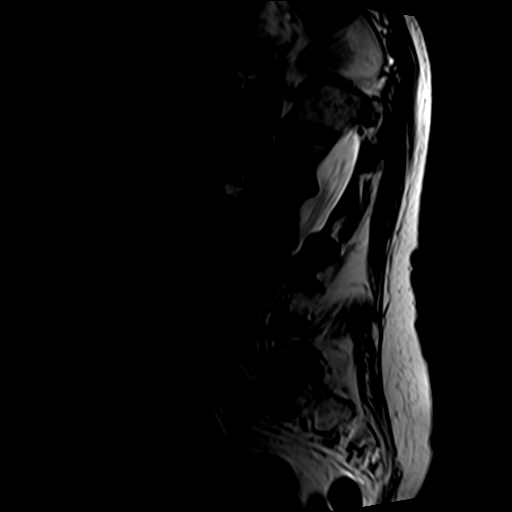
[im 8/15]
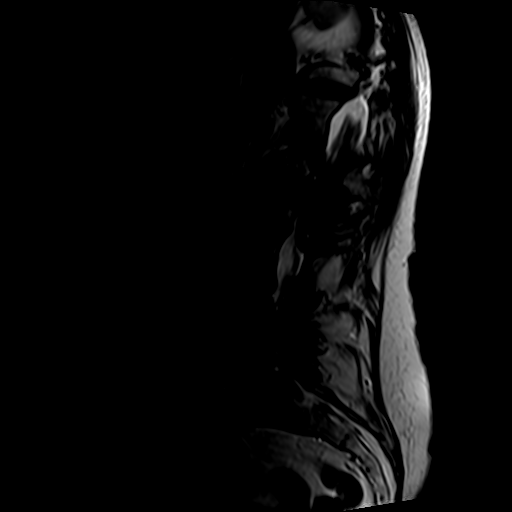
[im 10/15]
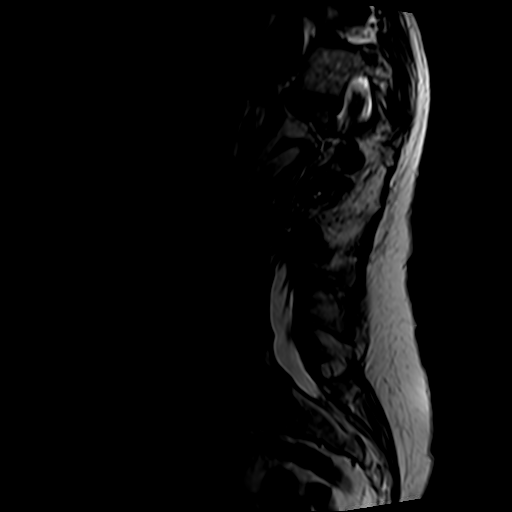
[im 12/15]
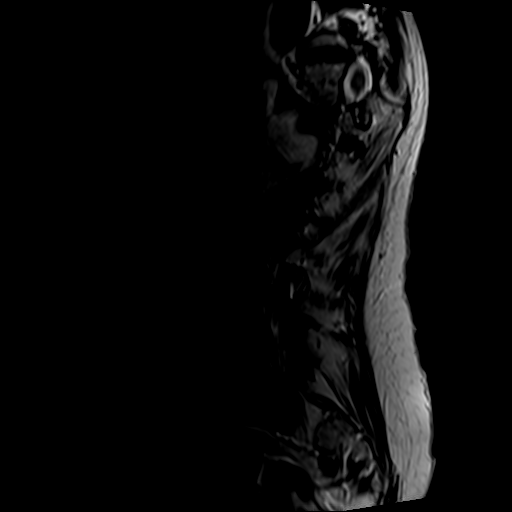
[im 15/15]
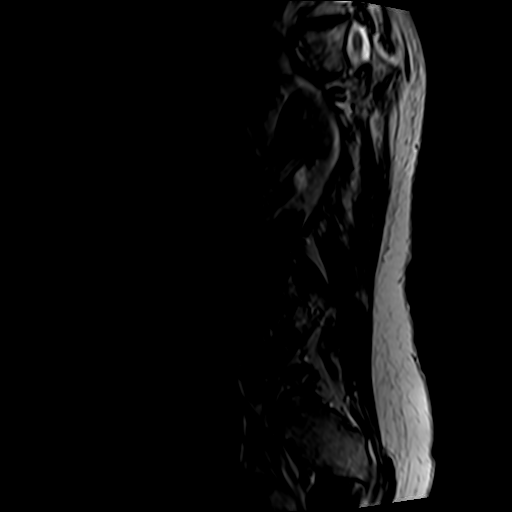

[Series 4: T1 · sagittal · 4.0mm · 0.53mm/px · 6 of 15 slices shown (1 of 2)]
[im 1/15]
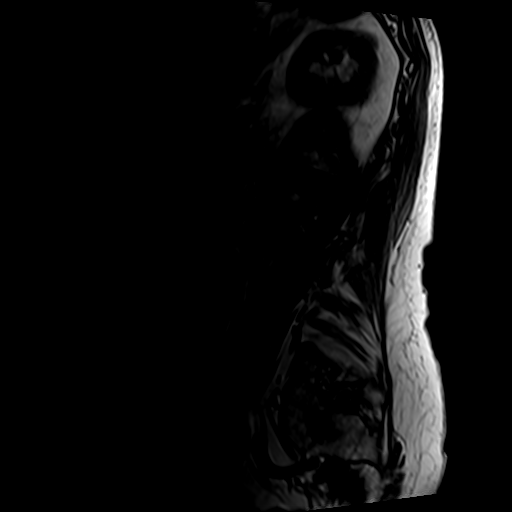
[im 3/15]
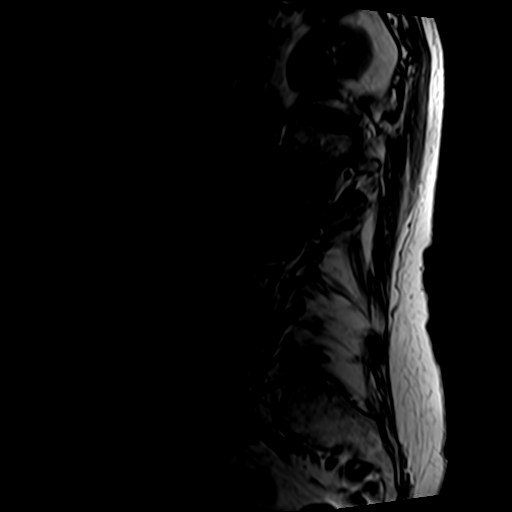
[im 6/15]
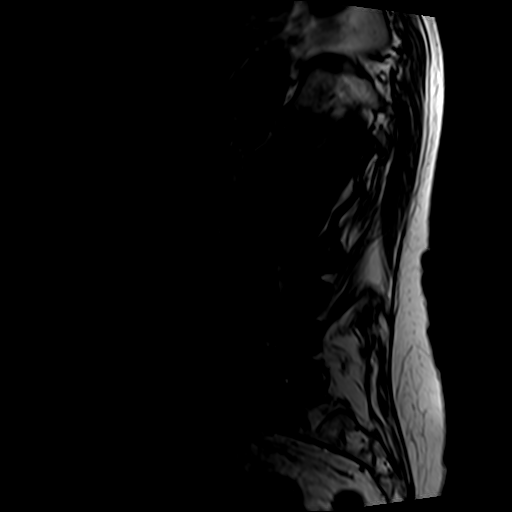
[im 9/15]
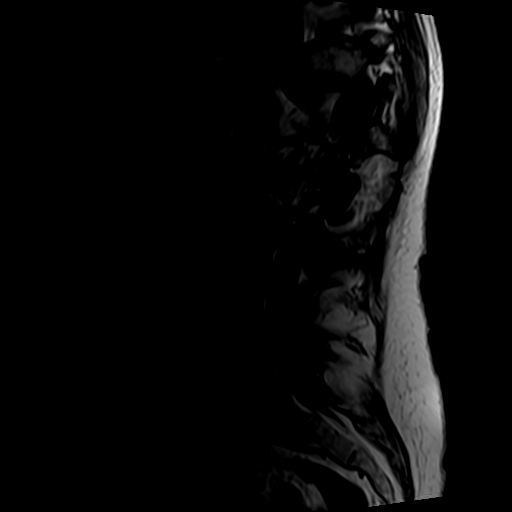
[im 12/15]
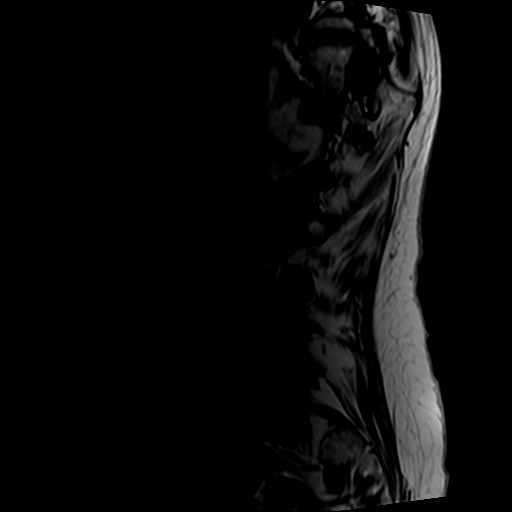
[im 15/15]
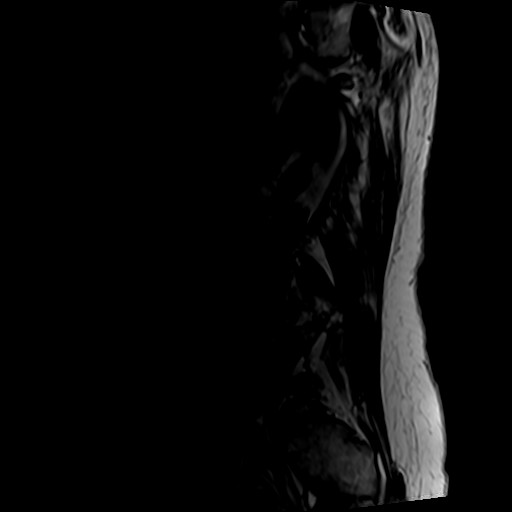

[Series 5: T2 · axial · 4.0mm · 0.70mm/px · z∈[-33,+160]mm · 8 of 34 slices shown (2 of 2)]
[im 1/34]
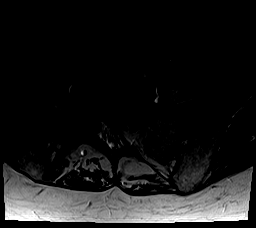
[im 6/34]
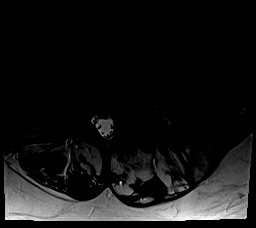
[im 11/34]
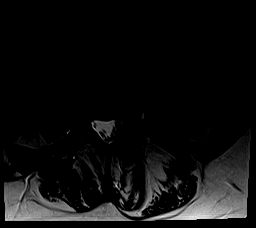
[im 16/34]
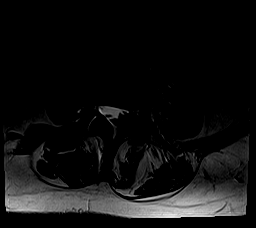
[im 18/34]
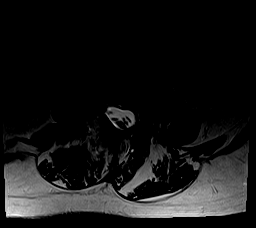
[im 23/34]
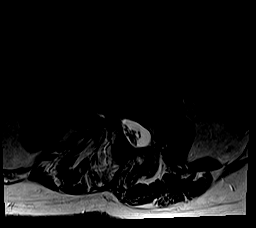
[im 28/34]
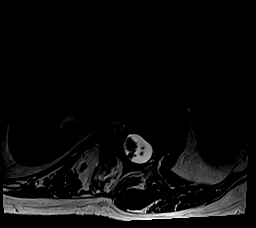
[im 34/34]
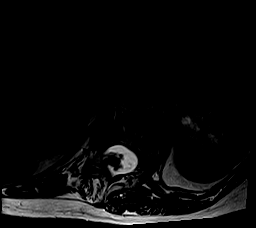

[Series 6: T1 · axial · 4.0mm · 0.35mm/px · z∈[-33,+129]mm · 4 of 34 slices shown (2 of 2)]
[im 1/34]
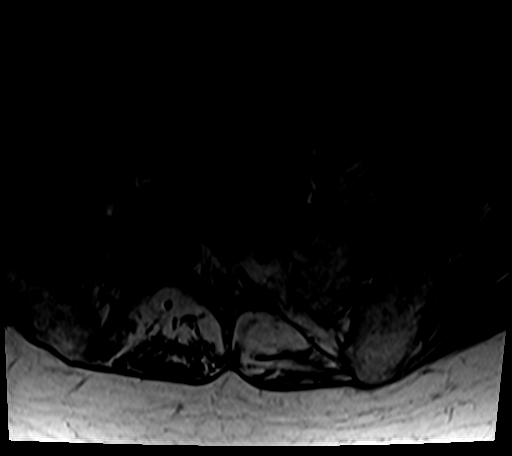
[im 6/34]
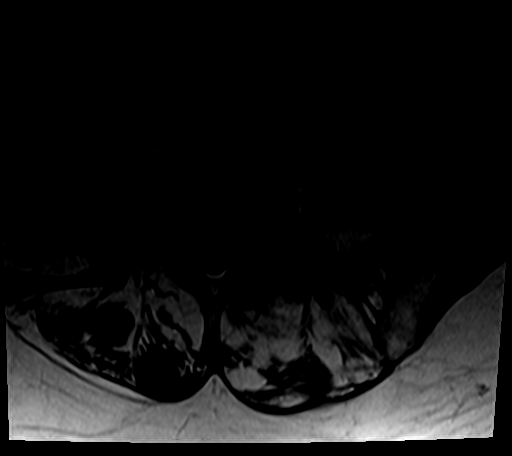
[im 18/34]
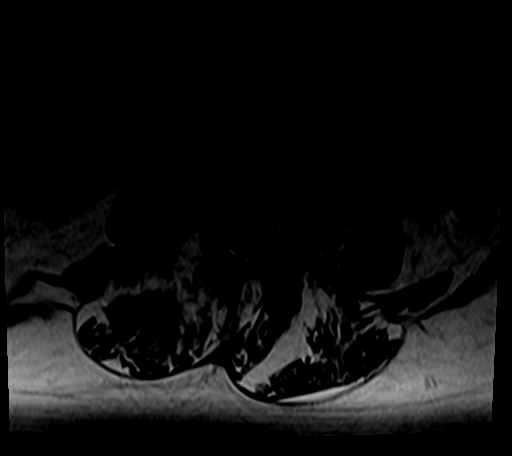
[im 28/34]
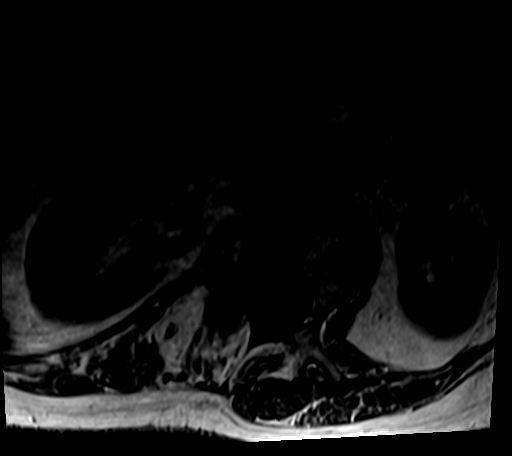

[25 of 48 positions shown; findings below may reference images not displayed]

FINDINGS: Segmentation:  5 lumbar type vertebrae based on radiography

Alignment: Marked levoscoliosis centered at the thoracolumbar
junction. There is a rotatory component.

Vertebrae:  No fracture, evidence of discitis, or bone lesion.

Conus medullaris and cauda equina: Conus extends to the L1 level.
Conus and cauda equina appear normal.

Paraspinal and other soft tissues: Negative for perispinal mass or
inflammation.

Disc levels:

T12- L1: Asymmetric right facet spurring

L1-L2: Asymmetric right facet spurring which is bulky. Right
preferential disc bulging with foraminal protrusion. Right foraminal
impingement. Right subarticular recess stenosis.

L2-L3: Disc narrowing and bulging with bilateral facet spurring.
Mild canal and foraminal narrowing

L3-L4: Lumbar facet osteoarthritis with asymmetric bulky left
spurring. Mild leftward disc bulging. No neural compression

L4-L5: Advanced asymmetric left facet osteoarthritis with bulky
spurring. Preferential leftward disc bulging. Moderate left
foraminal stenosis

L5-S1:Asymmetric left facet osteoarthritis. Left subarticular recess
stenosis impinging on the left S1 nerve root. Left foraminal
narrowing that is mild or moderate.
IMPRESSION: 1. Advanced scoliosis with alternating advanced facet osteoarthritis
and lateral disc space narrowing along the concavity of the
curvature.
2. L1-2 right foraminal impingement.
3. L4-5 moderate left foraminal narrowing.
4. L5-S1 left subarticular recess stenosis and left S1 impingement.

## 2022-01-20 IMAGING — MR MR PELVIS W/O CM
5 series · 32 of 48 positions shown · non-contrast
Comparison: None.

CLINICAL DATA: Chronic back pain, right hip pain

EXAM:
MRI PELVIS WITHOUT CONTRAST
TECHNIQUE: Multiplanar multisequence MR imaging of the pelvis was performed. No
intravenous contrast was administered.

[Series 3: STIR · coronal · 4.0mm · 0.70mm/px · 8 of 31 slices shown]
[im 1/31]
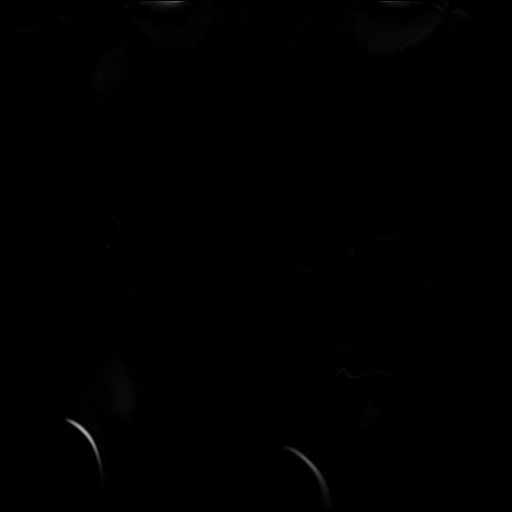
[im 5/31]
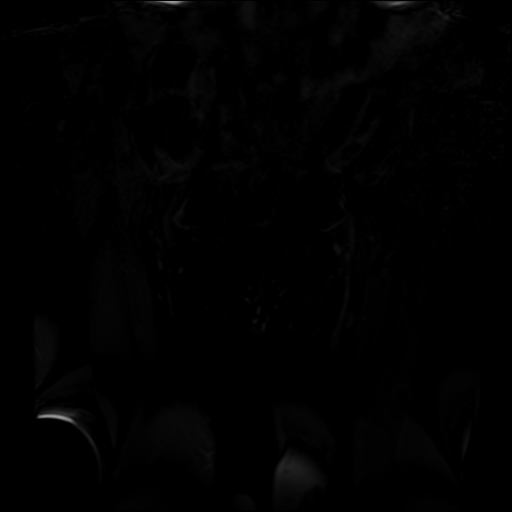
[im 9/31]
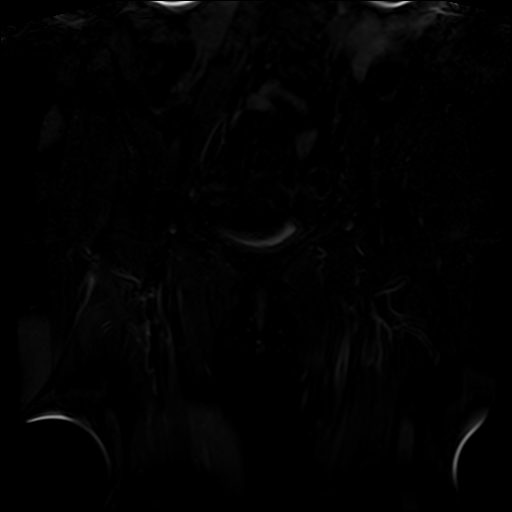
[im 13/31]
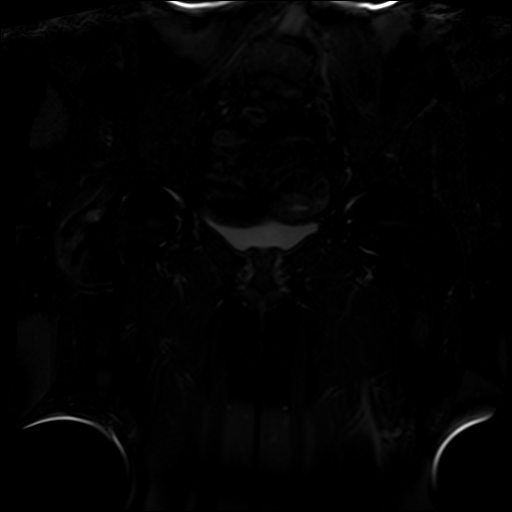
[im 18/31]
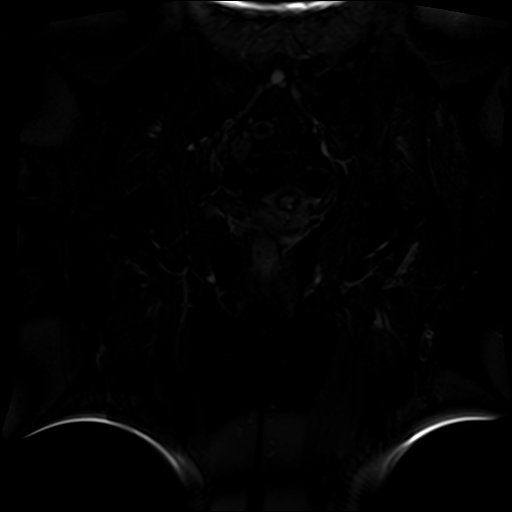
[im 22/31]
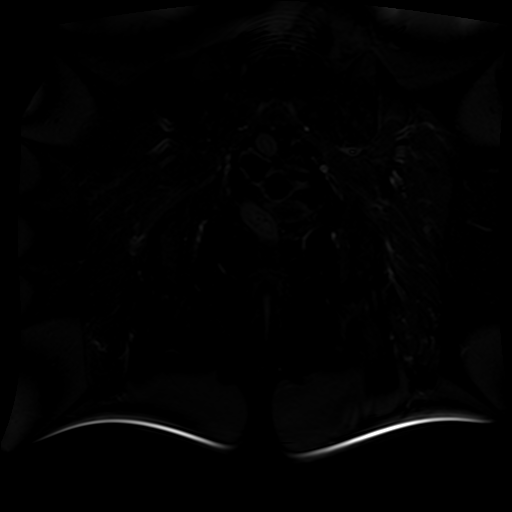
[im 26/31]
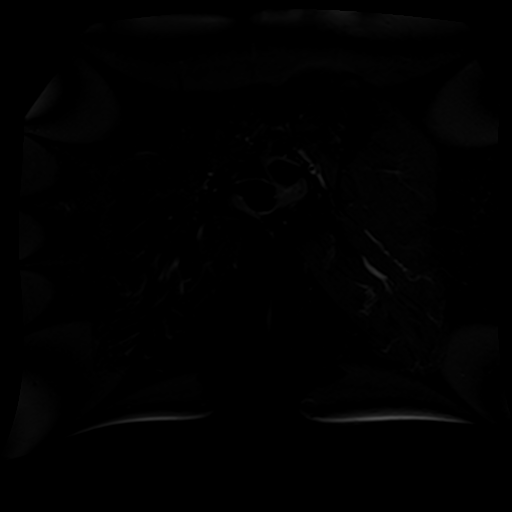
[im 31/31]
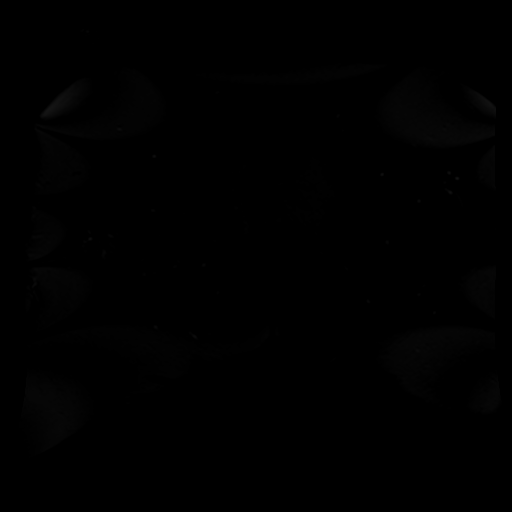

[Series 4: T1 · coronal · 4.0mm · 1.12mm/px · 7 of 31 slices shown (1 of 2)]
[im 1/31]
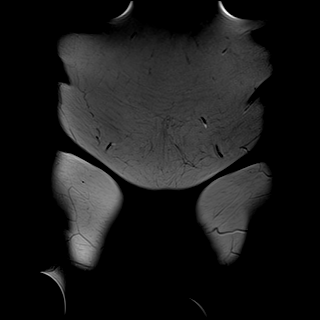
[im 6/31]
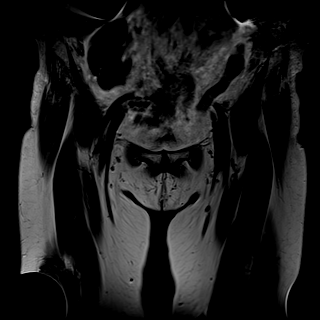
[im 11/31]
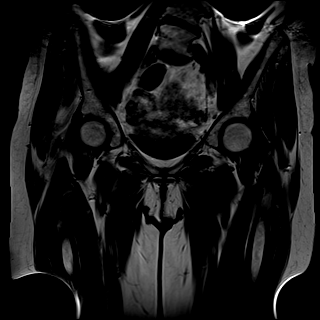
[im 16/31]
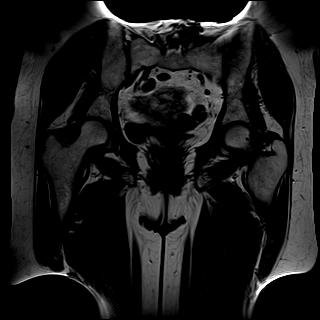
[im 21/31]
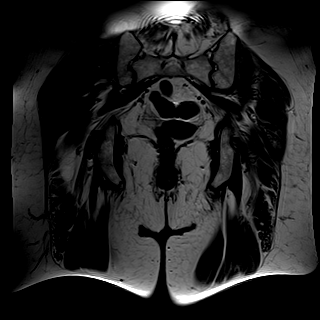
[im 26/31]
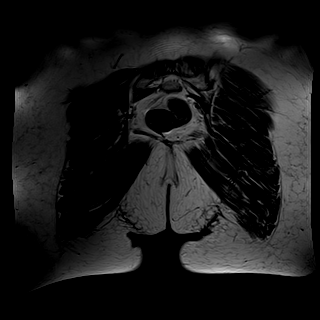
[im 31/31]
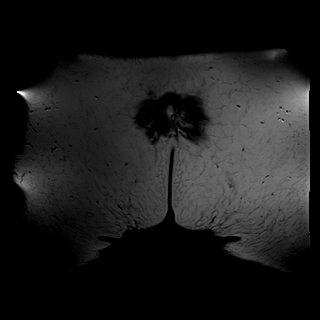

[Series 5: T1 · axial · 4.0mm · 1.00mm/px · z∈[-102,+125]mm · 8 of 43 slices shown (2 of 2)]
[im 1/43]
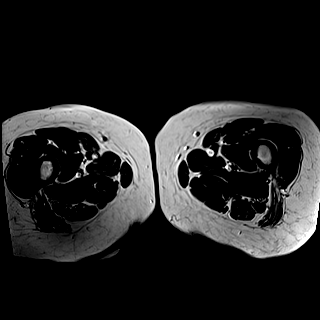
[im 5/43]
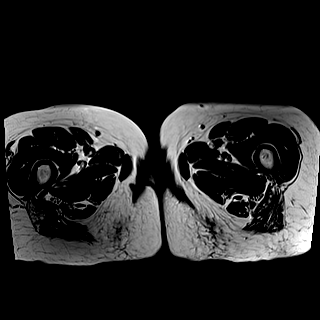
[im 15/43]
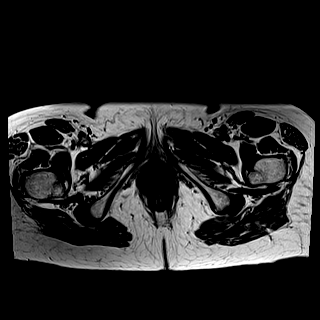
[im 19/43]
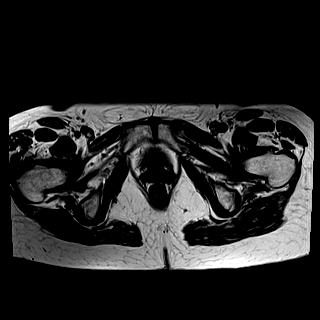
[im 24/43]
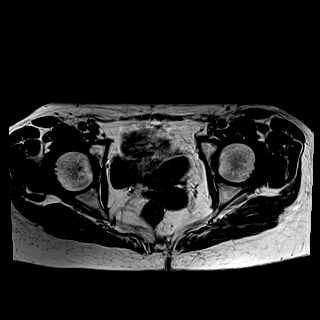
[im 29/43]
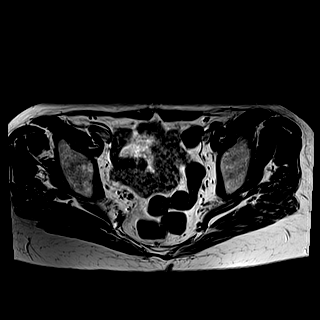
[im 38/43]
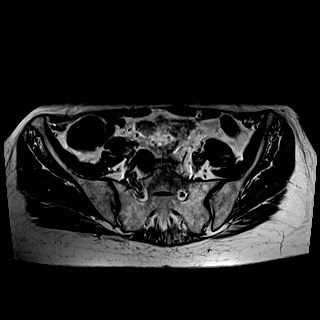
[im 43/43]
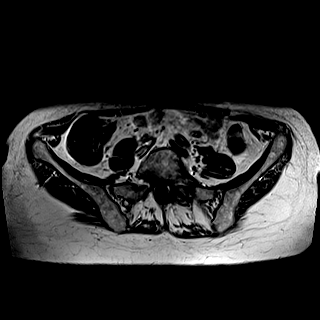

[Series 6: T2 fat-sat · axial · 4.0mm · 1.00mm/px · z∈[-102,+125]mm · 8 of 43 slices shown (1 of 2)]
[im 1/43]
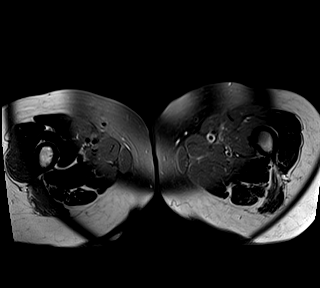
[im 5/43]
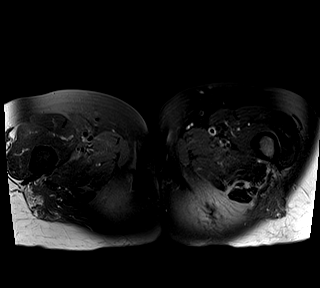
[im 15/43]
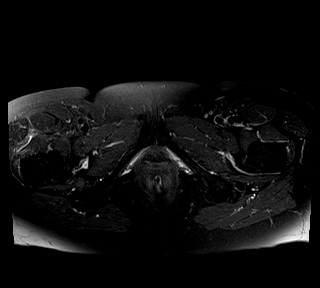
[im 19/43]
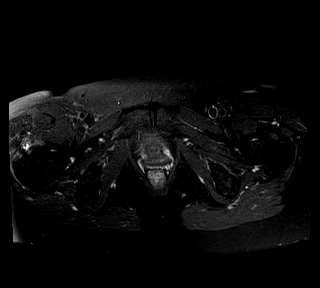
[im 24/43]
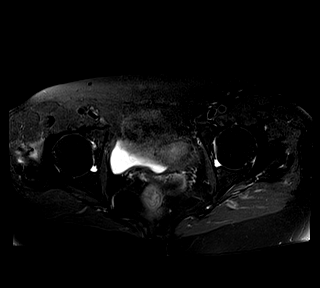
[im 29/43]
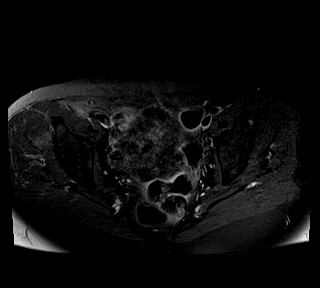
[im 38/43]
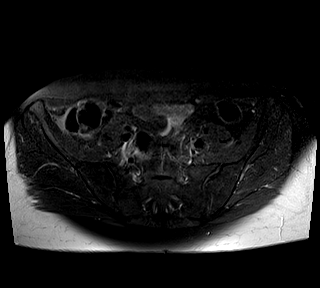
[im 43/43]
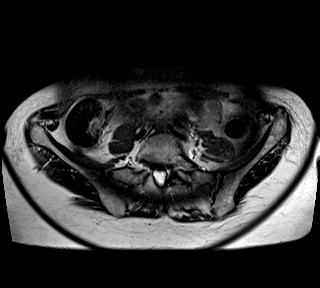

[Series 7: T2 fat-sat · sagittal · 4.0mm · 0.51mm/px · 1 of 53 slices shown (2 of 2)]
[im 1/53]
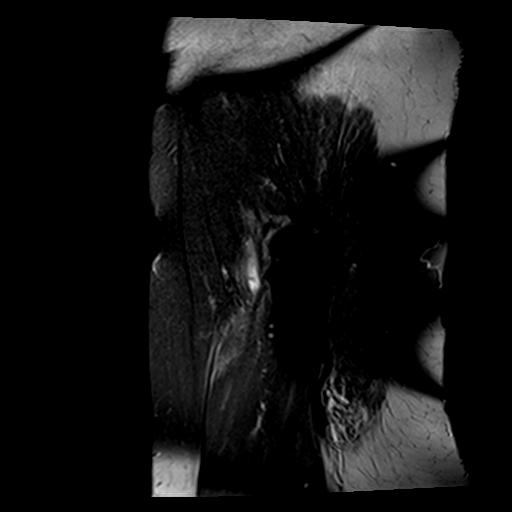

[32 of 48 positions shown; findings below may reference images not displayed]

FINDINGS: Bones:

No hip fracture, dislocation or avascular necrosis.

No periosteal reaction or bone destruction. No aggressive osseous
lesion.

Normal sacrum and sacroiliac joints. No SI joint widening or erosive
changes.

Levoscoliosis of the lumbar spine. Broad-based disc bulge at L5-S1
with bilateral mild facet arthropathy.

Articular cartilage and labrum

Articular cartilage:  No chondral defect.

Labrum: Probable small right anterior labral tear with a tiny
paralabral cyst. Left anterior labral tear with a 7 mm paralabral
cyst.

Joint or bursal effusion

Joint effusion:  No hip joint effusion.  No SI joint effusion.

Bursae:  No bursa formation.

Muscles and tendons

Flexors: Normal.

Extensors: Normal.

Abductors: Normal.

Adductors: Normal.

Gluteals: Severe tendinosis of the right gluteus minimus tendon with
a high-grade partial-thickness tear at the trochanteric insertion.

Hamstrings: Normal.

Other findings

No pelvic free fluid. No fluid collection or hematoma. No inguinal
lymphadenopathy. No inguinal hernia.
IMPRESSION: 1. Severe tendinosis of the right gluteus minimus tendon with a
high-grade partial-thickness tear at the trochanteric insertion.
2. Probable small right anterior labral tear with a tiny paralabral
cyst.
3. Left anterior labral tear with a 7 mm paralabral cyst.

## 2022-01-21 ENCOUNTER — Other Ambulatory Visit (HOSPITAL_COMMUNITY): Payer: Self-pay

## 2022-01-29 NOTE — Progress Notes (Signed)
?Charlann Boxer D.O. ?[facility] ?[place] ?Phone: [phone] ?Subjective:   ?I, Jacqualin Combes, am serving as a scribe for Dr. Hulan Saas. ? ?This visit occurred during the SARS-CoV-2 public health emergency.  Safety protocols were in place, including screening questions prior to the visit, additional usage of staff PPE, and extensive cleaning of exam room while observing appropriate contact time as indicated for disinfecting solutions.  ?I'm seeing this patient by the request  of:  [name], [name], MD ? ?CC: Right hip pain ? ?[id number]  ?01/02/2022 ?Patient given injection today and tolerated the procedure well, discussed icing regimen and home exercises.  Discussed which activities to do and which ones to avoid.  Increase activity slowly.  Differential does include some underlying arthritis of the hip and x-rays are pending.  Patient also has some severe degenerative scoliosis lumbar spine so lumbar radiculopathy could be within the differential as well if patient is not making improvement.  Patient will follow-up with me again in 6 weeks ? ?Updated 01/30/2022 ?PRINCES FINGER is a 76 y.o. female coming in with complaint of R hip pain. Patient states that the injection did not help. Continues to have pain over R GT. The pain is now radiating down the lateral aspect of the leg.  ? ?Xray no changes ? ?MRI IMPRESSION: ?1. Advanced scoliosis with alternating advanced facet osteoarthritis ?and lateral disc space narrowing along the concavity of the ?curvature. ?2. L1-2 right foraminal impingement. ?3. L4-5 moderate left foraminal narrowing. ?4. L5-S1 left subarticular recess stenosis and left S1 impingement. ? ?IMPRESSION: ?1. Severe tendinosis of the right gluteus minimus tendon with a ?high-grade partial-thickness tear at the trochanteric insertion. ?2. Probable small right anterior labral tear with a tiny paralabral ?cyst. ?3. Left anterior labral tear with a 7 mm  paralabral cyst. ? ?  ? ?Past Medical History:  ?Diagnosis Date  ? BRCA1 positive   ? Breast cancer ([place])   ? Left- Ductal ca SR-, Chemo ([facility]) rad rx   ? GERD (gastroesophageal reflux disease)   ? H/O migraine   ? HSV-2 (herpes simplex virus 2) infection   ? Hypercholesteremia   ? Rheumatoid factor positive   ? OA   had rheum consult  ? Scoliosis   ? Sprain of ankle 03/06/2015  ? revewiwed care exercise and fu if needed    ? Tinnitus   ? ?Past Surgical History:  ?Procedure Laterality Date  ? BREAST LUMPECTOMY    ? LAPAROSCOPIC BILATERAL SALPINGO OOPHERECTOMY  2006  ? Dr. Joan Flores  ? MASTECTOMY Bilateral 2006  ? ?Social History  ? ?Socioeconomic History  ? Marital status: Widowed  ?  Spouse name: Not on file  ? Number of children: Not on file  ? Years of education: Not on file  ? Highest education level: Not on file  ?Occupational History  ? Not on file  ?Tobacco Use  ? Smoking status: Former  ? Smokeless tobacco: Never  ? Tobacco comments:  ?  limited in college   ?Vaping Use  ? Vaping Use: Never used  ?Substance and Sexual Activity  ? Alcohol use: Yes  ?  Alcohol/week: 7.0 standard drinks  ?  Types: 7 Glasses of wine per week  ? Drug use: No  ? Sexual activity: Not Currently  ?  Birth control/protection: Post-menopausal  ?Other Topics Concern  ? Not on file  ?Social History Narrative  ? Married, household of 2. 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 Designated Party release signed 04/17/10.  ?   ? Husband  prostate cancer at this time. Passed  Nov 28 2020  ? hh of 1   ? ?Social Determinants of Health  ? ?Financial Resource Strain: Not on file  ?Food Insecurity: Not on file  ?Transportation Needs: Not on file  ?Physical Activity: Not on file  ?Stress: Not on file  ?Social Connections: Not on file  ? ?Allergies  ?Allergen Reactions  ? Sulfonamide Derivatives Nausea And Vomiting  ? Latex Rash  ? ?Family History  ?Problem Relation Age of Onset  ? Hypertension Mother   ? Heart disease Mother   ?  Stroke Mother   ? Arthritis Mother   ? Heart attack Brother   ? Arthritis Other   ?     maternal side RA cousin  ? Kidney disease Other   ?     maternal side  ? Diabetes Other   ?     1st degree relative, paternal side  ? Diabetes Father   ? Lung cancer Father   ? ? ? ?Current Outpatient Medications (Cardiovascular):  ?  lovastatin (MEVACOR) 20 MG tablet, Take 1 tablet (20 mg total) by mouth daily. ? ? ? ? ?Current Outpatient Medications (Other):  ?  acyclovir (ZOVIRAX) 200 MG capsule, Take 2 capsules by mouth 3 times daily for outbreaks ?  acyclovir ointment (ZOVIRAX) 5 %, Apply 1 application topically 3 (three) times daily as needed. ?  amitriptyline (ELAVIL) 10 MG tablet, Take 1 and 1/2 tablets (15 mg total) by mouth at bedtime. ?  amoxicillin-clavulanate (AUGMENTIN) 875-125 MG tablet, Take 1 tablet by mouth every 12 hours. ?  b complex vitamins tablet, Take 1 tablet by mouth daily. ?  calcium carbonate (OS-CAL) 600 MG TABS, Take 600 mg by mouth 3 (three) times daily with meals. ?  famotidine (PEPCID) 20 MG tablet, Take 20 mg by mouth 2 (two) times daily. ?  Magnesium 300 MG CAPS, Take 1 capsule by mouth daily. ?  VITAMIN A PO, Take by mouth daily. ?  vitamin E 180 MG (400 UNITS) capsule, Take 400 Units by mouth daily. ? ? ?Reviewed prior external information including notes and imaging from  ?primary care provider ?As well as notes that were available from care everywhere and other healthcare systems. ? ?Past medical history, social, surgical and family history all reviewed in electronic medical record.  No pertanent information unless stated regarding to the chief complaint.  ? ?Review of Systems: ? No headache, visual changes, nausea, vomiting, diarrhea, constipation, dizziness, abdominal pain, skin rash, fevers, chills, night sweats, weight loss, swollen lymph nodes, body aches, joint swelling, chest pain, shortness of breath, mood changes. POSITIVE muscle aches ? ?Objective  ?Blood pressure 140/86, pulse 85,  height 5' 1"  (1.549 m), weight 129 lb (58.5 kg), last menstrual period 11/24/1997, SpO2 99 %. ?  ?General: No apparent distress alert and oriented x3 mood and affect normal, dressed appropriately.  ?HEENT: Pupils equal, extraocular movements intact  ?Respiratory: Patient's speak in full sentences and does not appear short of breath  ?Cardiovascular: No lower extremity edema, non tender, no erythema  ?Gait antalgic ?Tender over the greater trochanteric area and just posterior to this.Marland Kitchen ?Patient does have some weakness of the gluteal aspect on the right compared to left.  Significant scoliosis noted of the low back. ?Neurovascular intact distally. ?  ?Impression and Recommendations:  ?  ? ?The above documentation has been reviewed and is accurate and complete Alroy Dust  Koren Bound, DO ? ? ? ?

## 2022-01-30 ENCOUNTER — Ambulatory Visit: Payer: Medicare PPO | Admitting: Family Medicine

## 2022-01-30 ENCOUNTER — Encounter: Payer: Self-pay | Admitting: Family Medicine

## 2022-01-30 ENCOUNTER — Ambulatory Visit: Payer: Self-pay

## 2022-01-30 ENCOUNTER — Other Ambulatory Visit: Payer: Self-pay

## 2022-01-30 VITALS — BP 140/86 | HR 85 | Ht 61.0 in | Wt 129.0 lb

## 2022-01-30 DIAGNOSIS — M25551 Pain in right hip: Secondary | ICD-10-CM | POA: Diagnosis not present

## 2022-01-30 DIAGNOSIS — M7061 Trochanteric bursitis, right hip: Secondary | ICD-10-CM | POA: Diagnosis not present

## 2022-01-30 NOTE — Patient Instructions (Signed)
Have a good trip ?Read about PRP ?Send message when ready ? ?

## 2022-01-30 NOTE — Assessment & Plan Note (Signed)
Patient actually has more of a gluteal tendon tear in the area.  I do think that this is contributing to more of the discomfort and pain.  Patient does have very mild weakness noted.  I do feel that this could be secondary to compensation patient's scoliosis.  Patient will like to try the potential for PRP and will consider scheduling at a later date.  We discussed the other possibility of surgical intervention or repeating physical therapy.  Patient is agreement with this.  Follow-up with me again at a later date.  Total time with patient greater than 35 minutes. ?

## 2022-02-03 NOTE — Progress Notes (Unsigned)
/ [name] [place] Phone: [phone] Subjective:    I'm seeing this patient by the request  of:  [name], [name], MD  CC:   JOA:[id number]  01/30/2022 Nicole [name] is a 76 y.o. female coming in with complaint of R hip pain. Here for PRP for GT bursitis. Patient states        Past Medical History:  Diagnosis Date   BRCA1 positive    Breast cancer ([place])    Left- Ductal ca SR-, Chemo (Magrinat) rad rx    GERD (gastroesophageal reflux disease)    H/O migraine    HSV-2 (herpes simplex virus 2) infection    Hypercholesteremia    Rheumatoid factor positive    OA   had rheum consult   Scoliosis    Sprain of ankle 03/06/2015   revewiwed care exercise and fu if needed     Tinnitus    Past Surgical History:  Procedure Laterality Date   BREAST LUMPECTOMY     LAPAROSCOPIC BILATERAL SALPINGO OOPHERECTOMY  2006   Dr. Joan Flores   MASTECTOMY Bilateral 2006   Social History   Socioeconomic History   Marital status: Widowed    Spouse name: Not on file   Number of children: Not on file   Years of education: Not on file   Highest education level: Not on file  Occupational History   Not on file  Tobacco Use   Smoking status: Former   Smokeless tobacco: Never   Tobacco comments:    limited in college   Vaping Use   Vaping Use: Never used  Substance and Sexual Activity   Alcohol use: Yes    Alcohol/week: 7.0 standard drinks    Types: 7 Glasses of wine per week   Drug use: No   Sexual activity: Not Currently    Birth control/protection: Post-menopausal  Other Topics Concern   Not on file  Social History Narrative   Married, household of 2. 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 Designated Party release signed 04/17/10.      Husband  prostate cancer at this time. Passed  Nov 28 2020   hh of 1    Social Determinants of Health   Financial Resource Strain: Not on file  Food Insecurity:  Not on file  Transportation Needs: Not on file  Physical Activity: Not on file  Stress: Not on file  Social Connections: Not on file   Allergies  Allergen Reactions   Sulfonamide Derivatives Nausea And Vomiting   Latex Rash   Family History  Problem Relation Age of Onset   Hypertension Mother    Heart disease Mother    Stroke Mother    Arthritis Mother    Heart attack Brother    Arthritis Other        maternal side RA cousin   Kidney disease Other        maternal side   Diabetes Other        1st degree relative, paternal side   Diabetes Father    Lung cancer Father      Current Outpatient Medications (Cardiovascular):    lovastatin (MEVACOR) 20 MG tablet, Take 1 tablet (20 mg total) by mouth daily.     Current Outpatient Medications (Other):    acyclovir (ZOVIRAX) 200 MG capsule, Take 2 capsules by mouth 3 times daily for outbreaks   acyclovir ointment (ZOVIRAX) 5 %, Apply 1 application topically 3 (three)  times daily as needed.   amitriptyline (ELAVIL) 10 MG tablet, Take 1 and 1/2 tablets (15 mg total) by mouth at bedtime.   amoxicillin-clavulanate (AUGMENTIN) 875-125 MG tablet, Take 1 tablet by mouth every 12 hours.   b complex vitamins tablet, Take 1 tablet by mouth daily.   calcium carbonate (OS-CAL) 600 MG TABS, Take 600 mg by mouth 3 (three) times daily with meals.   famotidine (PEPCID) 20 MG tablet, Take 20 mg by mouth 2 (two) times daily.   Magnesium 300 MG CAPS, Take 1 capsule by mouth daily.   VITAMIN A PO, Take by mouth daily.   vitamin E 180 MG (400 UNITS) capsule, Take 400 Units by mouth daily.   Reviewed prior external information including notes and imaging from  primary care provider As well as notes that were available from care everywhere and other healthcare systems.  Past medical history, social, surgical and family history all reviewed in electronic medical record.  No pertanent information unless stated regarding to the chief complaint.    Review of Systems:  No headache, visual changes, nausea, vomiting, diarrhea, constipation, dizziness, abdominal pain, skin rash, fevers, chills, night sweats, weight loss, swollen lymph nodes, body aches, joint swelling, chest pain, shortness of breath, mood changes. POSITIVE muscle aches  Objective  Last menstrual period 11/24/1997.   General: No apparent distress alert and oriented x3 mood and affect normal, dressed appropriately.  HEENT: Pupils equal, extraocular movements intact  Respiratory: Patient's speak in full sentences and does not appear short of breath  Cardiovascular: No lower extremity edema, non tender, no erythema  Gait normal with good balance and coordination.  MSK:  Non tender with full range of motion and good stability and symmetric strength and tone of shoulders, elbows, wrist, hip, knee and ankles bilaterally.     Impression and Recommendations:     The above documentation has been reviewed and is accurate and complete Jacqualin Combes

## 2022-02-06 ENCOUNTER — Other Ambulatory Visit: Payer: Self-pay

## 2022-02-06 ENCOUNTER — Ambulatory Visit (INDEPENDENT_AMBULATORY_CARE_PROVIDER_SITE_OTHER): Payer: Self-pay | Admitting: Family Medicine

## 2022-02-06 ENCOUNTER — Ambulatory Visit: Payer: Self-pay

## 2022-02-06 VITALS — BP 122/90 | HR 72 | Ht 61.0 in | Wt 127.0 lb

## 2022-02-06 DIAGNOSIS — M25551 Pain in right hip: Secondary | ICD-10-CM

## 2022-02-06 DIAGNOSIS — S76011A Strain of muscle, fascia and tendon of right hip, initial encounter: Secondary | ICD-10-CM

## 2022-02-06 NOTE — Assessment & Plan Note (Signed)
Patient given a PRP injection and tolerated the procedure well, discussed icing regimen and home exercises, discussed which activities to do which wants to avoid.  Increase activity slowly.  Follow-up again in 6 to 8 weeks. ?

## 2022-02-06 NOTE — Patient Instructions (Signed)
No ice or IBU for 3 days ?Heat and Tylenol are ok ?See me again in 5-6 weeks ?

## 2022-02-14 ENCOUNTER — Ambulatory Visit: Payer: Medicare PPO

## 2022-02-18 ENCOUNTER — Telehealth: Payer: Self-pay

## 2022-02-18 NOTE — Telephone Encounter (Signed)
This nurse attempted to call patient three times for scheduled telephonic AWV. Message left that we will call back to reschedule for another time. 

## 2022-02-20 ENCOUNTER — Ambulatory Visit: Payer: Medicare PPO

## 2022-03-04 ENCOUNTER — Telehealth: Payer: Self-pay | Admitting: Internal Medicine

## 2022-03-04 NOTE — Telephone Encounter (Signed)
Left message for patient to call back and schedule Medicare Annual Wellness Visit (AWV) either virtually or in office. Left  my jabber number [phone]   Last AWV ;06/11/21  please schedule at anytime with [facility] Nurse Health Advisor 1 or 2    

## 2022-03-07 ENCOUNTER — Ambulatory Visit (INDEPENDENT_AMBULATORY_CARE_PROVIDER_SITE_OTHER): Payer: Medicare PPO

## 2022-03-07 VITALS — Ht 61.0 in | Wt 127.0 lb

## 2022-03-07 DIAGNOSIS — Z0000 Encounter for general adult medical examination without abnormal findings: Secondary | ICD-10-CM

## 2022-03-07 NOTE — Patient Instructions (Addendum)
?Nicole [name] , ?Thank you for taking time to come for your Medicare Wellness Visit. I appreciate your ongoing commitment to your health goals. Please review the following plan we discussed and let me know if I can assist you in the future.  ? ?These are the goals we discussed: ? Goals   ? ?   Increase physical activity (pt-stated)   ?   I want keep my exercise up. ?  ?   Patient Stated   ?   I will continue to walk 1.5-2 miles daily ?  ?   Weight (lb) < 138 lb (62.6 kg)   ?   Keep going to weight watchers;  ?Check out  online nutrition programs as GumSearch.nl and http://vang.com/; fit41m; ?Look for foods with "whole" wheat; bran; oatmeal etc ?Shot at the farmer's markets in season for fresher choices  ?Watch for "hydrogenated" on the label of oils which are trans-fats.  ?Watch for "high fructose corn syrup" in snacks, yogurt or ketchup  ?Meats have less marbling; bright colored fruits and vegetables;  ?Canned; dump out liquid and wash vegetables. ?Be mindful of what we are eating  ?Portion control is essential to a health weight! ?Sit down; take a break and enjoy your meal; take smaller bites; put the fork down between bites;  ?It takes 20 minutes to get full; so check in with your fullness cues and stop eating when you start to fill full  ? ? ? ? ? ? ? ? ?  ? ?  ?  ?This is a list of the screening recommended for you and due dates:  ?Health Maintenance  ?Topic Date Due  ? Colon Cancer Screening  06/18/2021  ? COVID-19 Vaccine (4 - Booster for Moderna series) 03/23/2022*  ? Flu Shot  06/24/2022  ? Tetanus Vaccine  04/14/2024  ? Pneumonia Vaccine  Completed  ? DEXA scan (bone density measurement)  Completed  ? Hepatitis C Screening: USPSTF Recommendation to screen - Ages 128-79yo.  Completed  ? Zoster (Shingles) Vaccine  Completed  ? HPV Vaccine  Aged Out  ?*Topic was postponed. The date shown is not the original due date.  ?  ?Advanced directives: Yes ? ?Conditions/risks identified: None ? ?Next appointment:  Follow up in one year for your annual wellness visit  ? ? ?Preventive Care 75Years and Older, Female ?Preventive care refers to lifestyle choices and visits with your health care provider that can promote health and wellness. ?What does preventive care include? ?A yearly physical exam. This is also called an annual well check. ?Dental exams once or twice a year. ?Routine eye exams. Ask your health care provider how often you should have your eyes checked. ?Personal lifestyle choices, including: ?Daily care of your teeth and gums. ?Regular physical activity. ?Eating a healthy diet. ?Avoiding tobacco and drug use. ?Limiting alcohol use. ?Practicing safe sex. ?Taking low-dose aspirin every day. ?Taking vitamin and mineral supplements as recommended by your health care provider. ?What happens during an annual well check? ?The services and screenings done by your health care provider during your annual well check will depend on your age, overall health, lifestyle risk factors, and family history of disease. ?Counseling  ?Your health care provider may ask you questions about your: ?Alcohol use. ?Tobacco use. ?Drug use. ?Emotional well-being. ?Home and relationship well-being. ?Sexual activity. ?Eating habits. ?History of falls. ?Memory and ability to understand (cognition). ?Work and work eStatistician ?Reproductive health. ?Screening  ?You may have the following tests or measurements: ?Height,  weight, and BMI. ?Blood pressure. ?Lipid and cholesterol levels. These may be checked every 5 years, or more frequently if you are over 19 years old. ?Skin check. ?Lung cancer screening. You may have this screening every year starting at age 42 if you have a 30-pack-year history of smoking and currently smoke or have quit within the past 15 years. ?Fecal occult blood test (FOBT) of the stool. You may have this test every year starting at age 72. ?Flexible sigmoidoscopy or colonoscopy. You may have a sigmoidoscopy every 5 years or a  colonoscopy every 10 years starting at age 29. ?Hepatitis C blood test. ?Hepatitis B blood test. ?Sexually transmitted disease (STD) testing. ?Diabetes screening. This is done by checking your blood sugar (glucose) after you have not eaten for a while (fasting). You may have this done every 1-3 years. ?Bone density scan. This is done to screen for osteoporosis. You may have this done starting at age 58. ?Mammogram. This may be done every 1-2 years. Talk to your health care provider about how often you should have regular mammograms. ?Talk with your health care provider about your test results, treatment options, and if necessary, the need for more tests. ?Vaccines  ?Your health care provider may recommend certain vaccines, such as: ?Influenza vaccine. This is recommended every year. ?Tetanus, diphtheria, and acellular pertussis (Tdap, Td) vaccine. You may need a Td booster every 10 years. ?Zoster vaccine. You may need this after age 78. ?Pneumococcal 13-valent conjugate (PCV13) vaccine. One dose is recommended after age 27. ?Pneumococcal polysaccharide (PPSV23) vaccine. One dose is recommended after age 12. ?Talk to your health care provider about which screenings and vaccines you need and how often you need them. ?This information is not intended to replace advice given to you by your health care provider. Make sure you discuss any questions you have with your health care provider. ?Document Released: 12/07/2015 Document Revised: 07/30/2016 Document Reviewed: 09/11/2015 ?Elsevier Interactive Patient Education ? 2017 [place]. ? ?Fall Prevention in the Home ?Falls can cause injuries. They can happen to people of all ages. There are many things you can do to make your home safe and to help prevent falls. ?What can I do on the outside of my home? ?Regularly fix the edges of walkways and driveways and fix any cracks. ?Remove anything that might make you trip as you walk through a door, such as a raised step or  threshold. ?Trim any bushes or trees on the path to your home. ?Use bright outdoor lighting. ?Clear any walking paths of anything that might make someone trip, such as rocks or tools. ?Regularly check to see if handrails are loose or broken. Make sure that both sides of any steps have handrails. ?Any raised decks and porches should have guardrails on the edges. ?Have any leaves, snow, or ice cleared regularly. ?Use sand or salt on walking paths during winter. ?Clean up any spills in your garage right away. This includes oil or grease spills. ?What can I do in the bathroom? ?Use night lights. ?Install grab bars by the toilet and in the tub and shower. Do not use towel bars as grab bars. ?Use non-skid mats or decals in the tub or shower. ?If you need to sit down in the shower, use a plastic, non-slip stool. ?Keep the floor dry. Clean up any water that spills on the floor as soon as it happens. ?Remove soap buildup in the tub or shower regularly. ?Attach bath mats securely with double-sided non-slip rug tape. ?  Do not have throw rugs and other things on the floor that can make you trip. ?What can I do in the bedroom? ?Use night lights. ?Make sure that you have a light by your bed that is easy to reach. ?Do not use any sheets or blankets that are too big for your bed. They should not hang down onto the floor. ?Have a firm chair that has side arms. You can use this for support while you get dressed. ?Do not have throw rugs and other things on the floor that can make you trip. ?What can I do in the kitchen? ?Clean up any spills right away. ?Avoid walking on wet floors. ?Keep items that you use a lot in easy-to-reach places. ?If you need to reach something above you, use a strong step stool that has a grab bar. ?Keep electrical cords out of the way. ?Do not use floor polish or wax that makes floors slippery. If you must use wax, use non-skid floor wax. ?Do not have throw rugs and other things on the floor that can make you  trip. ?What can I do with my stairs? ?Do not leave any items on the stairs. ?Make sure that there are handrails on both sides of the stairs and use them. Fix handrails that are broken or loose. Make sure

## 2022-03-07 NOTE — Progress Notes (Signed)
? ?Subjective:  ? Nicole [name] is a 76 y.o. female who presents for Medicare Annual (Subsequent) preventive examination. ? ?Review of Systems    ?Virtual Visit via Telephone Note ? ?I connected with  [name] on 03/07/22 at 12:30 PM EDT by telephone and verified that I am speaking with the correct person using two identifiers. ? ?Location: ?Patient: Home ?Provider: Office ?Persons participating in the virtual visit: patient/Nurse Health Advisor ?  ?I discussed the limitations, risks, security and privacy concerns of performing an evaluation and management service by telephone and the availability of in person appointments. The patient expressed understanding and agreed to proceed. ? ?Interactive audio and video telecommunications were attempted between this nurse and patient, however failed, due to patient having technical difficulties OR patient did not have access to video capability.  We continued and completed visit with audio only. ? ?Some vital signs may be absent or patient reported.  ? ?[name], LPN  ?Cardiac Risk Factors include: advanced age (>58mn, >>11women) ? ?   ?Objective:  ?  ?Today's Vitals  ? 03/07/22 1238  ?Weight: 127 lb (57.6 kg)  ?Height: _0  (1.549 m)  ? ?Body mass index is 24 kg/m?. ? ? ?  03/07/2022  ? 12:53 PM 08/12/2021  ? 11:15 AM 07/05/2021  ?  6:30 PM 03/06/2021  ?  3:11 PM 06/11/2020  ? 11:20 AM 08/11/2019  ?  2:53 PM 03/18/2017  ?  3:47 PM  ?Advanced Directives  ?Does Patient Have a Medical Advance Directive? Yes Yes No No Yes Yes Yes  ?Type of AP[place]amed[place]of [place]ates[place] will [place] will   [place] will HCedar CrestLiving will   ?Does patient want to make changes to medical advance directive? No - Patient declined    No - Patient declined    ?Copy of [place]? No - copy requested    No - copy requested No - copy requested   ? ? ?Current  Medications (verified) ?Outpatient Encounter Medications as of 03/07/2022  ?Medication Sig  ? acyclovir (ZOVIRAX) 200 MG capsule Take 2 capsules by mouth 3 times daily for outbreaks  ? acyclovir ointment (ZOVIRAX) 5 % Apply 1 application topically 3 (three) times daily as needed.  ? amitriptyline (ELAVIL) 10 MG tablet Take 1 and 1/2 tablets (15 mg total) by mouth at bedtime.  ? amoxicillin-clavulanate (AUGMENTIN) 875-125 MG tablet Take 1 tablet by mouth every 12 hours.  ? b complex vitamins tablet Take 1 tablet by mouth daily.  ? calcium carbonate (OS-CAL) 600 MG TABS Take 600 mg by mouth 3 (three) times daily with meals.  ? famotidine (PEPCID) 20 MG tablet Take 20 mg by mouth 2 (two) times daily.  ? lovastatin (MEVACOR) 20 MG tablet Take 1 tablet (20 mg total) by mouth daily.  ? Magnesium 300 MG CAPS Take 1 capsule by mouth daily.  ? VITAMIN A PO Take by mouth daily.  ? vitamin E 180 MG (400 UNITS) capsule Take 400 Units by mouth daily.  ? ?No facility-administered encounter medications on file as of 03/07/2022.  ? ? ?Allergies (verified) ?Sulfonamide derivatives and Latex  ? ?History: ?Past Medical History:  ?Diagnosis Date  ? BRCA1 positive   ? [place]   ? Left- Ductal ca SR-, Chemo (Magrinat) rad rx   ? GERD (gastroesophageal reflux disease)   ? H/O migraine   ? HSV-2 (herpes simplex virus  2) infection   ? Hypercholesteremia   ? Rheumatoid factor positive   ? OA   had rheum consult  ? Scoliosis   ? Sprain of ankle 03/06/2015  ? revewiwed care exercise and fu if needed    ? Tinnitus   ? ?Past Surgical History:  ?Procedure Laterality Date  ? BREAST LUMPECTOMY    ? LAPAROSCOPIC BILATERAL SALPINGO OOPHERECTOMY  2006  ? Dr. Joan Flores  ? MASTECTOMY Bilateral 2006  ? ?Family History  ?Problem Relation Age of Onset  ? Hypertension Mother   ? Heart disease Mother   ? Stroke Mother   ? Arthritis Mother   ? Heart attack Brother   ? Arthritis Other   ?     maternal side RA cousin  ? Kidney disease Other   ?      maternal side  ? Diabetes Other   ?     1st degree relative, paternal side  ? Diabetes Father   ? Lung cancer Father   ? ?Social History  ? ?Socioeconomic History  ? Marital status: Widowed  ?  Spouse name: Not on file  ? Number of children: Not on file  ? Years of education: Not on file  ? Highest education level: Not on file  ?Occupational History  ? Not on file  ?Tobacco Use  ? Smoking status: Former  ? Smokeless tobacco: Never  ? Tobacco comments:  ?  limited in college   ?Vaping Use  ? Vaping Use: Never used  ?Substance and Sexual Activity  ? Alcohol use: Yes  ?  Alcohol/week: 7.0 standard drinks  ?  Types: 7 Glasses of wine per week  ? Drug use: No  ? Sexual activity: Not Currently  ?  Birth control/protection: Post-menopausal  ?Other Topics Concern  ? Not on file  ?Social History Narrative  ? Married, household of 2. 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 Designated Party release signed 04/17/10.  ?   ? Husband  prostate cancer at this time. Passed  Nov 28 2020  ? hh of 1   ? ?Social Determinants of Health  ? ?Financial Resource Strain: Low Risk   ? Difficulty of Paying Living Expenses: Not hard at all  ?Food Insecurity: No Food Insecurity  ? Worried About Charity fundraiser in the Last Year: Never true  ? Ran Out of Food in the Last Year: Never true  ?Transportation Needs: No Transportation Needs  ? Lack of Transportation (Medical): No  ? Lack of Transportation (Non-Medical): No  ?Physical Activity: Insufficiently Active  ? Days of Exercise per Week: 3 days  ? Minutes of Exercise per Session: 30 min  ?Stress: Stress Concern Present  ? Feeling of Stress : To some extent  ?Social Connections: Socially Isolated  ? Frequency of Communication with Friends and Family: More than three times a week  ? Frequency of Social Gatherings with Friends and Family: More than three times a week  ? Attends Religious Services: Never  ? Active Member of Clubs or Organizations: No  ? Attends Theatre manager Meetings: Never  ? Marital Status: Widowed  ? ? ? ?Clinical Intake: ? ?Pre-visit preparation completed: No ?Diabetic?  No ? ?Interpreter Needed?: No ? ? ?Activities of Daily Living ? ?  03/07/2022  ? 12:49 PM  ?In your present state of health, do you have any difficulty performing the following activities:  ?Hearing? 0  ?Vision? 0  ?Difficulty concentrating or making decisions? 0  ?Walking  or climbing stairs? 0  ?Dressing or bathing? 0  ?Doing errands, shopping? 0  ?Preparing Food and eating ? N  ?In the past six months, have you accidently leaked urine? N  ?Do you have problems with loss of bowel control? N  ?Managing your Medications? N  ?Managing your Finances? N  ?Housekeeping or managing your Housekeeping? N  ? ? ?Patient Care Team: ?[name], [name], MD as PCP - General ?Bo Merino, MD (Rheumatology) ?Ronald Lobo, MD (Gastroenterology) ?Phylliss Bob, MD as Consulting Physician (Orthopedic Surgery) ?Milly Jakob, MD as Consulting Physician (Orthopedic Surgery) ?Marygrace Drought, MD as Consulting Physician (Ophthalmology) ? ?Indicate any recent Medical Services you may have received from other than [facility] providers in the past year (date may be approximate). ? ?   ?Assessment:  ? This is a routine wellness examination for Lisette. ? ?Hearing/Vision screen ?Hearing Screening - Comments:: No difficulty hearing ?Vision Screening - Comments:: Wears glasses followed by Dr Satira Sark ? ?Dietary issues and exercise activities discussed: ?Exercise limited by: None identified ? ? Goals Addressed   ? ?  ?  ?  ?  ?  ? This Visit's Progress  ?   Increase physical activity (pt-stated)     ?   I want keep my exercise up. ?  ? ?  ? ?Depression Screen ? ?  03/07/2022  ? 12:45 PM 06/12/2021  ? 11:33 AM 06/11/2020  ? 11:23 AM 06/11/2020  ? 10:08 AM 03/24/2018  ?  9:40 AM 03/18/2017  ?  3:51 PM 03/17/2016  ?  2:34 PM  ?PHQ 2/9 Scores  ?PHQ - 2 Score 0 1 0 0 0 0 0  ?PHQ- 9 Score  3 0 0     ?  ?Fall Risk ? ?  03/07/2022  ?  12:50 PM 08/12/2021  ? 11:14 AM 06/12/2021  ?  9:52 AM 06/11/2020  ? 11:22 AM 03/24/2018  ?  9:40 AM  ?Fall Risk   ?Falls in the past year? 0 _0 Yes  ?Comment    Tripped over dishwasher door   ?Number falls in past yr:

## 2022-03-12 ENCOUNTER — Other Ambulatory Visit (HOSPITAL_COMMUNITY): Payer: Self-pay

## 2022-03-18 NOTE — Progress Notes (Signed)
?Charlann Boxer D.O. ?[facility] ?[place] ?Phone: [phone] ?Subjective:   ?I, Nicole [name], am serving as a scribe for Dr. Hulan Saas. ? ?This visit occurred during the SARS-CoV-2 public health emergency.  Safety protocols were in place, including screening questions prior to the visit, additional usage of staff PPE, and extensive cleaning of exam room while observing appropriate contact time as indicated for disinfecting solutions.  ? ? ?I'm seeing this patient by the request  of:  [name], [name], MD ? ?CC: PRP follow-up for the glutes as well as new onset of knee pain ? ?[id number]  ?02/06/2022 ?Patient given a PRP injection and tolerated the procedure well, discussed icing regimen and home exercises, discussed which activities to do which wants to avoid.  Increase activity slowly.  Follow-up again in 6 to 8 weeks. ? ?Updated 03/19/2022 ?Nicole [name] is a 76 y.o. female coming in with complaint of R hip pain. PRP f/u. Patient states that her pain subsided post injection. Feels that she has weakness in R groin.  ? ?Chronic R knee pain. Has achiness seated in standing. Some days she does not have pain. Patient feels like she has arthritis and sometimes has pain in L knee as well.  ? ? ?  ? ?Past Medical History:  ?Diagnosis Date  ? BRCA1 positive   ? Breast cancer ([place])   ? Left- Ductal ca SR-, Chemo ([facility]) rad rx   ? GERD (gastroesophageal reflux disease)   ? H/O migraine   ? HSV-2 (herpes simplex virus 2) infection   ? Hypercholesteremia   ? Rheumatoid factor positive   ? OA   had rheum consult  ? Scoliosis   ? Sprain of ankle 03/06/2015  ? revewiwed care exercise and fu if needed    ? Tinnitus   ? ?Past Surgical History:  ?Procedure Laterality Date  ? BREAST LUMPECTOMY    ? LAPAROSCOPIC BILATERAL SALPINGO OOPHERECTOMY  2006  ? Dr. Joan Flores  ? MASTECTOMY Bilateral 2006  ? ?Social History  ? ?Socioeconomic History  ? Marital status: Widowed  ?  Spouse name:  Not on file  ? Number of children: Not on file  ? Years of education: Not on file  ? Highest education level: Not on file  ?Occupational History  ? Not on file  ?Tobacco Use  ? Smoking status: Former  ? Smokeless tobacco: Never  ? Tobacco comments:  ?  limited in college   ?Vaping Use  ? Vaping Use: Never used  ?Substance and Sexual Activity  ? Alcohol use: Yes  ?  Alcohol/week: 7.0 standard drinks  ?  Types: 7 Glasses of wine per week  ? Drug use: No  ? Sexual activity: Not Currently  ?  Birth control/protection: Post-menopausal  ?Other Topics Concern  ? Not on file  ?Social History Narrative  ? Married, household of 2. 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 Designated Party release signed 04/17/10.  ?   ? Husband  prostate cancer at this time. Passed  Nov 28 2020  ? hh of 1   ? ?Social Determinants of Health  ? ?Financial Resource Strain: Low Risk   ? Difficulty of Paying Living Expenses: Not hard at all  ?Food Insecurity: No Food Insecurity  ? Worried About Charity fundraiser in the Last Year: Never true  ? Ran Out of Food in the Last Year: Never true  ?Transportation Needs: No Transportation Needs  ? Lack  of Transportation (Medical): No  ? Lack of Transportation (Non-Medical): No  ?Physical Activity: Insufficiently Active  ? Days of Exercise per Week: 3 days  ? Minutes of Exercise per Session: 30 min  ?Stress: Stress Concern Present  ? Feeling of Stress : To some extent  ?Social Connections: Socially Isolated  ? Frequency of Communication with Friends and Family: More than three times a week  ? Frequency of Social Gatherings with Friends and Family: More than three times a week  ? Attends Religious Services: Never  ? Active Member of Clubs or Organizations: No  ? Attends Archivist Meetings: Never  ? Marital Status: Widowed  ? ?Allergies  ?Allergen Reactions  ? Sulfonamide Derivatives Nausea And Vomiting  ? Latex Rash  ? ?Family History  ?Problem Relation Age of Onset  ?  Hypertension Mother   ? Heart disease Mother   ? Stroke Mother   ? Arthritis Mother   ? Heart attack Brother   ? Arthritis Other   ?     maternal side RA cousin  ? Kidney disease Other   ?     maternal side  ? Diabetes Other   ?     1st degree relative, paternal side  ? Diabetes Father   ? Lung cancer Father   ? ? ? ?Current Outpatient Medications (Cardiovascular):  ?  lovastatin (MEVACOR) 20 MG tablet, Take 1 tablet (20 mg total) by mouth daily. ? ? ? ? ?Current Outpatient Medications (Other):  ?  acyclovir (ZOVIRAX) 200 MG capsule, Take 2 capsules by mouth 3 times daily for outbreaks ?  acyclovir ointment (ZOVIRAX) 5 %, Apply 1 application topically 3 (three) times daily as needed. ?  amitriptyline (ELAVIL) 10 MG tablet, Take 1 and 1/2 tablets (15 mg total) by mouth at bedtime. ?  b complex vitamins tablet, Take 1 tablet by mouth once a week. ?  calcium carbonate (OS-CAL) 600 MG TABS, Take 600 mg by mouth 3 (three) times daily with meals. ?  famotidine (PEPCID) 20 MG tablet, Take 20 mg by mouth 2 (two) times daily. ?  Magnesium 300 MG CAPS, Take 1 capsule by mouth daily. ?  VITAMIN A PO, Take by mouth 3 (three) times a week. ? ? ?Reviewed prior external information including notes and imaging from  ?primary care provider ?As well as notes that were available from care everywhere and other healthcare systems. ? ?Past medical history, social, surgical and family history all reviewed in electronic medical record.  No pertanent information unless stated regarding to the chief complaint.  ? ?Review of Systems: ? No headache, visual changes, nausea, vomiting, diarrhea, constipation, dizziness, abdominal pain, skin rash, fevers, chills, night sweats, weight loss, swollen lymph nodes, body aches, joint swelling, chest pain, shortness of breath, mood changes. POSITIVE muscle aches ? ?Objective  ?Blood pressure 122/84, pulse 81, height _0  (1.549 m), weight 128 lb (58.1 kg), last menstrual period 11/24/1997, SpO2 94 %. ?   ?General: No apparent distress alert and oriented x3 mood and affect normal, dressed appropriately.  ?HEENT: Pupils equal, extraocular movements intact  ?Respiratory: Patient's speak in full sentences and does not appear short of breath  ?Cardiovascular: No lower extremity edema, non tender, no erythema  ?Gait normal with good balance and coordination.  ?MSK: Improvement in the gluteal area to tenderness as well as over the greater trochanteric area. ?Right knee exam shows lateral tracking of the patella noted.  Patient does have some mild tenderness to palpation.  No  instability with valgus or varus force ? ?  ?Impression and Recommendations:  ?  ?The above documentation has been reviewed and is accurate and complete Lyndal Pulley, DO ? ? ? ? ?

## 2022-03-19 ENCOUNTER — Ambulatory Visit: Payer: Medicare PPO | Admitting: Family Medicine

## 2022-03-19 ENCOUNTER — Ambulatory Visit (INDEPENDENT_AMBULATORY_CARE_PROVIDER_SITE_OTHER): Payer: Medicare PPO

## 2022-03-19 ENCOUNTER — Ambulatory Visit: Payer: Self-pay

## 2022-03-19 VITALS — BP 122/84 | HR 81 | Ht 61.0 in | Wt 128.0 lb

## 2022-03-19 DIAGNOSIS — M7061 Trochanteric bursitis, right hip: Secondary | ICD-10-CM

## 2022-03-19 DIAGNOSIS — M25561 Pain in right knee: Secondary | ICD-10-CM

## 2022-03-19 DIAGNOSIS — G8929 Other chronic pain: Secondary | ICD-10-CM

## 2022-03-19 DIAGNOSIS — M1711 Unilateral primary osteoarthritis, right knee: Secondary | ICD-10-CM | POA: Diagnosis not present

## 2022-03-19 DIAGNOSIS — M25551 Pain in right hip: Secondary | ICD-10-CM | POA: Diagnosis not present

## 2022-03-19 DIAGNOSIS — S76011A Strain of muscle, fascia and tendon of right hip, initial encounter: Secondary | ICD-10-CM | POA: Diagnosis not present

## 2022-03-19 DIAGNOSIS — I8393 Asymptomatic varicose veins of bilateral lower extremities: Secondary | ICD-10-CM

## 2022-03-19 DIAGNOSIS — M25562 Pain in left knee: Secondary | ICD-10-CM

## 2022-03-19 IMAGING — DX DG KNEE STANDING AP BILAT
1 series · 1 of 1 positions shown · non-contrast
Comparison: Left knee radiographs [DATE]

CLINICAL DATA: Chronic bilateral knee pain.

EXAM:
BILATERAL KNEES STANDING - 1 VIEW

[knee ap]
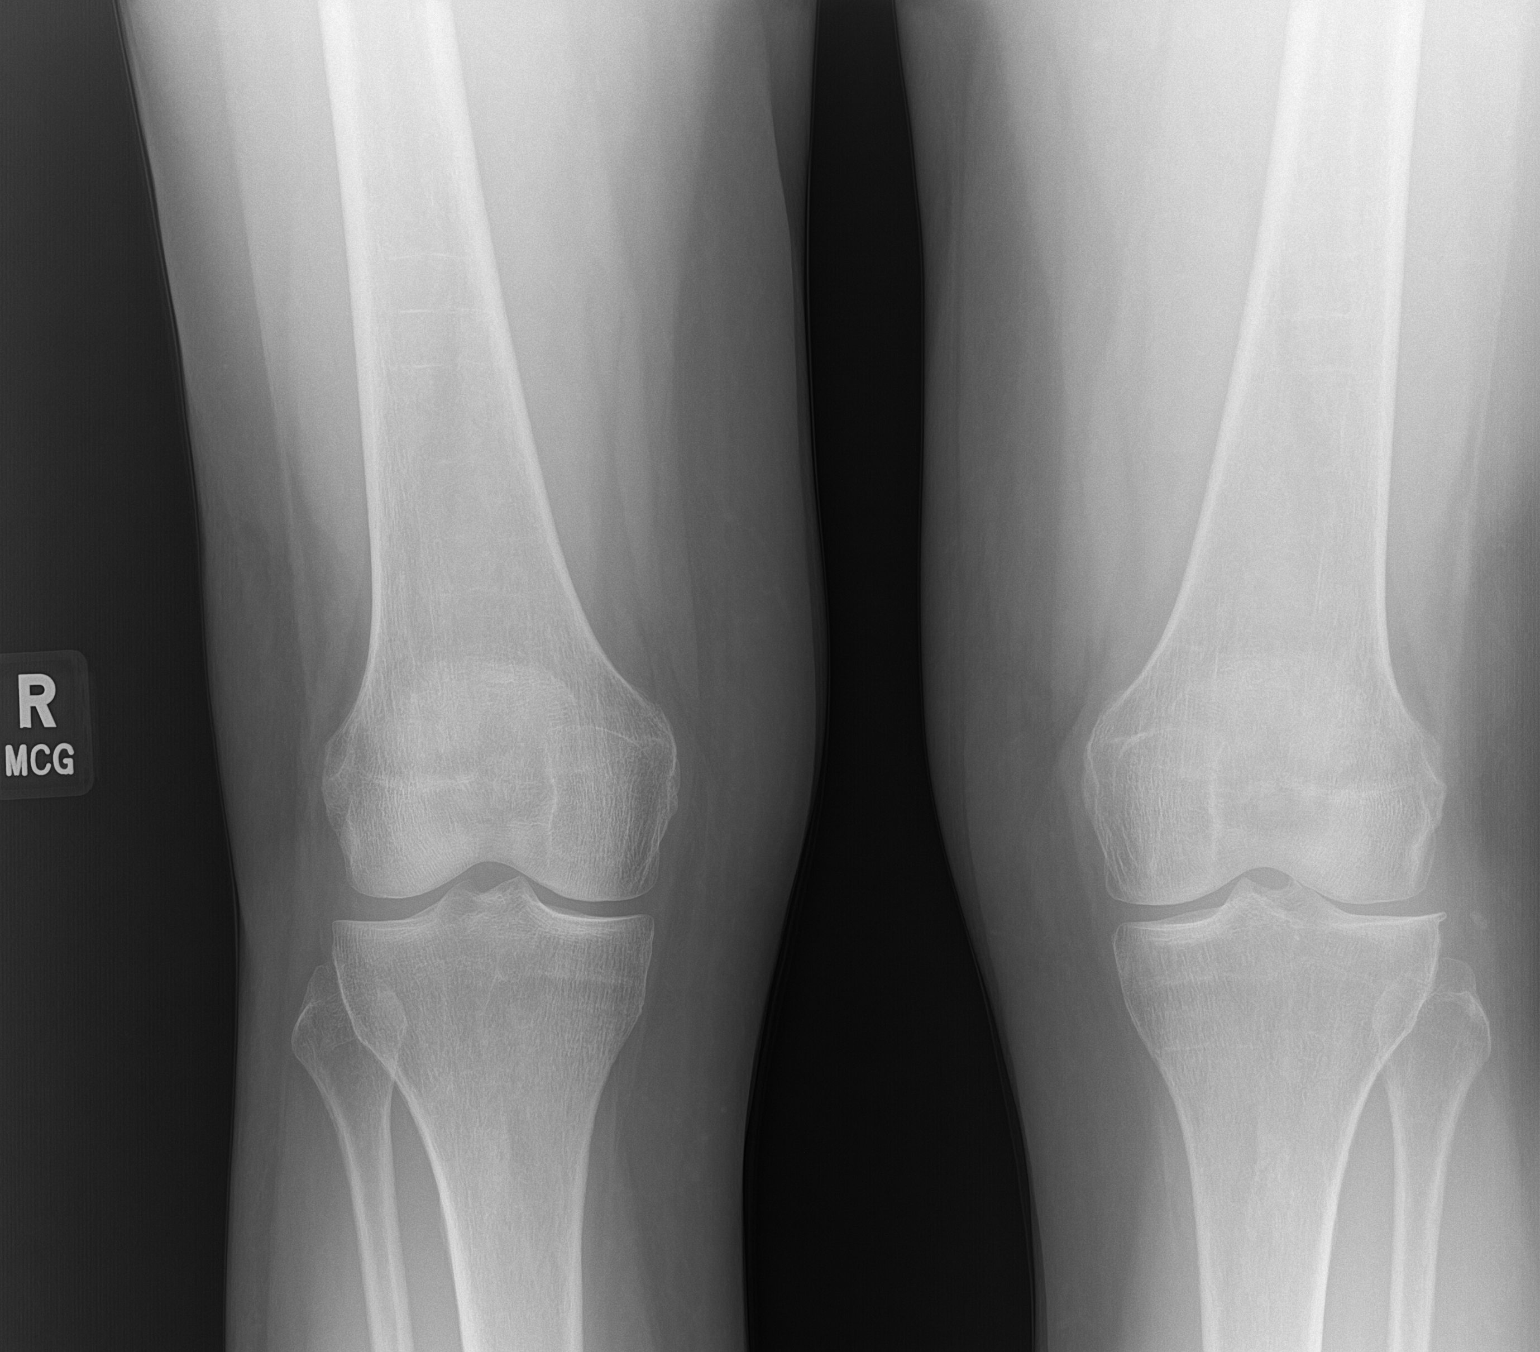

[1 of 1 positions shown; findings below may reference images not displayed]

FINDINGS: Right knee:

Mediolateral compartment joint spaces are preserved.

Left knee:

Minimal lateral compartment joint space narrowing with mild
peripheral lateral tibial plateau degenerative spurring, unchanged.
Tiny well corticated ossicle measuring up to approximately 5 mm
lateral to the proximal lateral tibia, chronic.
IMPRESSION: Mild lateral compartment of the left knee osteoarthritis.

## 2022-03-19 NOTE — Patient Instructions (Addendum)
Xray today ?PF exercises ?See me again in 2-3 months ? ?

## 2022-03-20 DIAGNOSIS — M1711 Unilateral primary osteoarthritis, right knee: Secondary | ICD-10-CM | POA: Insufficient documentation

## 2022-03-20 NOTE — Assessment & Plan Note (Signed)
Degenerative right knee but seems to be mostly in the patellofemoral, patient did not truly care for a Tru pull lite brace.  Discussed icing regimen and home exercises, which activities to do and which ones to avoid, increase activity slowly otherwise.  Follow-up with me again in 6 to 8 weeks. ?

## 2022-03-20 NOTE — Assessment & Plan Note (Signed)
Significant improvement at this time.  No significant changes needed for this problem. ?

## 2022-03-20 NOTE — Assessment & Plan Note (Signed)
Responded extremely well to the PRP. ?

## 2022-04-16 ENCOUNTER — Institutional Professional Consult (permissible substitution): Payer: Medicare PPO | Admitting: Plastic Surgery

## 2022-04-30 DIAGNOSIS — H43813 Vitreous degeneration, bilateral: Secondary | ICD-10-CM | POA: Diagnosis not present

## 2022-04-30 DIAGNOSIS — H2513 Age-related nuclear cataract, bilateral: Secondary | ICD-10-CM | POA: Diagnosis not present

## 2022-04-30 DIAGNOSIS — H04123 Dry eye syndrome of bilateral lacrimal glands: Secondary | ICD-10-CM | POA: Diagnosis not present

## 2022-04-30 DIAGNOSIS — H5213 Myopia, bilateral: Secondary | ICD-10-CM | POA: Diagnosis not present

## 2022-05-01 ENCOUNTER — Ambulatory Visit: Payer: Medicare PPO | Admitting: Plastic Surgery

## 2022-05-01 ENCOUNTER — Encounter: Payer: Self-pay | Admitting: Plastic Surgery

## 2022-05-01 VITALS — BP 133/70 | HR 89 | Ht 62.0 in | Wt 128.2 lb

## 2022-05-01 DIAGNOSIS — Z411 Encounter for cosmetic surgery: Secondary | ICD-10-CM | POA: Diagnosis not present

## 2022-05-01 DIAGNOSIS — I781 Nevus, non-neoplastic: Secondary | ICD-10-CM

## 2022-05-01 NOTE — Progress Notes (Signed)
Referring Provider [name], [name], MD [place],  [place] [address]   CC:  Chief Complaint  Patient presents with   Advice Only      Nicole [name] is an 76 y.o. female.  HPI: Patient presents to discuss spider veins in her legs.  They have been present for quite some time.  She has been to the vein center here locally and did not feel good about the process there.  She has in the past been injected with saline by dermatologist here locally who is since retired.  She felt that that treated the problem fairly well and wants to see if there is another solution similar to that.  Allergies  Allergen Reactions   Sulfonamide Derivatives Nausea And Vomiting   Latex Rash    Outpatient Encounter Medications as of 05/01/2022  Medication Sig Note   acyclovir (ZOVIRAX) 200 MG capsule Take 2 capsules by mouth 3 times daily for outbreaks 08/12/2021: As needed   acyclovir ointment (ZOVIRAX) 5 % Apply 1 application topically 3 (three) times daily as needed. 08/12/2021: As needed   amitriptyline (ELAVIL) 10 MG tablet Take 1 and 1/2 tablets (15 mg total) by mouth at bedtime.    b complex vitamins tablet Take 1 tablet by mouth once a week.    calcium carbonate (OS-CAL) 600 MG TABS Take 600 mg by mouth 3 (three) times daily with meals.    famotidine (PEPCID) 20 MG tablet Take 20 mg by mouth 2 (two) times daily.    lovastatin (MEVACOR) 20 MG tablet Take 1 tablet (20 mg total) by mouth daily.    Magnesium 300 MG CAPS Take 1 capsule by mouth daily.    VITAMIN A PO Take by mouth 3 (three) times a week. 08/12/2021: As needed, twice a week   No facility-administered encounter medications on file as of 05/01/2022.     Past Medical History:  Diagnosis Date   BRCA1 positive    Breast cancer ([place])    Left- Ductal ca SR-, Chemo ([facility]) rad rx    GERD (gastroesophageal reflux disease)    H/O migraine    HSV-2 (herpes simplex virus 2) infection    Hypercholesteremia    Rheumatoid  factor positive    OA   had rheum consult   Scoliosis    Sprain of ankle 03/06/2015   revewiwed care exercise and fu if needed     Tinnitus     Past Surgical History:  Procedure Laterality Date   BREAST LUMPECTOMY     LAPAROSCOPIC BILATERAL SALPINGO OOPHERECTOMY  2006   Dr. Joan Flores   MASTECTOMY Bilateral 2006    Family History  Problem Relation Age of Onset   Hypertension Mother    Heart disease Mother    Stroke Mother    Arthritis Mother    Heart attack Brother    Arthritis Other        maternal side RA cousin   Kidney disease Other        maternal side   Diabetes Other        1st degree relative, paternal side   Diabetes Father    Lung cancer Father     Social History   Social History Narrative   Married, household of 2. 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 Designated Party release signed 04/17/10.      Husband  prostate cancer at this time. Passed  Nov 28 2020   hh of 1  Review of Systems General: Denies fevers, chills, weight loss CV: Denies chest pain, shortness of breath, palpitations  Physical Exam    05/01/2022   10:25 AM 03/19/2022    1:15 PM 03/07/2022   12:38 PM  Vitals with BMI  Height 5' 2"  5' 1"  5' 1"   Weight 128 lbs 3 oz 128 lbs 127 lbs  BMI 23.44 64.4 03.47  Systolic 425 956   Diastolic 70 84   Pulse 89 81     General:  No acute distress,  Alert and oriented, Non-Toxic, Normal speech and affect Examination shows multiple superficial spider veins throughout her lower extremities anteriorly and posteriorly bilaterally.  No marked large varicosities of note.  Assessment/Plan Patient presents with multiple spider veins bilateral lower extremities.  We discussed trial today with the laser to see if these might be able to be improved with that.  If that successful then we will continue to treat the rest of them and see how things go.  I am having her see our PA Matt to do this trial and we will go from there.  We reviewed  the risks and benefits of laser treatment and the anticipated outcome.  All of her questions were answered  Cindra Presume 05/01/2022, 10:54 AM

## 2022-05-05 ENCOUNTER — Other Ambulatory Visit (HOSPITAL_COMMUNITY): Payer: Self-pay

## 2022-05-07 ENCOUNTER — Encounter: Payer: Self-pay | Admitting: *Deleted

## 2022-05-09 ENCOUNTER — Encounter: Payer: Self-pay | Admitting: *Deleted

## 2022-05-28 ENCOUNTER — Other Ambulatory Visit (HOSPITAL_COMMUNITY): Payer: Self-pay

## 2022-06-07 ENCOUNTER — Other Ambulatory Visit: Payer: Self-pay | Admitting: Internal Medicine

## 2022-06-07 DIAGNOSIS — G479 Sleep disorder, unspecified: Secondary | ICD-10-CM

## 2022-06-09 ENCOUNTER — Other Ambulatory Visit (HOSPITAL_COMMUNITY): Payer: Self-pay

## 2022-06-09 MED ORDER — AMITRIPTYLINE HCL 10 MG PO TABS
15.0000 mg | ORAL_TABLET | Freq: Every day | ORAL | 1 refills | Status: DC
  Filled 2022-06-09: qty 135, 90d supply, fill #0

## 2022-06-12 NOTE — Progress Notes (Signed)
Nicole [name] Phone: [phone] Subjective:   Nicole [name], am serving as a scribe for Dr. Hulan Saas.   I'm seeing this patient by the request  of:  [name], [name], MD  CC: bilateral knee pain   [id number]  03/19/2022 Degenerative right knee but seems to be mostly in the patellofemoral, patient did not truly care for a Tru pull lite brace.  Discussed icing regimen and home exercises, which activities to do and which ones to avoid, increase activity slowly otherwise.  Follow-up with me again in 6 to 8 weeks.  Responded extremely well to the PRP.  Significant improvement at this time.  [name] significant changes needed for this problem.  Updated 06/17/2022 Nicole [name] is a 76 y.o. female coming in with complaint of bilateral knee pain and R hip pain. Patient states that she is doing much better.    Patient saw primary care for and was having what seems to be musculoskeletal chest discomfort.  Patient states that she has had double mastectomy and that she notices a painful area over L side of chest as well as over xiphoid process. Painful if she presses that area with a lot of pressure.       Past Medical History:  Diagnosis Date   BRCA1 positive    Breast cancer ([place])    Left- Ductal ca SR-, Chemo ([facility]) rad rx    GERD (gastroesophageal reflux disease)    H/O migraine    HSV-2 (herpes simplex virus 2) infection    Hypercholesteremia    Rheumatoid factor positive    OA   had rheum consult   Scoliosis    Sprain of ankle 03/06/2015   revewiwed care exercise and fu if needed     Tinnitus    Past Surgical History:  Procedure Laterality Date   BREAST LUMPECTOMY     LAPAROSCOPIC BILATERAL SALPINGO OOPHERECTOMY  2006   Dr. Joan Flores   MASTECTOMY Bilateral 2006   Social History   Socioeconomic History   Marital status: Widowed    Spouse name: Not on file   Number of children: Not on file    Years of education: Not on file   Highest education level: Not on file  Occupational History   Not on file  Tobacco Use   Smoking status: Former   Smokeless tobacco: Never   Tobacco comments:    limited in college   Vaping Use   Vaping Use: Never used  Substance and Sexual Activity   Alcohol use: Yes    Alcohol/week: 7.0 standard drinks of alcohol    Types: 7 Glasses of wine per week   Drug use: [name]   Sexual activity: Not Currently    Birth control/protection: Post-menopausal  Other Topics Concern   Not on file  Social History Narrative   Married, household of 2. 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 Designated Party release signed 04/17/10.      Husband  prostate cancer at this time. Passed  Nov 28 2020   hh of 1    Social Determinants of Health   Financial Resource Strain: Low Risk  (03/07/2022)   Overall Financial Resource Strain (CARDIA)    Difficulty of Paying Living Expenses: Not hard at all  Food Insecurity: [name] Food Insecurity (03/07/2022)   Hunger Vital Sign    Worried About Running Out of Food in the Last Year: Never true    Ran  Out of Food in the Last Year: Never true  Transportation Needs: [name] Transportation Needs (03/07/2022)   PRAPARE - Hydrologist (Medical): [name]    Lack of Transportation (Non-Medical): [name]  Physical Activity: Insufficiently Active (03/07/2022)   Exercise Vital Sign    Days of Exercise per Week: 3 days    Minutes of Exercise per Session: 30 min  Stress: Stress Concern Present (03/07/2022)   [place]    Feeling of Stress : To some extent  Social Connections: Socially Isolated (03/07/2022)   Social Connection and Isolation Panel [NHANES]    Frequency of Communication with Friends and Family: More than three times a week    Frequency of Social Gatherings with Friends and Family: More than three times a week    Attends Religious  Services: Never    Marine scientist or Organizations: [name]    Attends Archivist Meetings: Never    Marital Status: Widowed   Allergies  Allergen Reactions   Sulfonamide Derivatives Nausea And Vomiting   Latex Rash   Family History  Problem Relation Age of Onset   Hypertension Mother    Heart disease Mother    Stroke Mother    Arthritis Mother    Heart attack Brother    Arthritis Other        maternal side RA cousin   Kidney disease Other        maternal side   Diabetes Other        1st degree relative, paternal side   Diabetes Father    Lung cancer Father      Current Outpatient Medications (Cardiovascular):    lovastatin (MEVACOR) 20 MG tablet, Take 1 tablet (20 mg total) by mouth daily.     Current Outpatient Medications (Other):    acyclovir (ZOVIRAX) 200 MG capsule, Take 2 capsules by mouth 3 times daily for outbreaks   acyclovir ointment (ZOVIRAX) 5 %, Apply 1 application topically 3 (three) times daily as needed.   amitriptyline (ELAVIL) 10 MG tablet, Take 1 and 1/2 tablets (15 mg total) by mouth at bedtime.   calcium carbonate (OS-CAL) 600 MG TABS, Take 600 mg by mouth 3 (three) times daily with meals.   famotidine (PEPCID) 20 MG tablet, Take 20 mg by mouth 2 (two) times daily.   Magnesium 300 MG CAPS, Take 1 capsule by mouth daily.   Reviewed prior external information including notes and imaging from  primary care provider As well as notes that were available from care everywhere and other healthcare systems.  Past medical history, social, surgical and family history all reviewed in electronic medical record.  [name] pertanent information unless stated regarding to the chief complaint.   Review of Systems:  [name] headache, visual changes, nausea, vomiting, diarrhea, constipation, dizziness, abdominal pain, skin rash, fevers, chills, night sweats, weight loss, swollen lymph nodes, body aches, joint swelling, chest pain, shortness of breath, mood  changes. POSITIVE muscle aches  Objective  Blood pressure 126/84, pulse 77, height 5' 1"  (1.549 m), weight 128 lb (58.1 kg), last menstrual period 11/24/1997, SpO2 99 %.   General: [name] apparent distress alert and oriented x3 mood and affect normal, dressed appropriately.  HEENT: Pupils equal, extraocular movements intact  Respiratory: Patient's speak in full sentences and does not appear short of breath  Cardiovascular: [name] lower extremity edema, non tender, [name] erythema  Patient does have postsurgical changes of the chest wall.  Athletic trainer was in the room.  Patient over the sixth and seventh rib does have what may be mild fullness noted.  Seems to be nontender.  The soft tissue that is overlying the area is right near the scar tissue and does seem to be movable.  [name] true masses appreciated though.  Limited muscular skeletal ultrasound was performed and interpreted by Hulan Saas, M  Limited ultrasound in the area does not show anything specific at this time.  Maybe mild increase in neovascularization of the costal cartilage between the ribs.  [name] true cortical irregularity noted of either the ribs.  [name] masses appreciated. Impression: Possible intercostal injury but [name] true tear    Impression and Recommendations:    The above documentation has been reviewed and is accurate and complete Nicole Pulley, DO

## 2022-06-14 ENCOUNTER — Other Ambulatory Visit (HOSPITAL_COMMUNITY): Payer: Self-pay

## 2022-06-15 NOTE — Progress Notes (Signed)
Chief Complaint  Patient presents with   Annual Exam    Not fasting    HPI: Patient  Nicole [name]  76 y.o. comes in today for Preventive Health Care visit and medicine evaluation To help with increased hair shedding she is switched over from single ingredient vitamins to a preparation that includes B complex is biotin AN ED and is added magnesium. She continues on her calcium vitamin D but does pop Tums a lot most daily.  Which is helpful she is trying to avoid daily medicine. Dry foods  sometimes get stuck.  Better since weight  down .  No vomiting or unintentional weight loss Scoliosis   seems to be aggravating. Has some soreness on the left chest wall she has BRCA gene concerned as possible wonders if this needs to be evaluated with ultrasound or other. Taking amitriptyline on an as-needed basis and help with sleep her headaches are much better too hard to cut the amitriptyline in half so just takes 20 mg. HLD taking lovastatin denies significant problems with this.  Health Maintenance  Topic Date Due   COLONOSCOPY (Pts 45-32yr Insurance coverage will need to be confirmed)  06/18/2021   COVID-19 Vaccine (4 - Booster for Moderna series) 12/17/2022 (Originally 09/25/2020)   INFLUENZA VACCINE  06/24/2022   TETANUS/TDAP  04/14/2024   Pneumonia Vaccine 76 Years old  Completed   DEXA SCAN  Completed   Hepatitis C Screening  Completed   Zoster Vaccines- Shingrix  Completed   HPV VACCINES  Aged Out   Health Maintenance Review LIFESTYLE:  Exercise:   active and outside projects  no sig headaches recently.  Tobacco/ETS: no Alcohol:  5-6 per week  Sugar beverages: ocass Sleep:  3-8 depending's    has stopped elavil for a while  took 2 last night with good sleep  Drug use: no    ROS: See HPI has follow-up with Dr. STamala Juliantomorrow has done really well with her hip bursitis   Past Medical History:  Diagnosis Date   BRCA1 positive    [place]ast cancer ([facility]    Left- Ductal ca  SR-, Chemo (Magrinat) rad rx    GERD (gastroesophageal reflux disease)    H/O migraine    HSV-2 (herpes simplex virus 2) infection    Hypercholesteremia    Rheumatoid factor positive    OA   had rheum consult   Scoliosis    Sprain of ankle 03/06/2015   revewiwed care exercise and fu if needed     Tinnitus     Past Surgical History:  Procedure Laterality Date   BREAST LUMPECTOMY     LAPAROSCOPIC BILATERAL SALPINGO OOPHERECTOMY  2006   Dr. RJoan Flores  MASTECTOMY Bilateral 2006    Family History  Problem Relation Age of Onset   Hypertension Mother    Heart disease Mother    Stroke Mother    Arthritis Mother    Heart attack Brother    Arthritis Other        maternal side RA cousin   Kidney disease Other        maternal side   Diabetes Other        1st degree relative, paternal side   Diabetes Father    Lung cancer Father     Social History   Socioeconomic History   Marital status: Widowed    Spouse name: Not on file   Number of children: Not on file   Years of education: Not on file  Highest education level: Not on file  Occupational History   Not on file  Tobacco Use   Smoking status: Former   Smokeless tobacco: Never   Tobacco comments:    limited in college   Vaping Use   Vaping Use: Never used  Substance and Sexual Activity   Alcohol use: Yes    Alcohol/week: 7.0 standard drinks of alcohol    Types: 7 Glasses of wine per week   Drug use: No   Sexual activity: Not Currently    Birth control/protection: Post-menopausal  Other Topics Concern   Not on file  Social History Narrative   Married, household of 2. 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 Designated Party release signed 04/17/10.      Husband  prostate cancer at this time. Passed  Nov 28 2020   hh of 1    Social Determinants of Health   Financial Resource Strain: Low Risk  (03/07/2022)   Overall Financial Resource Strain (CARDIA)    Difficulty of Paying Living Expenses: Not  hard at all  Food Insecurity: No Food Insecurity (03/07/2022)   Hunger Vital Sign    Worried About Running Out of Food in the Last Year: Never true    Ran Out of Food in the Last Year: Never true  Transportation Needs: No Transportation Needs (03/07/2022)   PRAPARE - Hydrologist (Medical): No    Lack of Transportation (Non-Medical): No  Physical Activity: Insufficiently Active (03/07/2022)   Exercise Vital Sign    Days of Exercise per Week: 3 days    Minutes of Exercise per Session: 30 min  Stress: Stress Concern Present (03/07/2022)   [place]    Feeling of Stress : To some extent  Social Connections: Socially Isolated (03/07/2022)   Social Connection and Isolation Panel [NHANES]    Frequency of Communication with Friends and Family: More than three times a week    Frequency of Social Gatherings with Friends and Family: More than three times a week    Attends Religious Services: Never    Marine scientist or Organizations: No    Attends Archivist Meetings: Never    Marital Status: Widowed    Outpatient Medications Prior to Visit  Medication Sig Dispense Refill   acyclovir (ZOVIRAX) 200 MG capsule Take 2 capsules by mouth 3 times daily for outbreaks 60 capsule 6   acyclovir ointment (ZOVIRAX) 5 % Apply 1 application topically 3 (three) times daily as needed. 30 g 6   amitriptyline (ELAVIL) 10 MG tablet Take 1 and 1/2 tablets (15 mg total) by mouth at bedtime. 135 tablet 1   calcium carbonate (OS-CAL) 600 MG TABS Take 600 mg by mouth 3 (three) times daily with meals.     famotidine (PEPCID) 20 MG tablet Take 20 mg by mouth 2 (two) times daily.     lovastatin (MEVACOR) 20 MG tablet Take 1 tablet (20 mg total) by mouth daily. 90 tablet 3   Magnesium 300 MG CAPS Take 1 capsule by mouth daily.     b complex vitamins tablet Take 1 tablet by mouth once a week. (Patient not taking:  Reported on 06/16/2022)     VITAMIN A PO Take by mouth 3 (three) times a week. (Patient not taking: Reported on 06/16/2022)     No facility-administered medications prior to visit.     EXAM:  BP 122/74 (BP Location: Left Arm,  Patient Position: Sitting, Cuff Size: Normal)   Pulse 80   Temp 98.5 F (36.9 C) (Oral)   Ht 5' 1.25" (1.556 m)   Wt 128 lb 6.4 oz (58.2 kg)   LMP 11/24/1997 (Approximate)   SpO2 99%   BMI 24.06 kg/m   Body mass index is 24.06 kg/m. Wt Readings from Last 3 Encounters:  06/16/22 128 lb 6.4 oz (58.2 kg)  05/01/22 128 lb 3.2 oz (58.2 kg)  03/19/22 128 lb (58.1 kg)    Physical Exam: Vital signs reviewed [id number] is a well-developed well-nourished alert cooperative    who appearsr stated age in no acute distress.  HEENT: normocephalic atraumatic , Eyes: PERRL EOM's full, conjunctiva clear, Nares: paten,t no deformity discharge or tenderness., Ears: no deformity EAC's clear TMs with normal landmarks. NECK: supple without masses, thyromegaly or bruits. CHEST/PULM:  Clear to auscultation and percussion breath sounds equal no wheeze , rales or rhonchi.  Asked wall some prominent costochondral junction?  On the left about T8  minimally tender no redness or effusion.  Negative bony tenderness Breast: Absent CV: PMI is nondisplaced, S1 S2 no gallops, murmurs, rubs. Peripheral pulses are full without delay.No JVD .  ABDOMEN: Bowel sounds normal nontender  No guard or rebound, no hepato splenomegal no CVA tenderness.   Extremtities:  No clubbing cyanosis or edema, no acute joint swelling or redness no focal atrophy NEURO:  Oriented x3, cranial nerves 3-12 appear to be intact, no obvious focal weakness,gait within normal limits no abnormal reflexes or asymmetrical SKIN: No acute rashes normal turgor, color, no bruising or petechiae.  No alopecia. PSYCH: Oriented, good eye contact, no obvious depression anxiety, cognition and judgment appear normal. LN: no cervical  axillary inguinal adenopathy  Lab Results  Component Value Date   WBC 4.8 06/16/2022   HGB 12.2 06/16/2022   HCT 36.9 06/16/2022   PLT 247.0 06/16/2022   GLUCOSE 88 06/16/2022   CHOL 178 06/16/2022   TRIG 102.0 06/16/2022   HDL 79.30 06/16/2022   LDLDIRECT 119.9 12/30/2012   LDLCALC 79 06/16/2022   ALT 16 06/16/2022   AST 21 06/16/2022   NA 140 06/16/2022   K 4.8 06/16/2022   CL 100 06/16/2022   CREATININE 0.85 06/16/2022   BUN 14 06/16/2022   CO2 33 (H) 06/16/2022   TSH 0.79 06/16/2022   HGBA1C 6.2 06/16/2022    BP Readings from Last 3 Encounters:  06/16/22 122/74  05/01/22 133/70  03/19/22 122/84    Lab plan reviewed with patient   ASSESSMENT AND PLAN:  Discussed the following assessment and plan:    ICD-10-CM   1. Visit for preventive health examination  Z00.00     2. Medication management  P38.250 Basic metabolic panel    CBC with Differential/Platelet    Hepatic function panel    Lipid panel    TSH    T4, free    Hemoglobin A1c    Hemoglobin A1c    T4, free    TSH    Lipid panel    Hepatic function panel    CBC with Differential/Platelet    Basic metabolic panel    3. Mixed hyperlipidemia  N39.7 Basic metabolic panel    CBC with Differential/Platelet    Hepatic function panel    Lipid panel    TSH    T4, free    Hemoglobin A1c    Hemoglobin A1c    T4, free    TSH    Lipid panel  Hepatic function panel    CBC with Differential/Platelet    Basic metabolic panel    4. BRCA1 positive  J58.72 Basic metabolic panel   B61.84 CBC with Differential/Platelet    Hepatic function panel    Lipid panel    TSH    T4, free    Hemoglobin A1c    Hemoglobin A1c    T4, free    TSH    Lipid panel    Hepatic function panel    CBC with Differential/Platelet    Basic metabolic panel    5. Chest wall discomfort  Q59.27 Basic metabolic panel    CBC with Differential/Platelet    Hepatic function panel    Lipid panel    TSH    T4, free     Hemoglobin A1c    Hemoglobin A1c    T4, free    TSH    Lipid panel    Hepatic function panel    CBC with Differential/Platelet    Basic metabolic panel    6. Heartburn  G39 Basic metabolic panel    CBC with Differential/Platelet    Hepatic function panel    Lipid panel    TSH    T4, free    Hemoglobin A1c    Hemoglobin A1c    T4, free    TSH    Lipid panel    Hepatic function panel    CBC with Differential/Platelet    Basic metabolic panel    7. Hyperlipidemia, unspecified hyperlipidemia type  E32.0 Basic metabolic panel    CBC with Differential/Platelet    Hepatic function panel    Lipid panel    TSH    T4, free    Hemoglobin A1c    Hemoglobin A1c    T4, free    TSH    Lipid panel    Hepatic function panel    CBC with Differential/Platelet    Basic metabolic panel    8. Idiopathic scoliosis and kyphoscoliosis  M41.20     I think the chest wall tenderness is probably costochondral and not serious to her health however would like Dr. Tamala Julian to assess this area since she is seeing him tomorrow.  She appears to be taking Tums very frequently for GI symptoms would like a better delineation although no other alarm symptoms we will try Pepcid twice daily for 2 weeks and let us know how doing. Can consider short-term PPI if it resolves the symptoms she wants to avoid endoscopy procedures as possible. Caution with high-dose vitamins supplements appears to be adequate on the current supplement. Can follow-up with GI symptoms are persistent. Return for depending on results and how doing  yearly .  Patient Care Team: Tailey Top, [name], MD as PCP - General Bo Merino, MD (Rheumatology) Phylliss Bob, MD as Consulting Physician (Orthopedic Surgery) Milly Jakob, MD as Consulting Physician (Orthopedic Surgery) Marygrace Drought, MD as Consulting Physician (Ophthalmology) Patient Instructions  Good to see you today . Lab today.  Try  pepcid twice a day for 2 weeks   and tums as needed.  And diet changes  .    Let   us know  how doing.  Consider  see Gi team   for opinion.  Have Dr Tamala Julian assess area that I think is CC junction  cartilage joint.       [name]. Yehya Brendle M.D.

## 2022-06-16 ENCOUNTER — Encounter: Payer: Self-pay | Admitting: Internal Medicine

## 2022-06-16 ENCOUNTER — Ambulatory Visit (INDEPENDENT_AMBULATORY_CARE_PROVIDER_SITE_OTHER): Payer: Medicare PPO | Admitting: Internal Medicine

## 2022-06-16 VITALS — BP 122/74 | HR 80 | Temp 98.5°F | Ht 61.25 in | Wt 128.4 lb

## 2022-06-16 DIAGNOSIS — Z0000 Encounter for general adult medical examination without abnormal findings: Secondary | ICD-10-CM | POA: Diagnosis not present

## 2022-06-16 DIAGNOSIS — R12 Heartburn: Secondary | ICD-10-CM

## 2022-06-16 DIAGNOSIS — E782 Mixed hyperlipidemia: Secondary | ICD-10-CM

## 2022-06-16 DIAGNOSIS — Z79899 Other long term (current) drug therapy: Secondary | ICD-10-CM | POA: Diagnosis not present

## 2022-06-16 DIAGNOSIS — Z1589 Genetic susceptibility to other disease: Secondary | ICD-10-CM

## 2022-06-16 DIAGNOSIS — R0789 Other chest pain: Secondary | ICD-10-CM

## 2022-06-16 DIAGNOSIS — Z1501 Genetic susceptibility to malignant neoplasm of breast: Secondary | ICD-10-CM

## 2022-06-16 DIAGNOSIS — E785 Hyperlipidemia, unspecified: Secondary | ICD-10-CM

## 2022-06-16 DIAGNOSIS — M4120 Other idiopathic scoliosis, site unspecified: Secondary | ICD-10-CM

## 2022-06-16 DIAGNOSIS — Z1509 Genetic susceptibility to other malignant neoplasm: Secondary | ICD-10-CM

## 2022-06-16 LAB — BASIC METABOLIC PANEL WITH GFR
BUN: 14 mg/dL (ref 6–23)
CO2: 33 meq/L — ABNORMAL HIGH (ref 19–32)
Calcium: 10.1 mg/dL (ref 8.4–10.5)
Chloride: 100 meq/L (ref 96–112)
Creatinine, Ser: 0.85 mg/dL (ref 0.40–1.20)
GFR: 67.23 mL/min
Glucose, Bld: 88 mg/dL (ref 70–99)
Potassium: 4.8 meq/L (ref 3.5–5.1)
Sodium: 140 meq/L (ref 135–145)

## 2022-06-16 LAB — TSH: TSH: 0.79 u[IU]/mL (ref 0.35–5.50)

## 2022-06-16 LAB — LIPID PANEL
Cholesterol: 178 mg/dL (ref 0–200)
HDL: 79.3 mg/dL
LDL Cholesterol: 79 mg/dL (ref 0–99)
NonHDL: 98.91
Total CHOL/HDL Ratio: 2
Triglycerides: 102 mg/dL (ref 0.0–149.0)
VLDL: 20.4 mg/dL (ref 0.0–40.0)

## 2022-06-16 LAB — CBC WITH DIFFERENTIAL/PLATELET
Basophils Absolute: 0 10*3/uL (ref 0.0–0.1)
Basophils Relative: 0.8 % (ref 0.0–3.0)
Eosinophils Absolute: 0.2 10*3/uL (ref 0.0–0.7)
Eosinophils Relative: 3.5 % (ref 0.0–5.0)
HCT: 36.9 % (ref 36.0–46.0)
Hemoglobin: 12.2 g/dL (ref 12.0–15.0)
Lymphocytes Relative: 24.1 % (ref 12.0–46.0)
Lymphs Abs: 1.1 10*3/uL (ref 0.7–4.0)
MCHC: 33 g/dL (ref 30.0–36.0)
MCV: 89.6 fl (ref 78.0–100.0)
Monocytes Absolute: 0.4 10*3/uL (ref 0.1–1.0)
Monocytes Relative: 8.2 % (ref 3.0–12.0)
Neutro Abs: 3 10*3/uL (ref 1.4–7.7)
Neutrophils Relative %: 63.4 % (ref 43.0–77.0)
Platelets: 247 10*3/uL (ref 150.0–400.0)
RBC: 4.12 Mil/uL (ref 3.87–5.11)
RDW: 13.6 % (ref 11.5–15.5)
WBC: 4.8 10*3/uL (ref 4.0–10.5)

## 2022-06-16 LAB — HEPATIC FUNCTION PANEL
ALT: 16 U/L (ref 0–35)
AST: 21 U/L (ref 0–37)
Albumin: 4.5 g/dL (ref 3.5–5.2)
Alkaline Phosphatase: 52 U/L (ref 39–117)
Bilirubin, Direct: 0.1 mg/dL (ref 0.0–0.3)
Total Bilirubin: 0.4 mg/dL (ref 0.2–1.2)
Total Protein: 6.8 g/dL (ref 6.0–8.3)

## 2022-06-16 LAB — HEMOGLOBIN A1C: Hgb A1c MFr Bld: 6.2 % (ref 4.6–6.5)

## 2022-06-16 LAB — T4, FREE: Free T4: 0.96 ng/dL (ref 0.60–1.60)

## 2022-06-16 NOTE — Patient Instructions (Addendum)
Good to see you today . Lab today.  Try  pepcid twice a day for 2 weeks  and tums as needed.  And diet changes  .    Let   us know  how doing.  Consider  see Gi team   for opinion.  Have Dr Tamala Julian assess area that I think is CC junction  cartilage joint.

## 2022-06-17 ENCOUNTER — Ambulatory Visit: Payer: Self-pay

## 2022-06-17 ENCOUNTER — Ambulatory Visit: Payer: Medicare PPO | Admitting: Family Medicine

## 2022-06-17 VITALS — BP 126/84 | HR 77 | Ht 61.0 in | Wt 128.0 lb

## 2022-06-17 DIAGNOSIS — R0789 Other chest pain: Secondary | ICD-10-CM | POA: Insufficient documentation

## 2022-06-17 NOTE — Assessment & Plan Note (Addendum)
Ultrasound the anterior chest wall and did not see any true masses appreciated.  Seems to have more of a costal cartilage likely injury that possibly would be involved.  No signs of any type of lymph nodes noted.  We did discuss with patient's past medical history significant for breast cancer and her BRCA1 gene we could consider the possibility of advanced imaging.  Patient feels better though that we did not see anything at this point and would like to not do anything at the moment.  We will do is see patient back again in 2 months total time reviewing patient's chart, looking over previous imaging including no previous mammograms 31 minutes

## 2022-06-17 NOTE — Patient Instructions (Signed)
We can do advanced imaging if things change Good luck with all the books See me in 7-8 weeks

## 2022-06-18 ENCOUNTER — Ambulatory Visit (INDEPENDENT_AMBULATORY_CARE_PROVIDER_SITE_OTHER): Payer: Self-pay | Admitting: Surgical

## 2022-06-18 DIAGNOSIS — Z411 Encounter for cosmetic surgery: Secondary | ICD-10-CM

## 2022-06-18 DIAGNOSIS — I781 Nevus, non-neoplastic: Secondary | ICD-10-CM

## 2022-06-18 NOTE — Progress Notes (Signed)
Preoperative Dx: Bilateral lower extremity leg pains  Postoperative Dx:  same  Procedure: laser to bilateral lower extremities  Anesthesia: none  Description of Procedure:  Risks and complications were explained to the patient. Consent was confirmed and signed. Time out was called and all information was confirmed to be correct. The area  area was prepped with alcohol and wiped dry.  The Nd:YAG 1064 nm laser was used to target bilateral lower extremity leg vessels.  Varying settings were used due to varying vessel sizes throughout the lower extremities.  Patient tolerated the procedure well.  She did have some tenderness throughout the procedure which was well-tolerated with providing interruptions between sessions.  She did not ever complain of significant pain.  Patient tolerated the procedure well, there were no signs of any complications postprocedural.

## 2022-06-19 NOTE — Progress Notes (Signed)
Blood results are normal .  Reassuring No diabetes   but a1c is in prediabetic range .

## 2022-07-14 ENCOUNTER — Other Ambulatory Visit: Payer: Self-pay | Admitting: Internal Medicine

## 2022-07-16 ENCOUNTER — Ambulatory Visit (INDEPENDENT_AMBULATORY_CARE_PROVIDER_SITE_OTHER): Payer: Self-pay | Admitting: Surgical

## 2022-07-16 DIAGNOSIS — Z411 Encounter for cosmetic surgery: Secondary | ICD-10-CM

## 2022-07-16 DIAGNOSIS — I781 Nevus, non-neoplastic: Secondary | ICD-10-CM

## 2022-07-16 NOTE — Progress Notes (Addendum)
Preoperative Dx: Leg vessels  Postoperative Dx:  same  Procedure: laser to bilateral leg vessels  Anesthesia: none  Description of Procedure:  Risks and complications were explained to the patient. Consent was confirmed and signed. Time out was called and all information was confirmed to be correct. The area  area was prepped with alcohol and wiped dry.  The ND YAG laser was used to laser the bilateral upper thigh and bilateral lower leg vessels using various settings for red and blue leg pains.  Patient tolerated the procedure well.  There were no complications.  We will plan to see her back as needed, she seems to have had a good result from the initial treatment but needed additional treatment today and will hopefully not need any additional treatments afterwards.   Of note, she did have some varicose veins that we initially thought would be treated with the laser, however decided that laser would not provide her any improvement in these areas and she will need to have these managed elsewhere, likely with injectables.  Due to this, we only treated about 40% of the veins that were initially discussed when providing her a quote and the quote was adjusted accordingly.

## 2022-07-17 ENCOUNTER — Other Ambulatory Visit (HOSPITAL_COMMUNITY): Payer: Self-pay

## 2022-07-17 MED ORDER — ACYCLOVIR 200 MG PO CAPS
ORAL_CAPSULE | ORAL | 6 refills | Status: DC
  Filled 2022-07-17: qty 60, 10d supply, fill #0
  Filled 2022-09-30: qty 60, 10d supply, fill #1
  Filled 2022-10-22: qty 60, 10d supply, fill #2
  Filled 2023-01-03: qty 60, 10d supply, fill #3
  Filled 2023-02-08: qty 60, 10d supply, fill #4

## 2022-08-01 ENCOUNTER — Telehealth: Payer: Self-pay | Admitting: Surgical

## 2022-08-01 NOTE — Telephone Encounter (Signed)
Patient stated you did some laser tx to her legs back on 8/23 and she would like you to call her back when you are available to discuss some things.

## 2022-08-01 NOTE — Telephone Encounter (Signed)
Called patient to check-in, reports she is doing well after her laser.  She feels as if she has noticed a good improvement in her legs, reports some of the bigger deep blue ones are still present but that she is going to go to dermatology to have these injected.  She was initially quoted 500 for the treatment, however due to [place] being unable to completely treat all of the vessels we discussed a change in price.  She has paid in full.  She reports that she received a bill in the mail or via MyChart for $500.  She cannot recall which in place exactly.  She had some questions about this and I notified her that I would have our team look into it.

## 2022-08-12 NOTE — Progress Notes (Deleted)
[place] Phone: [phone] Subjective:    I'm seeing this patient by the request  of:  [name], [name], MD  CC:   [id number]  06/17/2022 Ultrasound the anterior chest wall and did not see any true masses appreciated.  Seems to have more of a costal cartilage likely injury that possibly would be involved.  No signs of any type of lymph nodes noted.  We did discuss with patient's past medical history significant for breast cancer and her BRCA1 gene we could consider the possibility of advanced imaging.  Patient feels better though that we did not see anything at this point and would like to not do anything at the moment.  We will do is see patient back again in 2 months total time reviewing patient's chart, looking over previous imaging including no previous mammograms 31 minutes  Updated 08/13/2022 Nicole [name] is a 76 y.o. female coming in with complaint of chest pain      Past Medical History:  Diagnosis Date   BRCA1 positive    Breast cancer ([facility])    Left- Ductal ca SR-, Chemo (Magrinat) rad rx    GERD (gastroesophageal reflux disease)    H/O migraine    HSV-2 (herpes simplex virus 2) infection    Hypercholesteremia    Rheumatoid factor positive    OA   had rheum consult   Scoliosis    Sprain of ankle 03/06/2015   revewiwed care exercise and fu if needed     Tinnitus    Past Surgical History:  Procedure Laterality Date   BREAST LUMPECTOMY     LAPAROSCOPIC BILATERAL SALPINGO OOPHERECTOMY  2006   Dr. Joan Flores   MASTECTOMY Bilateral 2006   Social History   Socioeconomic History   Marital status: Widowed    Spouse name: Not on file   Number of children: Not on file   Years of education: Not on file   Highest education level: Not on file  Occupational History   Not on file  Tobacco Use   Smoking status: Former   Smokeless tobacco: Never   Tobacco comments:    limited in college    Vaping Use   Vaping Use: Never used  Substance and Sexual Activity   Alcohol use: Yes    Alcohol/week: 7.0 standard drinks of alcohol    Types: 7 Glasses of wine per week   Drug use: No   Sexual activity: Not Currently    Birth control/protection: Post-menopausal  Other Topics Concern   Not on file  Social History Narrative   Married, household of 2. 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 Designated Party release signed 04/17/10.      Husband  prostate cancer at this time. Passed  Nov 28 2020   hh of 1    Social Determinants of Health   Financial Resource Strain: Low Risk  (03/07/2022)   Overall Financial Resource Strain (CARDIA)    Difficulty of Paying Living Expenses: Not hard at all  Food Insecurity: No Food Insecurity (03/07/2022)   Hunger Vital Sign    Worried About Running Out of Food in the Last Year: Never true    Ran Out of Food in the Last Year: Never true  Transportation Needs: No Transportation Needs (03/07/2022)   PRAPARE - Hydrologist (Medical): No    Lack of Transportation (Non-Medical): No  Physical Activity: Insufficiently Active (03/07/2022)  Exercise Vital Sign    Days of Exercise per Week: 3 days    Minutes of Exercise per Session: 30 min  Stress: Stress Concern Present (03/07/2022)   [place]    Feeling of Stress : To some extent  Social Connections: Socially Isolated (03/07/2022)   Social Connection and Isolation Panel [NHANES]    Frequency of Communication with Friends and Family: More than three times a week    Frequency of Social Gatherings with Friends and Family: More than three times a week    Attends Religious Services: Never    Marine scientist or Organizations: No    Attends Archivist Meetings: Never    Marital Status: Widowed   Allergies  Allergen Reactions   Sulfonamide Derivatives Nausea And Vomiting    Latex Rash   Family History  Problem Relation Age of Onset   Hypertension Mother    Heart disease Mother    Stroke Mother    Arthritis Mother    Heart attack Brother    Arthritis Other        maternal side RA cousin   Kidney disease Other        maternal side   Diabetes Other        1st degree relative, paternal side   Diabetes Father    Lung cancer Father      Current Outpatient Medications (Cardiovascular):    lovastatin (MEVACOR) 20 MG tablet, Take 1 tablet (20 mg total) by mouth daily.     Current Outpatient Medications (Other):    acyclovir (ZOVIRAX) 200 MG capsule, Take 2 capsules by mouth 3 times daily for outbreaks   acyclovir ointment (ZOVIRAX) 5 %, Apply 1 application topically 3 (three) times daily as needed.   amitriptyline (ELAVIL) 10 MG tablet, Take 1 and 1/2 tablets (15 mg total) by mouth at bedtime.   calcium carbonate (OS-CAL) 600 MG TABS, Take 600 mg by mouth 3 (three) times daily with meals.   famotidine (PEPCID) 20 MG tablet, Take 20 mg by mouth 2 (two) times daily.   Magnesium 300 MG CAPS, Take 1 capsule by mouth daily.   Reviewed prior external information including notes and imaging from  primary care provider As well as notes that were available from care everywhere and other healthcare systems.  Past medical history, social, surgical and family history all reviewed in electronic medical record.  No pertanent information unless stated regarding to the chief complaint.   Review of Systems:  No headache, visual changes, nausea, vomiting, diarrhea, constipation, dizziness, abdominal pain, skin rash, fevers, chills, night sweats, weight loss, swollen lymph nodes, body aches, joint swelling, chest pain, shortness of breath, mood changes. POSITIVE muscle aches  Objective  Last menstrual period 11/24/1997.   General: No apparent distress alert and oriented x3 mood and affect normal, dressed appropriately.  HEENT: Pupils equal, extraocular movements  intact  Respiratory: Patient's speak in full sentences and does not appear short of breath  Cardiovascular: No lower extremity edema, non tender, no erythema      Impression and Recommendations:

## 2022-08-13 ENCOUNTER — Ambulatory Visit: Payer: Medicare PPO | Admitting: Family Medicine

## 2022-08-18 ENCOUNTER — Other Ambulatory Visit: Payer: Self-pay | Admitting: Internal Medicine

## 2022-08-20 ENCOUNTER — Other Ambulatory Visit (HOSPITAL_COMMUNITY): Payer: Self-pay

## 2022-08-20 MED ORDER — LOVASTATIN 20 MG PO TABS
20.0000 mg | ORAL_TABLET | Freq: Every day | ORAL | 3 refills | Status: DC
  Filled 2022-08-20: qty 90, 90d supply, fill #0
  Filled 2022-11-22: qty 90, 90d supply, fill #1
  Filled 2023-02-19: qty 90, 90d supply, fill #2
  Filled 2023-06-30: qty 90, 90d supply, fill #3

## 2022-08-29 ENCOUNTER — Ambulatory Visit: Payer: Self-pay

## 2022-08-29 DIAGNOSIS — I8393 Asymptomatic varicose veins of bilateral lower extremities: Secondary | ICD-10-CM

## 2022-08-29 NOTE — Progress Notes (Signed)
Nicole [name]  [address] [place] Phone: [phone] Subjective:   IVilma [name], am serving as a scribe for Dr. Hulan Saas.  I'm seeing this patient by the request  of:  [name], [name], MD  CC: Chest pain follow-up  [id number]  06/17/2022 Ultrasound the anterior chest wall and did not see any true masses appreciated.  Seems to have more of a costal cartilage likely injury that possibly would be involved.  No signs of any type of lymph nodes noted.  We did discuss with patient's past medical history significant for breast cancer and her BRCA1 gene we could consider the possibility of advanced imaging.  Patient feels better though that we did not see anything at this point and would like to not do anything at the moment.  We will do is see patient back again in 2 months total time reviewing patient's chart, looking over previous imaging including no previous mammograms 31 minutes  Updated 09/01/2022 Nicole [name] is a 76 y.o. female coming in with complaint of chest pain. Still having on and off shooting pains in one particular area of chest. Concerned because that is where she had breast cancer. Wants to tell you about visit to vein specialist. No other concerns.       Past Medical History:  Diagnosis Date   BRCA1 positive    Breast cancer ([place]    Left- Ductal ca SR-, Chemo (Magrinat) rad rx    GERD (gastroesophageal reflux disease)    H/O migraine    HSV-2 (herpes simplex virus 2) infection    Hypercholesteremia    Rheumatoid factor positive    OA   had rheum consult   Scoliosis    Sprain of ankle 03/06/2015   revewiwed care exercise and fu if needed     Tinnitus    Past Surgical History:  Procedure Laterality Date   BREAST LUMPECTOMY     LAPAROSCOPIC BILATERAL SALPINGO OOPHERECTOMY  2006   Dr. Joan Flores   MASTECTOMY Bilateral 2006   Social History   Socioeconomic History   Marital status: Widowed    Spouse  name: Not on file   Number of children: Not on file   Years of education: Not on file   Highest education level: Not on file  Occupational History   Not on file  Tobacco Use   Smoking status: Former   Smokeless tobacco: Never   Tobacco comments:    limited in college   Vaping Use   Vaping Use: Never used  Substance and Sexual Activity   Alcohol use: Yes    Alcohol/week: 7.0 standard drinks of alcohol    Types: 7 Glasses of wine per week   Drug use: No   Sexual activity: Not Currently    Birth control/protection: Post-menopausal  Other Topics Concern   Not on file  Social History Narrative   Married, household of 2. 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 Designated Party release signed 04/17/10.      Husband  prostate cancer at this time. Passed  Nov 28 2020   hh of 1    Social Determinants of Health   Financial Resource Strain: Low Risk  (03/07/2022)   Overall Financial Resource Strain (CARDIA)    Difficulty of Paying Living Expenses: Not hard at all  Food Insecurity: No Food Insecurity (03/07/2022)   Hunger Vital Sign    Worried About Running Out of Food in the Last Year: Never  true    Ran Out of Food in the Last Year: Never true  Transportation Needs: No Transportation Needs (03/07/2022)   PRAPARE - Hydrologist (Medical): No    Lack of Transportation (Non-Medical): No  Physical Activity: Insufficiently Active (03/07/2022)   Exercise Vital Sign    Days of Exercise per Week: 3 days    Minutes of Exercise per Session: 30 min  Stress: Stress Concern Present (03/07/2022)   [place]    Feeling of Stress : To some extent  Social Connections: Socially Isolated (03/07/2022)   Social Connection and Isolation Panel [NHANES]    Frequency of Communication with Friends and Family: More than three times a week    Frequency of Social Gatherings with Friends and Family:  More than three times a week    Attends Religious Services: Never    Marine scientist or Organizations: No    Attends Archivist Meetings: Never    Marital Status: Widowed   Allergies  Allergen Reactions   Sulfonamide Derivatives Nausea And Vomiting   Latex Rash   Family History  Problem Relation Age of Onset   Hypertension Mother    Heart disease Mother    Stroke Mother    Arthritis Mother    Heart attack Brother    Arthritis Other        maternal side RA cousin   Kidney disease Other        maternal side   Diabetes Other        1st degree relative, paternal side   Diabetes Father    Lung cancer Father      Current Outpatient Medications (Cardiovascular):    lovastatin (MEVACOR) 20 MG tablet, Take 1 tablet (20 mg total) by mouth daily.     Current Outpatient Medications (Other):    acyclovir (ZOVIRAX) 200 MG capsule, Take 2 capsules by mouth 3 times daily for outbreaks   acyclovir ointment (ZOVIRAX) 5 %, Apply 1 application topically 3 (three) times daily as needed.   amitriptyline (ELAVIL) 10 MG tablet, Take 1 and 1/2 tablets (15 mg total) by mouth at bedtime.   calcium carbonate (OS-CAL) 600 MG TABS, Take 600 mg by mouth 3 (three) times daily with meals.   famotidine (PEPCID) 20 MG tablet, Take 20 mg by mouth 2 (two) times daily.   Magnesium 300 MG CAPS, Take 1 capsule by mouth daily.   Reviewed prior external information including notes and imaging from  primary care provider As well as notes that were available from care everywhere and other healthcare systems.  Past medical history, social, surgical and family history all reviewed in electronic medical record.  No pertanent information unless stated regarding to the chief complaint.   Review of Systems:  No headache, visual changes, nausea, vomiting, diarrhea, constipation, dizziness, abdominal pain, skin rash, fevers, chills, night sweats, swollen lymph nodes, body aches, joint swelling, chest  pain, shortness of breath, mood changes. POSITIVE muscle aches, weight loss  Objective  Pulse 84, height 5' 1"  (1.549 m), weight 125 lb (56.7 kg), last menstrual period 11/24/1997, SpO2 97 %.   General: No apparent distress alert and oriented x3 mood and affect normal, dressed appropriately.  HEENT: Pupils equal, extraocular movements intact  Respiratory: Patient's speak in full sentences and does not appear short of breath  Cardiovascular: No lower extremity edema, non tender, no erythema  Patient does have significant scoliosis noted.  Patient  does have tenderness to palpation in the chest wall.  Seems to be actually between the muscles of the rib cage.  Seems to be mostly around the nipple line anymore anterior.  Some mild breast tissue still remaining in the upper lateral aspect.  Tender mildly on exam but no true masses noted.  Shoulder has relatively good range of motion.    Impression and Recommendations:    The above documentation has been reviewed and is accurate and complete Lyndal Pulley, DO

## 2022-08-29 NOTE — Progress Notes (Signed)
Treated pt's BLE spider and small reticular veins with Asclera 1% administered with a 27g butterfly.  Patient received a total of 4 mL. Pt tolerated well. Easy access. APP came in to assess pt prior to treatment. Pt was fitted for compression hose 20-79mHg in size medium. Pt was given post treatment care instructions on both handout and verbally. She will call [place] she has any questions/concerns.     Photos: Yes.    Compression stockings applied: Yes.

## 2022-09-01 ENCOUNTER — Ambulatory Visit (INDEPENDENT_AMBULATORY_CARE_PROVIDER_SITE_OTHER): Payer: Medicare PPO

## 2022-09-01 ENCOUNTER — Ambulatory Visit: Payer: Medicare PPO | Admitting: Family Medicine

## 2022-09-01 VITALS — HR 84 | Ht 61.0 in | Wt 125.0 lb

## 2022-09-01 DIAGNOSIS — R0789 Other chest pain: Secondary | ICD-10-CM

## 2022-09-01 DIAGNOSIS — C50919 Malignant neoplasm of unspecified site of unspecified female breast: Secondary | ICD-10-CM | POA: Diagnosis not present

## 2022-09-01 DIAGNOSIS — R079 Chest pain, unspecified: Secondary | ICD-10-CM | POA: Diagnosis not present

## 2022-09-01 NOTE — Patient Instructions (Addendum)
Xray today [facility] [phone] Call Today  When we receive your results we will contact you.  

## 2022-09-01 NOTE — Assessment & Plan Note (Signed)
Patient states that possibly even over the course of the last 2 months things have actually worsened.  Patient states that now she would like for potential advanced imaging.  I think that this is highly warranted with patient having weight loss recently as well.  Will get chest x-ray to rule out any lung masses but I do feel an MRI of chest with contrast would be beneficial.  Have previously seen reoccurrence inferior to the muscle.  Depending on the findings we will discuss with patient and about different treatment and management.  Patient is in agreement with this.  Did discuss medications which patient wants to continue with over-the-counter at the moment.  Total time with patient 33 minutes

## 2022-09-02 ENCOUNTER — Encounter: Payer: Self-pay | Admitting: Family Medicine

## 2022-09-12 ENCOUNTER — Ambulatory Visit: Payer: Medicare PPO | Admitting: Family Medicine

## 2022-09-12 ENCOUNTER — Ambulatory Visit (INDEPENDENT_AMBULATORY_CARE_PROVIDER_SITE_OTHER): Payer: Medicare PPO

## 2022-09-12 ENCOUNTER — Encounter: Payer: Self-pay | Admitting: Family Medicine

## 2022-09-12 ENCOUNTER — Ambulatory Visit: Payer: Self-pay

## 2022-09-12 VITALS — BP 134/80 | HR 79 | Ht 61.0 in

## 2022-09-12 DIAGNOSIS — S92353A Displaced fracture of fifth metatarsal bone, unspecified foot, initial encounter for closed fracture: Secondary | ICD-10-CM | POA: Insufficient documentation

## 2022-09-12 DIAGNOSIS — M79671 Pain in right foot: Secondary | ICD-10-CM

## 2022-09-12 DIAGNOSIS — S92354A Nondisplaced fracture of fifth metatarsal bone, right foot, initial encounter for closed fracture: Secondary | ICD-10-CM | POA: Diagnosis not present

## 2022-09-12 NOTE — Patient Instructions (Signed)
Vit D 4-5000iu daily Wear the boot when walking, don't drive in it '500mg'$  Daily Vit C When sitting around come out of boot See you again in 3-4 weeks

## 2022-09-12 NOTE — Progress Notes (Signed)
Nicole [name] Phone: [phone] Subjective:    I'm seeing this patient by the request  of:  [name], [name], MD  CC: foot pain   [id number]  09/01/2022 Patient states that possibly even over the course of the last 2 months things have actually worsened.  Patient states that now she would like for potential advanced imaging.  I think that this is highly warranted with patient having weight loss recently as well.  Will get chest x-ray to rule out any lung masses but I do feel an MRI of chest with contrast would be beneficial.  Have previously seen reoccurrence inferior to the muscle.  Depending on the findings we will discuss with patient and about different treatment and management.  Patient is in agreement with this.  Did discuss medications which patient wants to continue with over-the-counter at the moment.  Total time with patient 33 minutes  Updated 09/12/2022 Nicole [name] is a 76 y.o. female coming in with complaint of foot pain. Missed a step and injured her right foot. Visual bruising on the dorsal side. Tender to touch and palpation on the lateral side. Has had previous break to the 5th before. Currently on thong sandals, sharp pain when walking in any other shoe.     Past Medical History:  Diagnosis Date   BRCA1 positive    Breast cancer ([place])    Left- Ductal ca SR-, Chemo (Magrinat) rad rx    GERD (gastroesophageal reflux disease)    H/O migraine    HSV-2 (herpes simplex virus 2) infection    Hypercholesteremia    Rheumatoid factor positive    OA   had rheum consult   Scoliosis    Sprain of ankle 03/06/2015   revewiwed care exercise and fu if needed     Tinnitus    Past Surgical History:  Procedure Laterality Date   BREAST LUMPECTOMY     LAPAROSCOPIC BILATERAL SALPINGO OOPHERECTOMY  2006   Dr. Joan Flores   MASTECTOMY Bilateral 2006   Social History   Socioeconomic History   Marital status:  Widowed    Spouse name: Not on file   Number of children: Not on file   Years of education: Not on file   Highest education level: Not on file  Occupational History   Not on file  Tobacco Use   Smoking status: Former   Smokeless tobacco: Never   Tobacco comments:    limited in college   Vaping Use   Vaping Use: Never used  Substance and Sexual Activity   Alcohol use: Yes    Alcohol/week: 7.0 standard drinks of alcohol    Types: 7 Glasses of wine per week   Drug use: No   Sexual activity: Not Currently    Birth control/protection: Post-menopausal  Other Topics Concern   Not on file  Social History Narrative   Married, household of 2. 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 Designated Party release signed 04/17/10.      Husband  prostate cancer at this time. Passed  Nov 28 2020   hh of 1    Social Determinants of Health   Financial Resource Strain: Low Risk  (03/07/2022)   Overall Financial Resource Strain (CARDIA)    Difficulty of Paying Living Expenses: Not hard at all  Food Insecurity: No Food Insecurity (03/07/2022)   Hunger Vital Sign    Worried About Running Out of Food in the Last  Year: Never true    [place] in the Last Year: Never true  Transportation Needs: No Transportation Needs (03/07/2022)   PRAPARE - Hydrologist (Medical): No    Lack of Transportation (Non-Medical): No  Physical Activity: Insufficiently Active (03/07/2022)   Exercise Vital Sign    Days of Exercise per Week: 3 days    Minutes of Exercise per Session: 30 min  Stress: Stress Concern Present (03/07/2022)   [place]    Feeling of Stress : To some extent  Social Connections: Socially Isolated (03/07/2022)   Social Connection and Isolation Panel [NHANES]    Frequency of Communication with Friends and Family: More than three times a week    Frequency of Social Gatherings with  Friends and Family: More than three times a week    Attends Religious Services: Never    Marine scientist or Organizations: No    Attends Archivist Meetings: Never    Marital Status: Widowed   Allergies  Allergen Reactions   Sulfonamide Derivatives Nausea And Vomiting   Latex Rash   Family History  Problem Relation Age of Onset   Hypertension Mother    Heart disease Mother    Stroke Mother    Arthritis Mother    Heart attack Brother    Arthritis Other        maternal side RA cousin   Kidney disease Other        maternal side   Diabetes Other        1st degree relative, paternal side   Diabetes Father    Lung cancer Father      Current Outpatient Medications (Cardiovascular):    lovastatin (MEVACOR) 20 MG tablet, Take 1 tablet (20 mg total) by mouth daily.     Current Outpatient Medications (Other):    acyclovir (ZOVIRAX) 200 MG capsule, Take 2 capsules by mouth 3 times daily for outbreaks   acyclovir ointment (ZOVIRAX) 5 %, Apply 1 application topically 3 (three) times daily as needed.   amitriptyline (ELAVIL) 10 MG tablet, Take 1 and 1/2 tablets (15 mg total) by mouth at bedtime.   calcium carbonate (OS-CAL) 600 MG TABS, Take 600 mg by mouth 3 (three) times daily with meals.   famotidine (PEPCID) 20 MG tablet, Take 20 mg by mouth 2 (two) times daily.   Magnesium 300 MG CAPS, Take 1 capsule by mouth daily.   Reviewed prior external information including notes and imaging from  primary care provider As well as notes that were available from care everywhere and other healthcare systems.  Past medical history, social, surgical and family history all reviewed in electronic medical record.  No pertanent information unless stated regarding to the chief complaint.   Review of Systems:  No headache, visual changes, nausea, vomiting, diarrhea, constipation, dizziness, abdominal pain, skin rash, fevers, chills, night sweats, weight loss, swollen lymph nodes,  body aches, joint swelling, chest pain, shortness of breath, mood changes. POSITIVE muscle aches  Objective  Blood pressure 134/80, pulse 79, height _0  (1.549 m), last menstrual period 11/24/1997, SpO2 94 %.   General: No apparent distress alert and oriented x3 mood and affect normal, dressed appropriately.  HEENT: Pupils equal, extraocular movements intact  Respiratory: Patient's speak in full sentences and does not appear short of breath  Cardiovascular: No lower extremity edema, non tender, no erythema  Right foot does have bruising over the foot.  Severely tender over the fourth and fifth metatarsals patient does have good range of motion of the ankle noted.  Severely antalgic gait noted favoring the right foot.  Limited muscular skeletal ultrasound was performed and interpreted by Hulan Saas, M  Ultrasound shows there is a cortical irregularity in the fifth metatarsal shaft.  Seems to be lateral.  Patient also has a abnormality noted but appears to be almost the longitudinal fracture of the fourth metatarsal.  Significant soft tissue swelling in the area. Impression: Fifth metatarsal fracture    Impression and Recommendations:    The above documentation has been reviewed and is accurate and complete Lyndal Pulley, DO

## 2022-09-12 NOTE — Assessment & Plan Note (Signed)
Fracture of the fifth metatarsal noted.  Discussed icing regimen and home exercises, which activities to do which ones to avoid.  CAM Walker given.  Discussed vitamin D supplementation.  Follow-up again in 3 to 4 weeks for repeat x-rays.

## 2022-09-17 ENCOUNTER — Encounter: Payer: Self-pay | Admitting: Family Medicine

## 2022-09-21 ENCOUNTER — Ambulatory Visit
Admission: RE | Admit: 2022-09-21 | Discharge: 2022-09-21 | Disposition: A | Discharge status: Home / Self Care | Payer: Medicare PPO | Source: Ambulatory Visit | Attending: Family Medicine | Admitting: Family Medicine

## 2022-09-21 DIAGNOSIS — R0789 Other chest pain: Secondary | ICD-10-CM | POA: Diagnosis not present

## 2022-09-21 MED ORDER — GADOPICLENOL 0.5 MMOL/ML IV SOLN
6.0000 mL | Freq: Once | INTRAVENOUS | Status: AC | PRN
  Administered 2022-09-21: 6 mL via INTRAVENOUS

## 2022-09-30 ENCOUNTER — Other Ambulatory Visit (HOSPITAL_COMMUNITY): Payer: Self-pay

## 2022-10-07 NOTE — Progress Notes (Unsigned)
Nicole Nicole [name] Nicole Nicole [name] Phone: [phone] Subjective:   Nicole Nicole [name], am serving as a scribe for Nicole Nicole [name].  I'm seeing this patient by the request  of:  Nicole Nicole [name]  CC: right foot pain   PNT:[id number]  09/12/2022 Fracture of the fifth metatarsal noted.  Discussed icing regimen and home exercises, which activities to do which ones to avoid.  CAM Walker given.  Discussed vitamin D supplementation.  Follow-up again in 3 to 4 weeks for repeat x-rays   Update 10/08/2022 Nicole Nicole [name] is a 76 y.o. female coming in with complaint of R foot pain. Patient states that she is feeling improvement. Develops pain with a lot of weight bearing. Has walked to the bathroom without the boot and this was not painful. Did slip on a ramp since last visit while wearing the boot and slammed boot onto ramp. Had soreness for one day.        Past Medical History:  Diagnosis Date   BRCA1 positive    Breast cancer ([place])    Left- Ductal ca SR-, Chemo (Magrinat) rad rx    GERD (gastroesophageal reflux disease)    H/O migraine    HSV-2 (herpes simplex virus 2) infection    Hypercholesteremia    Rheumatoid factor positive    OA   had rheum consult   Scoliosis    Sprain of ankle 03/06/2015   revewiwed care exercise and fu if needed     Tinnitus    Past Surgical History:  Procedure Laterality Date   BREAST LUMPECTOMY     LAPAROSCOPIC BILATERAL SALPINGO OOPHERECTOMY  2006   Dr. Joan Nicole [name]   MASTECTOMY Bilateral 2006   Social History   Socioeconomic History   Marital status: Widowed    Spouse name: Not on file   Number of children: Not on file   Years of education: Not on file   Highest education level: Not on file  Occupational History   Not on file  Tobacco Use   Smoking status: Former   Smokeless tobacco: Never   Tobacco comments:    limited in college   Vaping Use   Vaping Use: Never used  Substance and  Sexual Activity   Alcohol use: Yes    Alcohol/week: 7.0 standard drinks of alcohol    Types: 7 Glasses of wine per week   Drug use: Nicole [name]   Sexual activity: Not Currently    Birth control/protection: Post-menopausal  Other Topics Concern   Not on file  Social History Narrative   Married, household of 2. 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 Designated Party release signed 04/17/10.      Husband  prostate cancer at this time. Passed  Nov 28 2020   hh of 1    Social Determinants of Health   Financial Resource Strain: Low Risk  (03/07/2022)   Overall Financial Resource Strain (CARDIA)    Difficulty of Paying Living Expenses: Not hard at all  Food Insecurity: Nicole [name] Food Insecurity (03/07/2022)   Hunger Vital Sign    Worried About Running Out of Food in the Last Year: Never true    Ran Out of Food in the Last Year: Never true  Transportation Needs: Nicole [name] Transportation Needs (03/07/2022)   PRAPARE - Hydrologist (Medical): Nicole [name]    Lack of Transportation (Non-Medical): Nicole [name]  Physical Activity: Insufficiently Active (03/07/2022)   Exercise Vital Sign  Days of Exercise per Week: 3 days    Minutes of Exercise per Session: 30 min  Stress: Stress Concern Present (03/07/2022)   Nicole Nicole [name]    Feeling of Stress : To some extent  Social Connections: Socially Isolated (03/07/2022)   Social Connection and Isolation Panel [NHANES]    Frequency of Communication with Friends and Family: More than three times a week    Frequency of Social Gatherings with Friends and Family: More than three times a week    Attends Religious Services: Never    Marine scientist or Organizations: Nicole [name]    Attends Archivist Meetings: Never    Marital Status: Widowed   Allergies  Allergen Reactions   Sulfonamide Derivatives Nausea And Vomiting   Latex Rash   Family History  Problem Relation Age of  Onset   Hypertension Mother    Heart disease Mother    Stroke Mother    Arthritis Mother    Heart attack Brother    Arthritis Other        maternal side RA cousin   Kidney disease Other        maternal side   Diabetes Other        1st degree relative, paternal side   Diabetes Father    Lung cancer Father      Current Outpatient Medications (Cardiovascular):    lovastatin (MEVACOR) 20 MG tablet, Take 1 tablet (20 mg total) by mouth daily.     Current Outpatient Medications (Other):    acyclovir (ZOVIRAX) 200 MG capsule, Take 2 capsules by mouth 3 times daily for outbreaks   acyclovir ointment (ZOVIRAX) 5 %, Apply 1 application topically 3 (three) times daily as needed.   calcium carbonate (OS-CAL) 600 MG TABS, Take 600 mg by mouth 3 (three) times daily with meals.   famotidine (PEPCID) 20 MG tablet, Take 20 mg by mouth 2 (two) times daily.   Magnesium 300 MG CAPS, Take 1 capsule by mouth daily.   amitriptyline (ELAVIL) 10 MG tablet, Take 1 and 1/2 tablets (15 mg total) by mouth at bedtime.   Reviewed prior external information including notes and imaging from  primary care provider As well as notes that were available from care everywhere and other healthcare systems.  Past medical history, social, surgical and family history all reviewed in electronic medical record.  Nicole [name] pertanent information unless stated regarding to the chief complaint.   Review of Systems:  Nicole [name] headache, visual changes, nausea, vomiting, diarrhea, constipation, dizziness, abdominal pain, skin rash, fevers, chills, night sweats, weight loss, swollen lymph nodes, body aches, joint swelling, chest pain, shortness of breath, mood changes. POSITIVE muscle aches  Objective  Blood pressure 124/84, pulse 72, height _0  (1.549 m), weight 131 lb (59.4 kg), last menstrual period 11/24/1997, SpO2 99 %.   General: Nicole [name] apparent distress alert and oriented x3 mood and affect normal, dressed appropriately.  HEENT:  Pupils equal, extraocular movements intact  Respiratory: Patient's speak in full sentences and does not appear short of breath  Cardiovascular: Nicole [name] lower extremity edema, non tender, Nicole [name] erythema  Antalgic gait noted.  Patient does have tenderness over the fifth metatarsal.  Significant improvement though in the swelling that was noted previously.  Good range of motion of the ankle.  Limited muscular skeletal ultrasound was performed and interpreted by Hulan [name], M  Limited ultrasound shows that patient has maybe a mild soft callus formation of the shaft of  the fifth metatarsal.  This is extra-articular.  Patient though still seems to be behind on the true callus formation in the area. Impression: Delayed healing of the fifth metatarsal fracture.    Impression and Recommendations:     The above documentation has been reviewed and is accurate and complete Lyndal Pulley, DO

## 2022-10-08 ENCOUNTER — Ambulatory Visit: Payer: Medicare PPO | Admitting: Family Medicine

## 2022-10-08 ENCOUNTER — Ambulatory Visit (INDEPENDENT_AMBULATORY_CARE_PROVIDER_SITE_OTHER): Payer: Medicare PPO

## 2022-10-08 ENCOUNTER — Encounter: Payer: Self-pay | Admitting: Family Medicine

## 2022-10-08 ENCOUNTER — Ambulatory Visit: Payer: Self-pay

## 2022-10-08 VITALS — BP 124/84 | HR 72 | Ht 61.0 in | Wt 131.0 lb

## 2022-10-08 DIAGNOSIS — S92354G Nondisplaced fracture of fifth metatarsal bone, right foot, subsequent encounter for fracture with delayed healing: Secondary | ICD-10-CM

## 2022-10-08 DIAGNOSIS — M79671 Pain in right foot: Secondary | ICD-10-CM

## 2022-10-08 NOTE — Patient Instructions (Addendum)
Good to see you  Boot with walking for 2 weeks Ok to use shoe in house after 2 weeks Follow up in 4 weeks

## 2022-10-08 NOTE — Assessment & Plan Note (Signed)
Patient does have the fracture of the shaft of the fifth metatarsal as well as likely before.  Discussed with patient that we are still having difficulty and has a lot of soft callus formation.  We will get x-rays to further evaluate to see if patient continues to heal.  Significant decrease in the swelling at the moment so hopefully will do well but does seem to be a little bit behind schedule at the moment.  Follow-up with me again in 4weeks.  Continue the boot for 2 weeks and then try like she was in the hospital.  Week

## 2022-10-22 ENCOUNTER — Other Ambulatory Visit (HOSPITAL_COMMUNITY): Payer: Self-pay

## 2022-10-30 ENCOUNTER — Other Ambulatory Visit (HOSPITAL_COMMUNITY): Payer: Self-pay

## 2022-11-05 NOTE — Progress Notes (Signed)
[name] [place] Phone: [phone] Subjective:   Nicole [name], am serving as a scribe for Dr. Hulan Saas.  I'm seeing this patient by the request  of:  [name], [name], MD  CC: Foot pain follow-up  [id number]  10/08/2022 Patient does have the fracture of the shaft of the fifth metatarsal as well as likely before.  Discussed with patient that we are still having difficulty and has a lot of soft callus formation.  We will get x-rays to further evaluate to see if patient continues to heal.  Significant decrease in the swelling at the moment so hopefully will do well but does seem to be a little bit behind schedule at the moment.  Follow-up with me again in 4weeks.  Continue the boot for 2 weeks and then try like she was in the hospital.  Week    Update 11/07/2022 Nicole [name] is a 76 y.o. female coming in with complaint of R foot pain. Patient states that she been doing pretty well, but with a wrong shoe that she will wear around the hose she will feel some pain on the lateral foot where she had a previous break. States she can mover her foot all around without any issues and is hoping it is healed or almost healed        Past Medical History:  Diagnosis Date   BRCA1 positive    Breast cancer ([place])    Left- Ductal ca SR-, Chemo ([facility]) rad rx    GERD (gastroesophageal reflux disease)    H/O migraine    HSV-2 (herpes simplex virus 2) infection    Hypercholesteremia    Rheumatoid factor positive    OA   had rheum consult   Scoliosis    Sprain of ankle 03/06/2015   revewiwed care exercise and fu if needed     Tinnitus    Past Surgical History:  Procedure Laterality Date   BREAST LUMPECTOMY     LAPAROSCOPIC BILATERAL SALPINGO OOPHERECTOMY  2006   Dr. Joan Flores   MASTECTOMY Bilateral 2006   Social History   Socioeconomic History   Marital status: Widowed    Spouse name: Not on file   Number of  children: Not on file   Years of education: Not on file   Highest education level: Not on file  Occupational History   Not on file  Tobacco Use   Smoking status: Former   Smokeless tobacco: Never   Tobacco comments:    limited in college   Vaping Use   Vaping Use: Never used  Substance and Sexual Activity   Alcohol use: Yes    Alcohol/week: 7.0 standard drinks of alcohol    Types: 7 Glasses of wine per week   Drug use: No   Sexual activity: Not Currently    Birth control/protection: Post-menopausal  Other Topics Concern   Not on file  Social History Narrative   Married, household of 2. 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 Designated Party release signed 04/17/10.      Husband  prostate cancer at this time. Passed  Nov 28 2020   hh of 1    Social Determinants of Health   Financial Resource Strain: Low Risk  (03/07/2022)   Overall Financial Resource Strain (CARDIA)    Difficulty of Paying Living Expenses: Not hard at all  Food Insecurity: No Food Insecurity (03/07/2022)   Hunger Vital Sign  Worried About Charity fundraiser in the Last Year: Never true    [place] in the Last Year: Never true  Transportation Needs: No Transportation Needs (03/07/2022)   PRAPARE - Hydrologist (Medical): No    Lack of Transportation (Non-Medical): No  Physical Activity: Insufficiently Active (03/07/2022)   Exercise Vital Sign    Days of Exercise per Week: 3 days    Minutes of Exercise per Session: 30 min  Stress: Stress Concern Present (03/07/2022)   [place]    Feeling of Stress : To some extent  Social Connections: Socially Isolated (03/07/2022)   Social Connection and Isolation Panel [NHANES]    Frequency of Communication with Friends and Family: More than three times a week    Frequency of Social Gatherings with Friends and Family: More than three times a week     Attends Religious Services: Never    Marine scientist or Organizations: No    Attends Archivist Meetings: Never    Marital Status: Widowed   Allergies  Allergen Reactions   Sulfonamide Derivatives Nausea And Vomiting   Latex Rash   Family History  Problem Relation Age of Onset   Hypertension Mother    Heart disease Mother    Stroke Mother    Arthritis Mother    Heart attack Brother    Arthritis Other        maternal side RA cousin   Kidney disease Other        maternal side   Diabetes Other        1st degree relative, paternal side   Diabetes Father    Lung cancer Father      Current Outpatient Medications (Cardiovascular):    lovastatin (MEVACOR) 20 MG tablet, Take 1 tablet (20 mg total) by mouth daily.     Current Outpatient Medications (Other):    acyclovir (ZOVIRAX) 200 MG capsule, Take 2 capsules by mouth 3 times daily for outbreaks   acyclovir ointment (ZOVIRAX) 5 %, Apply 1 application topically 3 (three) times daily as needed.   calcium carbonate (OS-CAL) 600 MG TABS, Take 600 mg by mouth 3 (three) times daily with meals.   famotidine (PEPCID) 20 MG tablet, Take 20 mg by mouth 2 (two) times daily.   Magnesium 300 MG CAPS, Take 1 capsule by mouth daily.   Reviewed prior external information including notes and imaging from  primary care provider As well as notes that were available from care everywhere and other healthcare systems.  Past medical history, social, surgical and family history all reviewed in electronic medical record.  No pertanent information unless stated regarding to the chief complaint.   Review of Systems:  No headache, visual changes, nausea, vomiting, diarrhea, constipation, dizziness, abdominal pain, skin rash, fevers, chills, night sweats, weight loss, swollen lymph nodes, body aches, joint swelling, chest pain, shortness of breath, mood changes. POSITIVE muscle aches  Objective  Blood pressure 110/80, pulse 78, height  5' 1" (1.549 m), last menstrual period 11/24/1997, SpO2 95 %.   General: No apparent distress alert and oriented x3 mood and affect normal, dressed appropriately.  HEENT: Pupils equal, extraocular movements intact  Respiratory: Patient's speak in full sentences and does not appear short of breath  Cardiovascular: No lower extremity edema, non tender, no erythema  Patient's right foot and does not have any swelling noted.  Still has an antalgic gait noted.  Tender to palpation over the fifth metatarsal.    Impression and Recommendations:     The above documentation has been reviewed and is accurate and complete Lyndal Pulley, DO

## 2022-11-07 ENCOUNTER — Ambulatory Visit (INDEPENDENT_AMBULATORY_CARE_PROVIDER_SITE_OTHER): Payer: Medicare PPO

## 2022-11-07 ENCOUNTER — Ambulatory Visit: Payer: Medicare PPO | Admitting: Family Medicine

## 2022-11-07 VITALS — BP 110/80 | HR 78 | Ht 61.0 in

## 2022-11-07 DIAGNOSIS — S92354G Nondisplaced fracture of fifth metatarsal bone, right foot, subsequent encounter for fracture with delayed healing: Secondary | ICD-10-CM | POA: Diagnosis not present

## 2022-11-07 DIAGNOSIS — M79671 Pain in right foot: Secondary | ICD-10-CM | POA: Diagnosis not present

## 2022-11-07 NOTE — Assessment & Plan Note (Signed)
Continues to show some healing but it is slow process.  Likely underlying problems have been contributing.  Continue the cam walker for now.  Discussed icing regimen and home exercises.  Which activities to do limitations to avoid.  Patient will avoid walking barefoot.  Continue vitamin D supplementation.  The patient still shows delayed healing and she will consider bone stimulator.  Follow-up again in 1 month

## 2022-11-07 NOTE — Patient Instructions (Signed)
Good to see you  Okay to work out in the Passenger transport manager in the house Try to increase protein an extra 15-20g a day See me again in 4 weeks

## 2022-11-18 ENCOUNTER — Telehealth: Payer: Self-pay | Admitting: Internal Medicine

## 2022-11-18 NOTE — Telephone Encounter (Signed)
Nicole [name] with [name] [phone]  Calling to FU on fax sent on 10/31/2022  Requesting:  Fracture code??  Becka informed that MD has been out for several  weeks.  Is another provider available to answer this inquiry?  Please advise.

## 2022-11-19 NOTE — Telephone Encounter (Signed)
LVM on confidential voicemail requesting Carl fax paperwork back due to being unable to find what she needs.

## 2022-11-20 NOTE — Telephone Encounter (Signed)
Pt states she tripped over uneven pavement on 09/09/22 & broke metatarsals. She is currently taking Vit D & Calcium  Last DEXA 02/06/20 (over 2 years ago)  Faxed form rec'd from [facility]. Will give to covering provider for signature & coding.

## 2022-11-25 ENCOUNTER — Other Ambulatory Visit: Payer: Self-pay | Admitting: Family

## 2022-11-25 DIAGNOSIS — M8580 Other specified disorders of bone density and structure, unspecified site: Secondary | ICD-10-CM

## 2022-12-02 NOTE — Progress Notes (Unsigned)
[name] [place] Phone: [phone] Subjective:   [name], am serving as a scribe for Dr. Hulan Saas.  I'm seeing this patient by the request  of:  [name], [name], MD  CC: Foot pain follow-up  [id number]  11/07/2022 Continues to show some healing but it is slow process. Likely underlying problems have been contributing. Continue the cam walker for now. Discussed icing regimen and home exercises. Which activities to do limitations to avoid. Patient will avoid walking barefoot. Continue vitamin D supplementation. The patient still shows delayed healing and she will consider bone stimulator. Follow-up again in 1 month   Update 12/08/2022 Nicole [name] is a 77 y.o. female coming in with complaint of 5th metatarsal fx. Patient states that she is "praying" it is healed, still has a twinge lateral right mid foot. Does not have any bad pain, wears her good tennis shoes walking all time.    Past Medical History:  Diagnosis Date   BRCA1 positive    Breast cancer ([place])    Left- Ductal ca SR-, Chemo ([facility]) rad rx    GERD (gastroesophageal reflux disease)    H/O migraine    HSV-2 (herpes simplex virus 2) infection    Hypercholesteremia    Rheumatoid factor positive    OA   had rheum consult   Scoliosis    Sprain of ankle 03/06/2015   revewiwed care exercise and fu if needed     Tinnitus    Past Surgical History:  Procedure Laterality Date   BREAST LUMPECTOMY     LAPAROSCOPIC BILATERAL SALPINGO OOPHERECTOMY  2006   Dr. Joan Flores   MASTECTOMY Bilateral 2006   Social History   Socioeconomic History   Marital status: Widowed    Spouse name: Not on file   Number of children: Not on file   Years of education: Not on file   Highest education level: Not on file  Occupational History   Not on file  Tobacco Use   Smoking status: Former   Smokeless tobacco: Never   Tobacco comments:    limited in  college   Vaping Use   Vaping Use: Never used  Substance and Sexual Activity   Alcohol use: Yes    Alcohol/week: 7.0 standard drinks of alcohol    Types: 7 Glasses of wine per week   Drug use: No   Sexual activity: Not Currently    Birth control/protection: Post-menopausal  Other Topics Concern   Not on file  Social History Narrative   Married, household of 2. 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 Designated Party release signed 04/17/10.      Husband  prostate cancer at this time. Passed  Nov 28 2020   hh of 1    Social Determinants of Health   Financial Resource Strain: Low Risk  (03/07/2022)   Overall Financial Resource Strain (CARDIA)    Difficulty of Paying Living Expenses: Not hard at all  Food Insecurity: No Food Insecurity (03/07/2022)   Hunger Vital Sign    Worried About Running Out of Food in the Last Year: Never true    Ran Out of Food in the Last Year: Never true  Transportation Needs: No Transportation Needs (03/07/2022)   PRAPARE - Hydrologist (Medical): No    Lack of Transportation (Non-Medical): No  Physical Activity: Insufficiently Active (03/07/2022)   Exercise Vital Sign    Days of  Exercise per Week: 3 days    Minutes of Exercise per Session: 30 min  Stress: Stress Concern Present (03/07/2022)   [place]    Feeling of Stress : To some extent  Social Connections: Socially Isolated (03/07/2022)   Social Connection and Isolation Panel [NHANES]    Frequency of Communication with Friends and Family: More than three times a week    Frequency of Social Gatherings with Friends and Family: More than three times a week    Attends Religious Services: Never    Marine scientist or Organizations: No    Attends Archivist Meetings: Never    Marital Status: Widowed   Allergies  Allergen Reactions   Sulfonamide Derivatives Nausea And  Vomiting   Latex Rash   Family History  Problem Relation Age of Onset   Hypertension Mother    Heart disease Mother    Stroke Mother    Arthritis Mother    Heart attack Brother    Arthritis Other        maternal side RA cousin   Kidney disease Other        maternal side   Diabetes Other        1st degree relative, paternal side   Diabetes Father    Lung cancer Father      Current Outpatient Medications (Cardiovascular):    lovastatin (MEVACOR) 20 MG tablet, Take 1 tablet (20 mg total) by mouth daily.     Current Outpatient Medications (Other):    acyclovir (ZOVIRAX) 200 MG capsule, Take 2 capsules by mouth 3 times daily for outbreaks   acyclovir ointment (ZOVIRAX) 5 %, Apply 1 application topically 3 (three) times daily as needed.   calcium carbonate (OS-CAL) 600 MG TABS, Take 600 mg by mouth 3 (three) times daily with meals.   famotidine (PEPCID) 20 MG tablet, Take 20 mg by mouth 2 (two) times daily.   Magnesium 300 MG CAPS, Take 1 capsule by mouth daily.   Reviewed prior external information including notes and imaging from  primary care provider As well as notes that were available from care everywhere and other healthcare systems.  Past medical history, social, surgical and family history all reviewed in electronic medical record.  No pertanent information unless stated regarding to the chief complaint.   Review of Systems:  No headache, visual changes, nausea, vomiting, diarrhea, constipation, dizziness, abdominal pain, skin rash, fevers, chills, night sweats, weight loss, swollen lymph nodes, body aches, joint swelling, chest pain, shortness of breath, mood changes. POSITIVE muscle aches  Objective  Blood pressure 110/72, pulse 80, height '5\' 1"'$  (1.549 m), weight 127 lb (57.6 kg), last menstrual period 11/24/1997, SpO2 99 %.   General: No apparent distress alert and oriented x3 mood and affect normal, dressed appropriately.  HEENT: Pupils equal, extraocular  movements intact  Respiratory: Patient's speak in full sentences and does not appear short of breath  Cardiovascular: No lower extremity edema, non tender, no erythema  Normal gait which is an improvement.  Tender to palpation still over the paraspinal musculature.  Patient's foot exam low does not have significant swelling like patient had previously.  Still tender to palpation over the fifth metatarsal.  Limited muscular skeletal ultrasound was performed and interpreted by Hulan Saas, M   Limited ultrasound shows there is still a cortical irregularity on the lateral aspect patient on the medial aspect has a good hard callus noted as well as over the  dorsal aspect of the fifth metatarsal. Impression: Healing fifth metatarsal fracture      Impression and Recommendations:     The above documentation has been reviewed and is accurate and complete Lyndal Pulley, DO

## 2022-12-03 DIAGNOSIS — L821 Other seborrheic keratosis: Secondary | ICD-10-CM | POA: Diagnosis not present

## 2022-12-03 DIAGNOSIS — L814 Other melanin hyperpigmentation: Secondary | ICD-10-CM | POA: Diagnosis not present

## 2022-12-03 DIAGNOSIS — D225 Melanocytic nevi of trunk: Secondary | ICD-10-CM | POA: Diagnosis not present

## 2022-12-03 DIAGNOSIS — D2262 Melanocytic nevi of left upper limb, including shoulder: Secondary | ICD-10-CM | POA: Diagnosis not present

## 2022-12-03 DIAGNOSIS — D1801 Hemangioma of skin and subcutaneous tissue: Secondary | ICD-10-CM | POA: Diagnosis not present

## 2022-12-03 DIAGNOSIS — L570 Actinic keratosis: Secondary | ICD-10-CM | POA: Diagnosis not present

## 2022-12-08 ENCOUNTER — Ambulatory Visit: Payer: Medicare PPO | Admitting: Family Medicine

## 2022-12-08 ENCOUNTER — Ambulatory Visit (INDEPENDENT_AMBULATORY_CARE_PROVIDER_SITE_OTHER): Payer: Medicare PPO

## 2022-12-08 ENCOUNTER — Ambulatory Visit: Payer: Self-pay

## 2022-12-08 VITALS — BP 110/72 | HR 80 | Ht 61.0 in | Wt 127.0 lb

## 2022-12-08 DIAGNOSIS — S92354G Nondisplaced fracture of fifth metatarsal bone, right foot, subsequent encounter for fracture with delayed healing: Secondary | ICD-10-CM | POA: Diagnosis not present

## 2022-12-08 DIAGNOSIS — M79671 Pain in right foot: Secondary | ICD-10-CM

## 2022-12-08 DIAGNOSIS — S92351A Displaced fracture of fifth metatarsal bone, right foot, initial encounter for closed fracture: Secondary | ICD-10-CM | POA: Diagnosis not present

## 2022-12-08 NOTE — Assessment & Plan Note (Signed)
Patient does have improvement noted as well.  Discussed icing regimen.  Discussed the rigid soled boot.  Increase activity slowly otherwise.  Follow-up again in 8 weeks.

## 2022-12-08 NOTE — Patient Instructions (Addendum)
Good to see you  Okay to start dancing in hiking boot Feb 1st Okay to start dancing after valentines day with regular shoes Follow up in 2 months

## 2023-01-05 ENCOUNTER — Other Ambulatory Visit (HOSPITAL_COMMUNITY): Payer: Self-pay

## 2023-01-16 ENCOUNTER — Encounter: Payer: Self-pay | Admitting: Family Medicine

## 2023-01-16 ENCOUNTER — Ambulatory Visit: Payer: Medicare PPO | Admitting: Family Medicine

## 2023-01-16 VITALS — BP 130/74 | HR 85 | Temp 98.6°F | Ht 61.0 in | Wt 125.2 lb

## 2023-01-16 DIAGNOSIS — J069 Acute upper respiratory infection, unspecified: Secondary | ICD-10-CM

## 2023-01-16 MED ORDER — AMOXICILLIN-POT CLAVULANATE 875-125 MG PO TABS
1.0000 | ORAL_TABLET | Freq: Two times a day (BID) | ORAL | 0 refills | Status: DC
  Filled 2023-01-17: qty 20, 10d supply, fill #0

## 2023-01-16 NOTE — Progress Notes (Signed)
Established Patient Office Visit  Subjective   Patient ID: Nicole [name], female    DOB: [birth year]  Age: 77 y.o. MRN: [id number]  Chief Complaint  Patient presents with   Sore Throat    Patient complains of sore throat, x5 days, Tried Over the counter cold medication    Cough    Patient complains of cough, x5 days, Productive with yellow sputum    Headache    Patient complains of headaches, x5 days    Nasal Congestion    Patient complains of nasal congestion, x5 days     HPI   Nicole [name] is seen as a work in with onset last Sunday night of some thickened drainage from her sinus.  By Monday she had some headache, earache, sore throat, and fatigue.  Symptoms have persisted through the week.  Does not feel any better at this time.  No fever.  Home COVID test was negative on Monday.  She was around a lot of people last Saturday but no known sick contacts.  Denies any nausea, vomiting, or diarrhea.  She is getting some thick yellow mucus from her sinuses at this time with intermittent headache and occasional facial pain.  Past Medical History:  Diagnosis Date   BRCA1 positive    Breast cancer ([place])    Left- Ductal ca SR-, Chemo ([facility]) rad rx    GERD (gastroesophageal reflux disease)    H/O migraine    HSV-2 (herpes simplex virus 2) infection    Hypercholesteremia    Rheumatoid factor positive    OA   had rheum consult   Scoliosis    Sprain of ankle 03/06/2015   revewiwed care exercise and fu if needed     Tinnitus    Past Surgical History:  Procedure Laterality Date   BREAST LUMPECTOMY     LAPAROSCOPIC BILATERAL SALPINGO OOPHERECTOMY  2006   Dr. Joan Flores   MASTECTOMY Bilateral 2006    reports that she has quit smoking. She has never used smokeless tobacco. She reports current alcohol use of about 7.0 standard drinks of alcohol per week. She reports that she does not use drugs. family history includes Arthritis in her mother and another family member; Diabetes in her  father and another family member; Heart attack in her brother; Heart disease in her mother; Hypertension in her mother; Kidney disease in an other family member; Lung cancer in her father; Stroke in her mother. Allergies  Allergen Reactions   Sulfonamide Derivatives Nausea And Vomiting   Latex Rash    Review of Systems  Constitutional:  Positive for malaise/fatigue. Negative for chills and fever.  HENT:  Positive for congestion, ear pain and sore throat.   Respiratory:  Positive for cough.   Gastrointestinal:  Negative for abdominal pain, nausea and vomiting.  Neurological:  Positive for headaches.      Objective:     BP 130/74 (BP Location: Left Arm, Patient Position: Sitting, Cuff Size: Normal)   Pulse 85   Temp 98.6 F (37 C) (Oral)   Ht '5\' 1"'$  (1.549 m)   Wt 125 lb 3.2 oz (56.8 kg)   LMP 11/24/1997 (Approximate)   SpO2 98%   BMI 23.66 kg/m  BP Readings from Last 3 Encounters:  01/16/23 130/74  12/08/22 110/72  11/07/22 110/80   Wt Readings from Last 3 Encounters:  01/16/23 125 lb 3.2 oz (56.8 kg)  12/08/22 127 lb (57.6 kg)  10/08/22 131 lb (59.4 kg)      Physical  Exam Vitals reviewed.  Constitutional:      General: She is not in acute distress.    Appearance: She is not ill-appearing.  HENT:     Ears:     Comments: Has some cerumen left canal obscuring full view of eardrum.  Right eardrum and ear canal appear normal.    Mouth/Throat:     Comments: Oropharynx is moist and clear.  Previous tonsillectomy.  No exudate.  No erythema. Cardiovascular:     Rate and Rhythm: Normal rate and regular rhythm.  Pulmonary:     Effort: Pulmonary effort is normal.     Breath sounds: Normal breath sounds. No wheezing or rales.  Musculoskeletal:     Cervical back: Neck supple.  Lymphadenopathy:     Cervical: No cervical adenopathy.  Neurological:     Mental Status: She is alert.      No results found for any visits on 01/16/23.    The 10-year ASCVD risk score  (Arnett DK, et al., 2019) is: 16.4%    Assessment & Plan:   Problem List Items Addressed This Visit   None Visit Diagnoses     Viral URI with cough    -  Primary     Suspect viral URI.  She has been prone to sinusitis issues in the past.  Nonfocal exam.  Home COVID test was negative.  Recommend observation for now and continue over-the-counter medications such as Mucinex and good hydration.  We did print prescription for Augmentin 875 mg twice daily for 10 days to feel if she has any persistent or worsening symptoms especially in the next week  No follow-ups on file.    Carolann Littler, MD

## 2023-01-17 ENCOUNTER — Other Ambulatory Visit (HOSPITAL_COMMUNITY): Payer: Self-pay

## 2023-01-21 ENCOUNTER — Ambulatory Visit
Admission: RE | Admit: 2023-01-21 | Discharge: 2023-01-21 | Disposition: A | Discharge status: Home / Self Care | Payer: Medicare PPO | Source: Ambulatory Visit | Attending: Family | Admitting: Family

## 2023-01-21 DIAGNOSIS — M8580 Other specified disorders of bone density and structure, unspecified site: Secondary | ICD-10-CM

## 2023-01-21 DIAGNOSIS — M810 Age-related osteoporosis without current pathological fracture: Secondary | ICD-10-CM | POA: Diagnosis not present

## 2023-01-21 DIAGNOSIS — M85852 Other specified disorders of bone density and structure, left thigh: Secondary | ICD-10-CM | POA: Diagnosis not present

## 2023-01-21 DIAGNOSIS — Z780 Asymptomatic menopausal state: Secondary | ICD-10-CM | POA: Diagnosis not present

## 2023-01-25 NOTE — Progress Notes (Signed)
Dexa shows decrease bone density  in forarm cw osteoporosis  and somewhat low in hip. Cannot compare to any in past because of  different methods.  Optimize calcium 1200 mg per day and vit d 1000-2000 Iu per day weight bearing  or resistance exercising .  Can consider  bone building meds in addition  either way repeat  dexa in 2 years .

## 2023-02-02 NOTE — Progress Notes (Signed)
Nicole [name] [address] [place] [address] Phone: [phone] Subjective:   Nicole [name], am serving as a scribe for Dr. Antoine Primas.  I'm seeing this patient by the request  of:  [name], Neta Mends, MD  CC: Right foot pain  FUX:[id number]  12/08/2022 Patient does have improvement noted as well. Discussed icing regimen. Discussed the rigid soled boot. Increase activity slowly otherwise. Follow-up again in 8 weeks.   Updated 02/03/2023 Nicole [name] is a 77 y.o. female coming in with complaint of R foot pain. Does still have aching in foot but is better than last visit.   At night she is having quad and GT pain since using the boot for her foot. This morning she also had pain in the R knee that radiates down into the tibia. Has been doing more [place] dancing on most days of the week which she feels may have contributed to her pain.        Past Medical History:  Diagnosis Date   BRCA1 positive    Breast cancer (HCC)    Left- Ductal ca SR-, Chemo (Magrinat) rad rx    GERD (gastroesophageal reflux disease)    H/O migraine    HSV-2 (herpes simplex virus 2) infection    Hypercholesteremia    Rheumatoid factor positive    OA   had rheum consult   Scoliosis    Sprain of ankle 03/06/2015   revewiwed care exercise and fu if needed     Tinnitus    Past Surgical History:  Procedure Laterality Date   BREAST LUMPECTOMY     LAPAROSCOPIC BILATERAL SALPINGO OOPHERECTOMY  2006   Dr. Tresa Res   MASTECTOMY Bilateral 2006   Social History   Socioeconomic History   Marital status: Widowed    Spouse name: Not on file   Number of children: Not on file   Years of education: Not on file   Highest education level: Not on file  Occupational History   Not on file  Tobacco Use   Smoking status: Former   Smokeless tobacco: Never   Tobacco comments:    limited in college   Vaping Use   Vaping Use: Never used  Substance and Sexual Activity    Alcohol use: Yes    Alcohol/week: 7.0 standard drinks of alcohol    Types: 7 Glasses of wine per week   Drug use: No   Sexual activity: Not Currently    Birth control/protection: Post-menopausal  Other Topics Concern   Not on file  Social History Narrative   Married, 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 Designated Party release signed 04/17/10.      Husband  prostate cancer at this time. Passed  Nov 28 2020   hh of 1    Social Determinants of Health   Financial Resource Strain: Low Risk  (03/07/2022)   Overall Financial Resource Strain (CARDIA)    Difficulty of Paying Living Expenses: Not hard at all  Food Insecurity: No Food Insecurity (03/07/2022)   Hunger Vital Sign    Worried About Running Out of Food in the Last Year: Never true    Ran Out of Food in the Last Year: Never true  Transportation Needs: No Transportation Needs (03/07/2022)   PRAPARE - Administrator, Civil Service (Medical): No    Lack of Transportation (Non-Medical): No  Physical Activity: Insufficiently Active (03/07/2022)   Exercise Vital Sign  Days of Exercise per Week: 3 days    Minutes of Exercise per Session: 30 min  Stress: Stress Concern Present (03/07/2022)   [employer] of Occupational Health - Occupational Stress Questionnaire    Feeling of Stress : To some extent  Social Connections: Socially Isolated (03/07/2022)   Social Connection and Isolation Panel [NHANES]    Frequency of Communication with Friends and Family: More than three times a week    Frequency of Social Gatherings with Friends and Family: More than three times a week    Attends Religious Services: Never    Database administrator or Organizations: No    Attends Banker Meetings: Never    Marital Status: Widowed   Allergies  Allergen Reactions   Sulfonamide Derivatives Nausea And Vomiting   Latex Rash   Family History  Problem Relation Age of Onset   Hypertension  Mother    Heart disease Mother    Stroke Mother    Arthritis Mother    Heart attack Brother    Arthritis Other        maternal side RA cousin   Kidney disease Other        maternal side   Diabetes Other        1st degree relative, paternal side   Diabetes Father    Lung cancer Father      Current Outpatient Medications (Cardiovascular):    lovastatin (MEVACOR) 20 MG tablet, Take 1 tablet (20 mg total) by mouth daily.     Current Outpatient Medications (Other):    acyclovir (ZOVIRAX) 200 MG capsule, Take 2 capsules by mouth 3 times daily for outbreaks   acyclovir ointment (ZOVIRAX) 5 %, Apply 1 application topically 3 (three) times daily as needed.   amoxicillin-clavulanate (AUGMENTIN) 875-125 MG tablet, Take 1 tablet by mouth 2 (two) times daily.   calcium carbonate (OS-CAL) 600 MG TABS, Take 600 mg by mouth 3 (three) times daily with meals.   famotidine (PEPCID) 20 MG tablet, Take 20 mg by mouth 2 (two) times daily.   Magnesium 300 MG CAPS, Take 1 capsule by mouth daily.   Multiple Vitamin (MULTIVITAMIN) capsule, Take 1 capsule by mouth daily.   Multiple Vitamins-Minerals (HAIR VITAMINS PO), Take by mouth.   VITAMIN D PO, Take by mouth.   Reviewed prior external information including notes and imaging from  primary care provider As well as notes that were available from care everywhere and other healthcare systems.  Past medical history, social, surgical and family history all reviewed in electronic medical record.  No pertanent information unless stated regarding to the chief complaint.   Review of Systems:  No headache, visual changes, nausea, vomiting, diarrhea, constipation, dizziness, abdominal pain, skin rash, fevers, chills, night sweats, weight loss, swollen lymph nodes, body aches, joint swelling, chest pain, shortness of breath, mood changes. POSITIVE muscle aches  Objective  Blood pressure 122/78, pulse 79, height 5\' 1"  (1.549 m), weight 126 lb (57.2 kg), last  menstrual period 11/24/1997, SpO2 98 %.   General: No apparent distress alert and oriented x3 mood and affect normal, dressed appropriately.  HEENT: Pupils equal, extraocular movements intact  Respiratory: Patient's speak in full sentences and does not appear short of breath  Cardiovascular: No lower extremity edema, non tender, no erythema  Very mild antalgic gait noted.  Patient is nontender over the head of the fifth metatarsal proximally.   Limited muscular skeletal ultrasound was performed and interpreted by Antoine Primas, M   Foot  exam shows but there is no significant cortical irregularity anymore any hypoechoic changes.  Overall seems to be healed. Impression: Healed fracture        Impression and Recommendations:     The above documentation has been reviewed and is accurate and complete Judi Saa, DO

## 2023-02-03 ENCOUNTER — Encounter: Payer: Self-pay | Admitting: Family Medicine

## 2023-02-03 ENCOUNTER — Ambulatory Visit: Payer: Medicare PPO | Admitting: Family Medicine

## 2023-02-03 ENCOUNTER — Ambulatory Visit: Payer: Self-pay

## 2023-02-03 VITALS — BP 122/78 | HR 79 | Ht 61.0 in | Wt 126.0 lb

## 2023-02-03 DIAGNOSIS — M7061 Trochanteric bursitis, right hip: Secondary | ICD-10-CM | POA: Diagnosis not present

## 2023-02-03 DIAGNOSIS — S92354G Nondisplaced fracture of fifth metatarsal bone, right foot, subsequent encounter for fracture with delayed healing: Secondary | ICD-10-CM

## 2023-02-03 DIAGNOSIS — M79671 Pain in right foot: Secondary | ICD-10-CM

## 2023-02-03 NOTE — Patient Instructions (Signed)
See me after [place]

## 2023-02-03 NOTE — Assessment & Plan Note (Signed)
History of bursitis, do not think that any or injection may be necessary but if increasing the walking pain dancing again we will consider it at follow-up.

## 2023-02-03 NOTE — Assessment & Plan Note (Signed)
Healed overall noted  Patient will continue to wear good shoes, will be going to [place] in the next month.  Follow-up with me again in 3 months to make sure that no significant worsening.

## 2023-02-04 NOTE — Progress Notes (Signed)
Osteoporosis in the arm.  Need to optimize  vit d 1000 per day and calcium 600 mg twice a day and resistance exercise  . Can consider other bone building meds  .  Repeat dexa in 2 years. Can make appt if want to discuss further.  Or at next visit

## 2023-02-27 ENCOUNTER — Telehealth: Payer: Self-pay | Admitting: Internal Medicine

## 2023-02-27 ENCOUNTER — Other Ambulatory Visit: Payer: Self-pay | Admitting: Internal Medicine

## 2023-02-27 NOTE — Telephone Encounter (Signed)
Prescription Request  02/27/2023  LOV: 06/16/2022  What is the name of the medication or equipment? acyclovir ointment (ZOVIRAX) 5 %   Pt is aware MD is OOO on Mondays and Fridays.  Have you contacted your pharmacy to request a refill? Yes   Which pharmacy would you like this sent to?   [facility] Phone: [phone]  Fax: [phone]       Patient notified that their request is being sent to the clinical staff for review and that they should receive a response within 2 business days.   Please advise at Mobile [phone] (mobile)

## 2023-02-27 NOTE — Telephone Encounter (Signed)
Last refill-04/30/2011* Last OV-06/16/22  Next OV-03/11/2023

## 2023-03-02 NOTE — Telephone Encounter (Signed)
Pt is calling checking on medication she is aware does not work on Friday nor Monday and will be back in office tomorrow

## 2023-03-04 ENCOUNTER — Other Ambulatory Visit (HOSPITAL_COMMUNITY): Payer: Self-pay

## 2023-03-04 DIAGNOSIS — L245 Irritant contact dermatitis due to other chemical products: Secondary | ICD-10-CM | POA: Diagnosis not present

## 2023-03-04 DIAGNOSIS — L570 Actinic keratosis: Secondary | ICD-10-CM | POA: Diagnosis not present

## 2023-03-04 MED ORDER — ACYCLOVIR 200 MG PO CAPS
400.0000 mg | ORAL_CAPSULE | Freq: Three times a day (TID) | ORAL | 3 refills | Status: DC
  Filled 2023-03-04 – 2023-03-13 (×2): qty 60, 10d supply, fill #0
  Filled 2023-08-07: qty 60, 10d supply, fill #1
  Filled 2023-09-14: qty 60, 10d supply, fill #2
  Filled 2023-12-01: qty 60, 10d supply, fill #3

## 2023-03-04 MED ORDER — MAGNESIUM 300 MG PO CAPS
1.0000 | ORAL_CAPSULE | Freq: Every day | ORAL | 3 refills | Status: AC
  Filled 2023-03-04: qty 30, 30d supply, fill #0

## 2023-03-04 NOTE — Telephone Encounter (Signed)
I havent been rx ing this  plan talk with me at the updoming visit

## 2023-03-04 NOTE — Telephone Encounter (Signed)
OK to refill med  with 3 refills

## 2023-03-04 NOTE — Telephone Encounter (Signed)
Rx sent 

## 2023-03-11 ENCOUNTER — Ambulatory Visit: Payer: Medicare PPO | Admitting: Internal Medicine

## 2023-03-11 ENCOUNTER — Ambulatory Visit: Payer: Medicare PPO

## 2023-03-11 ENCOUNTER — Encounter: Payer: Self-pay | Admitting: Internal Medicine

## 2023-03-11 ENCOUNTER — Other Ambulatory Visit: Payer: Self-pay | Admitting: Internal Medicine

## 2023-03-11 ENCOUNTER — Other Ambulatory Visit (HOSPITAL_COMMUNITY): Payer: Self-pay

## 2023-03-11 VITALS — BP 120/78 | HR 73 | Temp 98.0°F | Ht 61.0 in | Wt 123.2 lb

## 2023-03-11 DIAGNOSIS — Z7184 Encounter for health counseling related to travel: Secondary | ICD-10-CM

## 2023-03-11 DIAGNOSIS — Z9221 Personal history of antineoplastic chemotherapy: Secondary | ICD-10-CM | POA: Diagnosis not present

## 2023-03-11 DIAGNOSIS — H6122 Impacted cerumen, left ear: Secondary | ICD-10-CM

## 2023-03-11 DIAGNOSIS — M810 Age-related osteoporosis without current pathological fracture: Secondary | ICD-10-CM

## 2023-03-11 DIAGNOSIS — F4322 Adjustment disorder with anxiety: Secondary | ICD-10-CM | POA: Diagnosis not present

## 2023-03-11 MED ORDER — HYDROXYZINE PAMOATE 25 MG PO CAPS
25.0000 mg | ORAL_CAPSULE | Freq: Three times a day (TID) | ORAL | 0 refills | Status: DC | PRN
  Filled 2023-03-11: qty 30, 10d supply, fill #0

## 2023-03-11 MED ORDER — LORAZEPAM 0.5 MG PO TABS
0.5000 mg | ORAL_TABLET | Freq: Three times a day (TID) | ORAL | 0 refills | Status: DC | PRN
  Filled 2023-03-11: qty 30, 9d supply, fill #0

## 2023-03-11 MED ORDER — ACYCLOVIR 5 % EX OINT
1.0000 | TOPICAL_OINTMENT | Freq: Three times a day (TID) | CUTANEOUS | 6 refills | Status: DC | PRN
  Filled 2023-03-11: qty 5, 5d supply, fill #0
  Filled 2023-03-13: qty 5, 7d supply, fill #0
  Filled 2023-08-07: qty 5, 2d supply, fill #0
  Filled 2023-09-16: qty 15, 5d supply, fill #0
  Filled 2023-09-16 – 2023-09-22 (×2): qty 5, 2d supply, fill #0

## 2023-03-11 NOTE — Patient Instructions (Addendum)
Good to see you today  Asesss  Vit D  after   stable knowing vit d intake .  Implement  weight bearing exercise  resistance bands .   I can refer consult to Endocrine  .  To get more information.   Will order lorazepam as needed for anxiety  and hydroxyzine  at  might might help.

## 2023-03-11 NOTE — Progress Notes (Signed)
Chief Complaint  Patient presents with   Follow-up    HPI: Nicole [name] 77 y.o. come in for Chronic disease management  bone health  Had times of dec acitvity after foot ffracture and when was caretaking husband  with cancer  She has hx of chemo and concern about jaw dental health risk  Dexa  L forearm -2.7 lth -1.5 Lfn -2.0    mobild so cannot compare    Taking vit d 2 gummies .not sure how many units   Mom had oa . Not osteoporosis? Increasing activity has 5 # upper body weights  Signed up for dance  class  to travel [place]  dance work shop .  Leaving April 27  As palpitations anxiety  and asks for some help if needed  traveling alone .   Check ears  told has wax  hearing seems ok  Refill smaller amount zovirax    ROS: See pertinent positives and negatives per HPI.  Past Medical History:  Diagnosis Date   BRCA1 positive    Breast cancer    Left- Ductal ca SR-, Chemo (Magrinat) rad rx    GERD (gastroesophageal reflux disease)    H/O migraine    HSV-2 (herpes simplex virus 2) infection    Hypercholesteremia    Rheumatoid factor positive    OA   had rheum consult   Scoliosis    Sprain of ankle 03/06/2015   revewiwed care exercise and fu if needed     Tinnitus     Family History  Problem Relation Age of Onset   Hypertension Mother    Heart disease Mother    Stroke Mother    Arthritis Mother    Heart attack Brother    Arthritis Other        maternal side RA cousin   Kidney disease Other        maternal side   Diabetes Other        1st degree relative, paternal side   Diabetes Father    Lung cancer Father     Social History   Socioeconomic History   Marital status: Widowed    Spouse name: Not on file   Number of children: Not on file   Years of education: Not on file   Highest education level: Master's degree (e.g., MA, MS, MEng, MEd, MSW, MBA)  Occupational History   Not on file  Tobacco Use   Smoking status: Former    Smokeless tobacco: Never   Tobacco comments:    limited in college   Vaping Use   Vaping Use: Never used  Substance and Sexual Activity   Alcohol use: Yes    Alcohol/week: 7.0 standard drinks of alcohol    Types: 7 Glasses of wine per week   Drug use: No   Sexual activity: Not Currently    Birth control/protection: Post-menopausal  Other Topics Concern   Not on file  Social History Narrative   Married, 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 Designated Party release signed 04/17/10.      Husband  prostate cancer at this time. Passed  Nov 28 2020   hh of 1    Social Determinants of Health   Financial Resource Strain: Low Risk  (03/10/2023)   Overall Financial Resource Strain (CARDIA)    Difficulty of Paying Living Expenses: Not hard at all  Food Insecurity: No Food Insecurity (03/10/2023)   Hunger  Vital Sign    Worried About Programme researcher, broadcasting/film/video in the Last Year: Never true    Ran Out of Food in the Last Year: Never true  Transportation Needs: No Transportation Needs (03/10/2023)   PRAPARE - Administrator, Civil Service (Medical): No    Lack of Transportation (Non-Medical): No  Physical Activity: Sufficiently Active (03/10/2023)   Exercise Vital Sign    Days of Exercise per Week: 5 days    Minutes of Exercise per Session: 40 min  Stress: Stress Concern Present (03/10/2023)   [employer] of Occupational Health - Occupational Stress Questionnaire    Feeling of Stress : Very much  Social Connections: Unknown (03/10/2023)   Social Connection and Isolation Panel [NHANES]    Frequency of Communication with Friends and Family: More than three times a week    Frequency of Social Gatherings with Friends and Family: More than three times a week    Attends Religious Services: Patient declined    Database administrator or Organizations: No    Attends Banker Meetings: Not on file    Marital Status: Widowed    Outpatient  Medications Prior to Visit  Medication Sig Dispense Refill   acyclovir (ZOVIRAX) 200 MG capsule Take 2 capsules (400 mg total) by mouth 3 (three) times daily for outbreaks 60 capsule 3   calcium carbonate (OS-CAL) 600 MG TABS Take 600 mg by mouth 3 (three) times daily with meals.     famotidine (PEPCID) 20 MG tablet Take 20 mg by mouth 2 (two) times daily.     lovastatin (MEVACOR) 20 MG tablet Take 1 tablet (20 mg total) by mouth daily. 90 tablet 3   Magnesium 300 MG CAPS Take 1 capsule (300 mg total) by mouth daily. 30 capsule 3   Multiple Vitamin (MULTIVITAMIN) capsule Take 1 capsule by mouth daily.     Multiple Vitamins-Minerals (HAIR VITAMINS PO) Take by mouth.     VITAMIN D PO Take by mouth.     acyclovir ointment (ZOVIRAX) 5 % Apply 1 application topically 3 (three) times daily as needed. 30 g 6   amoxicillin-clavulanate (AUGMENTIN) 875-125 MG tablet Take 1 tablet by mouth 2 (two) times daily. 20 tablet 0   No facility-administered medications prior to visit.     EXAM:  BP 120/78 (BP Location: Left Arm, Patient Position: Sitting, Cuff Size: Normal)   Pulse 73   Temp 98 F (36.7 C) (Oral)   Ht  (1.549 m)   Wt 123 lb 3.2 oz (55.9 kg)   LMP 11/24/1997 (Approximate)   SpO2 97%   BMI 23.28 kg/m   Body mass index is 23.28 kg/m.  GENERAL: vitals reviewed and listed above, alert, oriented, appears well hydrated and in no acute distress HEENT: atraumatic, conjunctiva  clear, no obvious abnormalities on inspection of external nose and ears r tm nl in tact left eac brown wax noted  tm not visualized  MS: moves all extremities without noticeable focal  abnormality PSYCH: pleasant and cooperative, no obvious depression or anxiety Lab Results  Component Value Date   WBC 4.8 06/16/2022   HGB 12.2 06/16/2022   HCT 36.9 06/16/2022   PLT 247.0 06/16/2022   GLUCOSE 88 06/16/2022   CHOL 178 06/16/2022   TRIG 102.0 06/16/2022   HDL 79.30 06/16/2022   LDLDIRECT 119.9 12/30/2012    LDLCALC 79 06/16/2022   ALT 16 06/16/2022   AST 21 06/16/2022   NA 140 06/16/2022  K 4.8 06/16/2022   CL 100 06/16/2022   CREATININE 0.85 06/16/2022   BUN 14 06/16/2022   CO2 33 (H) 06/16/2022   TSH 0.79 06/16/2022   HGBA1C 6.2 06/16/2022   BP Readings from Last 3 Encounters:  03/11/23 120/78  02/03/23 122/78  01/16/23 130/74    ASSESSMENT AND PLAN:  Discussed the following assessment and plan:  Osteoporosis without current pathological fracture, unspecified osteoporosis type - Plan: Ambulatory referral to Endocrinology  Counseling for travel  Transient adjustment reaction with anxiety - travel  trial lorazepam if needed for palpititaions   can try hydroz for sleep also  dis risk benefit  History of cancer chemotherapy - Plan: Ambulatory referral to Endocrinology  Excessive ear wax, left - disc options  drops time no q tips can flush  or come back if needed Palpitations at times anxiety travel  Disc use of meds lorazepam If needed  hydroxyzine may help sleep . Ascertain vit d intake  Options of bone building or anabolic   Hx of chemo at risk of complications  Will do referral to endocrine to further consult.  In interim weight bearing activity . -Patient advised to return or notify health care team  if  new concerns arise.  Patient Instructions  Good to see you today  Asesss  Vit D  after   stable knowing vit d intake .  Implement  weight bearing exercise  resistance bands .   I can refer consult to Endocrine  .  To get more information.   Will order lorazepam as needed for anxiety  and hydroxyzine  at  might might help.       Neta Mends. Abrina Petz M.D.

## 2023-03-12 ENCOUNTER — Encounter: Payer: Self-pay | Admitting: Internal Medicine

## 2023-03-12 ENCOUNTER — Other Ambulatory Visit (HOSPITAL_COMMUNITY): Payer: Self-pay

## 2023-03-13 ENCOUNTER — Other Ambulatory Visit (HOSPITAL_COMMUNITY): Payer: Self-pay

## 2023-03-17 NOTE — Telephone Encounter (Signed)
Thanks  for the info So take the equivalent of 2000-3000IU per day

## 2023-04-11 ENCOUNTER — Other Ambulatory Visit (HOSPITAL_COMMUNITY): Payer: Self-pay

## 2023-04-16 DIAGNOSIS — L570 Actinic keratosis: Secondary | ICD-10-CM | POA: Diagnosis not present

## 2023-05-19 ENCOUNTER — Other Ambulatory Visit: Payer: Medicare PPO

## 2023-05-19 DIAGNOSIS — H2513 Age-related nuclear cataract, bilateral: Secondary | ICD-10-CM | POA: Diagnosis not present

## 2023-05-19 DIAGNOSIS — H5213 Myopia, bilateral: Secondary | ICD-10-CM | POA: Diagnosis not present

## 2023-05-19 DIAGNOSIS — H524 Presbyopia: Secondary | ICD-10-CM | POA: Diagnosis not present

## 2023-05-19 DIAGNOSIS — H52203 Unspecified astigmatism, bilateral: Secondary | ICD-10-CM | POA: Diagnosis not present

## 2023-05-19 DIAGNOSIS — H04123 Dry eye syndrome of bilateral lacrimal glands: Secondary | ICD-10-CM | POA: Diagnosis not present

## 2023-06-09 ENCOUNTER — Telehealth: Payer: Medicare PPO | Admitting: Family Medicine

## 2023-06-09 DIAGNOSIS — Z0000 Encounter for general adult medical examination without abnormal findings: Secondary | ICD-10-CM | POA: Diagnosis not present

## 2023-06-09 NOTE — Progress Notes (Signed)
PATIENT CHECK-IN and HEALTH RISK ASSESSMENT QUESTIONNAIRE:  -completed by phone/video for upcoming Medicare Preventive Visit  Pre-Visit Check-in: 1)Vitals (height, wt, BP, etc) - record in vitals section for visit on day of visit 2)Review and Update Medications, Allergies PMH, Surgeries, Social history in Epic 3)Hospitalizations in the last year with date/reason?  No   4)Review and Update Care Team (patient's specialists) in Epic 5) Complete PHQ9 in Epic  6) Complete Fall Screening in Epic 7)Review all Health Maintenance Due and order under PCP if not done.  8)Medicare Wellness Questionnaire: Answer theses question about your habits: Do you drink alcohol? Yes  If yes, how many drinks do you have a day? 1 glass a day/ 5 nights  Have you ever smoked? yes  Quit date if applicable? 1980  How many packs a day do/did you smoke? unknown Do you use smokeless tobacco? No  Do you use an illicit drugs? No  Do you exercises? Sometimes IF so, what type and how many days/minutes per week? Dancing, reports stays very active, also walks - 2-3 miles at a time, has a weight set up at home - has been doing some exercises at home Are you sexually active?  No Number of partners? N/a Typical breakfast: whole wheat bread, and banana coffee/ boiled egg with or with out toast , sometimes walnuts or leftovers  Typical lunch salad - tuna salad and crackers Typical dinner salad with vegetables, fried sweet potatoes,  Typical snacks:n/a  Beverages: water, coffee, sometimes a coke, decaf tea, occasional beer, blood orange juice   Answer theses question about you: Can you perform most household chores?  Yes  Do you find it hard to follow a conversation in a noisy room? Yes  Do you often ask people to speak up or repeat themselves? no  Do you feel that you have a problem with memory? Yes,just the normal with her age  Do you balance your checkbook and or bank acounts? yes  Do you feel safe at home? Yes  Last  dentist visit? 6 months ago, one coming up Do you need assistance with any of the following: Please note if so  no   Driving?  Feeding yourself?  Getting from bed to chair?  Getting to the toilet?  Bathing or showering?  Dressing yourself?  Managing money?  Climbing a flight of stairs  Preparing meals?  Do you have Advanced Directives in place (Living Will, Healthcare Power or Attorney)? Yes    Last eye Exam and location? 1 month ago   Do you currently use prescribed or non-prescribed narcotic or opioid pain medications? no  Do you have a history or close family history of breast, ovarian, tubal or peritoneal cancer or a family member with BRCA (breast cancer susceptibility 1 and 2) gene mutations?yes, she had BRCA 1, had breast cancer, followed closely by gyn and had prophylactic surgery  Nurse/Assistant Credentials/time stamp: Tora Perches CMA 10:46 am   Per patient no change in vitals since last visit, unable to obtain new vitals due to telehealth visit".    ----------------------------------------------------------------------------------------------------------------------------------------------------------------------------------------------------------------------   MEDICARE ANNUAL PREVENTIVE VISIT WITH PROVIDER: (Welcome to Medicare, initial annual wellness or annual wellness exam)  Virtual Visit via Video Note  I connected with Druanne R Mondesir on 06/09/23 b a video enabled telemedicine application and verified that I am speaking with the correct person using two identifiers. Had some technology issues part way through the visit and had to complete in audio only.   Location patient: home Location  provider:work or home office Persons participating in the virtual visit: patient, provider  Concerns and/or follow up today: still feels sad at times about losing husband 2 years ago. Stays busy. Considering counseling. Feels is doing ok.   See HM section in Epic for other  details of completed HM.    ROS: negative for report of fevers, unintentional weight loss, vision changes, vision loss, hearing loss or change, chest pain, sob, hemoptysis, melena, hematochezia, hematuria, falls, bleeding or bruising, thoughts of suicide or self harm, memory loss  Patient-completed extensive health risk assessment - reviewed and discussed with the patient: See Health Risk Assessment completed with patient prior to the visit either above or in recent phone note. This was reviewed in detailed with the patient today and appropriate recommendations, orders and referrals were placed as needed per Summary below and patient instructions.   Review of Medical History: -PMH, PSH, Family History and current specialty and care providers reviewed and updated and listed below   Patient Care Team: [name], Neta Mends, MD as PCP - General Pollyann Savoy, MD (Rheumatology) Estill Bamberg, MD as Consulting Physician (Orthopedic Surgery) Mack Hook, MD as Consulting Physician (Orthopedic Surgery) Janet Berlin, MD as Consulting Physician (Ophthalmology)   Past Medical History:  Diagnosis Date   BRCA1 positive    Breast cancer (HCC)    Left- Ductal ca SR-, Chemo ([facility]) rad rx    GERD (gastroesophageal reflux disease)    H/O migraine    HSV-2 (herpes simplex virus 2) infection    Hypercholesteremia    Rheumatoid factor positive    OA   had rheum consult   Scoliosis    Sprain of ankle 03/06/2015   revewiwed care exercise and fu if needed     Tinnitus     Past Surgical History:  Procedure Laterality Date   BREAST LUMPECTOMY     LAPAROSCOPIC BILATERAL SALPINGO OOPHERECTOMY  2006   Dr. Tresa Res   MASTECTOMY Bilateral 2006    Social History   Socioeconomic History   Marital status: Widowed    Spouse name: Not on file   Number of children: Not on file   Years of education: Not on file   Highest education level: Master's degree (e.g., MA, MS, MEng, MEd, MSW, MBA)   Occupational History   Not on file  Tobacco Use   Smoking status: Former   Smokeless tobacco: Never   Tobacco comments:    limited in college   Vaping Use   Vaping status: Never Used  Substance and Sexual Activity   Alcohol use: Yes    Alcohol/week: 7.0 standard drinks of alcohol    Types: 7 Glasses of wine per week   Drug use: No   Sexual activity: Not Currently    Birth control/protection: Post-menopausal  Other Topics Concern   Not on file  Social History Narrative   Married, 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 Designated Party release signed 04/17/10.      Husband  prostate cancer at this time. Passed  Nov 28 2020   hh of 1    Social Determinants of Health   Financial Resource Strain: Low Risk  (03/10/2023)   Overall Financial Resource Strain (CARDIA)    Difficulty of Paying Living Expenses: Not hard at all  Food Insecurity: No Food Insecurity (03/10/2023)   Hunger Vital Sign    Worried About Running Out of Food in the Last Year: Never true    Ran Out of Food in  the Last Year: Never true  Transportation Needs: No Transportation Needs (03/10/2023)   PRAPARE - Administrator, Civil Service (Medical): No    Lack of Transportation (Non-Medical): No  Physical Activity: Sufficiently Active (03/10/2023)   Exercise Vital Sign    Days of Exercise per Week: 5 days    Minutes of Exercise per Session: 40 min  Stress: Stress Concern Present (03/10/2023)   [employer] of Occupational Health - Occupational Stress Questionnaire    Feeling of Stress : Very much  Social Connections: Unknown (03/10/2023)   Social Connection and Isolation Panel [NHANES]    Frequency of Communication with Friends and Family: More than three times a week    Frequency of Social Gatherings with Friends and Family: More than three times a week    Attends Religious Services: Patient declined    Database administrator or Organizations: No    Attends Occupational hygienist Meetings: Not on file    Marital Status: Widowed  Intimate Partner Violence: Not At Risk (03/07/2022)   Humiliation, Afraid, Rape, and Kick questionnaire    Fear of Current or Ex-Partner: No    Emotionally Abused: No    Physically Abused: No    Sexually Abused: No    Family History  Problem Relation Age of Onset   Hypertension Mother    Heart disease Mother    Stroke Mother    Arthritis Mother    Heart attack Brother    Arthritis Other        maternal side RA cousin   Kidney disease Other        maternal side   Diabetes Other        1st degree relative, paternal side   Diabetes Father    Lung cancer Father     Current Outpatient Medications on File Prior to Visit  Medication Sig Dispense Refill   acyclovir (ZOVIRAX) 200 MG capsule Take 2 capsules (400 mg total) by mouth 3 (three) times daily for outbreaks 60 capsule 3   acyclovir ointment (ZOVIRAX) 5 % Apply 1 Application topically 3 (three) times daily as needed. 5 g 6   calcium carbonate (OS-CAL) 600 MG TABS Take 600 mg by mouth 3 (three) times daily with meals.     famotidine (PEPCID) 20 MG tablet Take 20 mg by mouth 2 (two) times daily.     hydrOXYzine (VISTARIL) 25 MG capsule Take 1 capsule (25 mg total) by mouth every 8 (eight) hours as needed (anxiety sleep.). 30 capsule 0   LORazepam (ATIVAN) 0.5 MG tablet Take 1-2 tablets (0.5-1 mg total) by mouth every 8 (eight) hours as needed for anxiety. 30 tablet 0   lovastatin (MEVACOR) 20 MG tablet Take 1 tablet (20 mg total) by mouth daily. 90 tablet 3   Magnesium 300 MG CAPS Take 1 capsule (300 mg total) by mouth daily. 30 capsule 3   Multiple Vitamin (MULTIVITAMIN) capsule Take 1 capsule by mouth daily.     Multiple Vitamins-Minerals (HAIR VITAMINS PO) Take by mouth.     VITAMIN D PO Take by mouth.     No current facility-administered medications on file prior to visit.    Allergies  Allergen Reactions   Sulfonamide Derivatives Nausea And Vomiting    Latex Rash       Physical Exam Vitals requested from patient and listed below if patient had equipment and was able to obtain at home for this virtual visit: There were no vitals filed for this  visit. Estimated body mass index is 23.28 kg/m as calculated from the following:   Height as of 03/11/23: 5\' 1"  (1.549 m).   Weight as of 03/11/23: 123 lb 3.2 oz (55.9 kg).  EKG (optional): deferred due to virtual visit  GENERAL: alert, oriented, no acute distress detected, full vision exam deferred due to pandemic and/or virtual encounter  HEENT: atraumatic, conjunttiva clear, no obvious abnormalities on inspection of external nose and ears  NECK: normal movements of the head and neck  LUNGS: on inspection no signs of respiratory distress, breathing rate appears normal, no obvious gross SOB, gasping or wheezing  CV: no obvious cyanosis  MS: moves all visible extremities without noticeable abnormality  PSYCH/NEURO: pleasant and cooperative, no obvious depression or anxiety, speech and thought processing grossly intact, Cognitive function grossly intact  Flowsheet Row Office Visit from 06/16/2022 in [facility] [place]  PHQ-9 Total Score 1           01/16/2023   11:55 AM 06/16/2022   10:43 AM 03/07/2022   12:45 PM 06/12/2021   11:33 AM 06/11/2020   11:23 AM  Depression screen PHQ 2/9  Decreased Interest 0 0 0 0 0  Down, Depressed, Hopeless 1 0 0 1 0  PHQ - 2 Score 1 0 0 1 0  Altered sleeping  1  1 0  Tired, decreased energy  0  0 0  Change in appetite  0  0 0  Feeling bad or failure about yourself   0  1 0  Trouble concentrating  0  0 0  Moving slowly or fidgety/restless  0  0 0  Suicidal thoughts  0  0 0  PHQ-9 Score  1  3 0  Difficult doing work/chores  Not difficult at all  Not difficult at all Not difficult at all  Playing dulcimer, staying really busy, travels, doing ok. Looking into counseling and plans to do if finds a counselor she likes.       08/12/2021   11:14 AM 03/07/2022   12:50 PM 06/16/2022   10:43 AM 01/16/2023   11:54 AM 03/10/2023    8:24 PM  Fall Risk  Falls in the past year? 1 0 0 1 0  Was there an injury with Fall? 0 0 0 1   Fall Risk Category Calculator 1 0 0 2   Fall Risk Category (Retired) Low Low Low    (RETIRED) Patient Fall Risk Level Low fall risk Low fall risk Low fall risk    Patient at Risk for Falls Due to  No Fall Risks No Fall Risks No Fall Risks   Fall risk Follow up   Falls prevention discussed Falls evaluation completed      SUMMARY AND PLAN:  Encounter for Medicare annual wellness exam   Discussed applicable health maintenance/preventive health measures and advised and referred or ordered per patient preferences: -abstracted cologuard -discussed vaccine booster recommendations and where to get  Health Maintenance  Topic Date Due   COVID-19 Vaccine (4 - 2023-24 season) 07/25/2022   INFLUENZA VACCINE  06/25/2023   DTaP/Tdap/Td (3 - Tdap) 04/14/2024   Medicare Annual Wellness (AWV)  06/08/2024   Fecal DNA (Cologuard)  06/27/2024   Pneumonia Vaccine 35+ Years old  Completed   DEXA SCAN  Completed   Hepatitis C Screening  Completed   Zoster Vaccines- Shingrix  Completed   HPV VACCINES  Aged Out   Colonoscopy  Discontinued   Education and counseling on the following  was provided based on the above review of health and a plan/checklist for the patient, along with additional information discussed, was provided for the patient in the patient instructions :   -Advised and counseled on a healthy lifestyle - including the importance of a healthy diet, regular physical activity, social connections and stress management. -Reviewed patient's current diet. A summary of a healthy diet was provided in the Patient Instructions.  -reviewed patient's current physical activity level and discussed exercise guidelines for adults. Provided community resources and ideas for safe exercise at home to assist in  meeting exercise guideline recommendations in a safe and healthy way.  -Advise yearly dental visits at minimum and regular eye exams -Advise no more than one alcoholic beverage per day  Follow up: see patient instructions     Patient Instructions  I really enjoyed getting to talk with you today! I am available on Tuesdays and Thursdays for virtual visits if you have any questions or concerns, or if I can be of any further assistance.   CHECKLIST FROM ANNUAL WELLNESS VISIT:  -Follow up (please call to schedule if not scheduled after visit):   -yearly for annual wellness visit with primary care office  Here is a list of your preventive care/health maintenance measures and the plan for each if any are due:  PLAN For any measures below that may be due:   Health Maintenance  Topic Date Due   COVID-19 Vaccine (4 - 2023-24 season) 07/25/2022   INFLUENZA VACCINE  06/25/2023   DTaP/Tdap/Td (3 - Tdap) 04/14/2024   Medicare Annual Wellness (AWV)  06/08/2024   Fecal DNA (Cologuard)  06/27/2024   Pneumonia Vaccine 48+ Years old  Completed   DEXA SCAN  Completed   Hepatitis C Screening  Completed   Zoster Vaccines- Shingrix  Completed   HPV VACCINES  Aged Out   Colonoscopy  Discontinued    -See a dentist at least yearly  -Get your eyes checked and then per your eye specialist's recommendations  -Other issues:  -let us know if you need any help in setting up counseling  -no more than one alcoholic beverage per day   -I have included below further information regarding a healthy whole foods based diet, physical activity guidelines for adults, stress management and opportunities for social connections. I hope you find this information useful.    -----------------------------------------------------------------------------------------------------------------------------------------------------------------------------------------------------------------------------------------------------------  NUTRITION: -eat real food: lots of colorful vegetables (half the plate) and fruits -5-7 servings of vegetables and fruits per day (fresh or steamed is best), exp. 2 servings of vegetables with lunch and dinner and 2 servings of fruit per day. Berries and greens such as kale and collards are great choices.  -consume on a regular basis: whole grains (make sure first ingredient on label contains the word "whole"), fresh fruits, fish, nuts, seeds, healthy oils (such as olive oil, avocado oil, grape seed oil) -may eat small amounts of dairy and lean meat on occasion, but avoid processed meats such as ham, bacon, lunch meat, etc. -drink water -try to avoid fast food and pre-packaged foods, processed meat -most experts advise limiting sodium to < 2300mg  per day, should limit further is any chronic conditions such as high blood pressure, heart disease, diabetes, etc. The American Heart Association advised that < 1500mg  is is ideal -try to avoid foods that contain any ingredients with names you do not recognize  -try to avoid sugar/sweets (except for the natural sugar that occurs in fresh fruit) -try to avoid sweet drinks -try  to avoid white rice, white bread, pasta (unless whole grain), white or yellow potatoes  EXERCISE GUIDELINES FOR ADULTS: -if you wish to increase your physical activity, do so gradually and with the approval of your doctor -STOP and seek medical care immediately if you have any chest pain, chest discomfort or trouble breathing when starting or increasing exercise  -move and stretch your body, legs, feet and arms when sitting for long periods -Physical activity guidelines for optimal health in adults: -least 150 minutes per week of  aerobic exercise (can talk, but not sing) once approved by your doctor, 20-30 minutes of sustained activity or two 10 minute episodes of sustained activity every day.  -resistance training at least 2 days per week if approved by your doctor -balance exercises 3+ days per week:   Stand somewhere where you have something sturdy to hold onto if you lose balance.    1) lift up on toes, start with 5x per day and work up to 20x   2) stand and lift on leg straight out to the side so that foot is a few inches of the floor, start with 5x each side and work up to 20x each side   3) stand on one foot, start with 5 seconds each side and work up to 20 seconds on each side  If you need ideas or help with getting more active:  -Silver sneakers https://tools.silversneakers.com  -Walk with a Doc: http://www.duncan-williams.com/  -try to include resistance (weight lifting/strength building) and balance exercises twice per week: or the following link for ideas: http://castillo-powell.com/  BuyDucts.dk  STRESS MANAGEMENT: -can try meditating, or just sitting quietly with deep breathing while intentionally relaxing all parts of your body for 5 minutes daily -if you need further help with stress, anxiety or depression please follow up with your primary doctor or contact the wonderful folks at [facility]: [phone]  SOCIAL CONNECTIONS: -options in [place] if you wish to engage in more social and exercise related activities:  -Silver sneakers https://tools.silversneakers.com  -Walk with a Doc: http://www.duncan-williams.com/  -Check out the [facility] Active Adults 50+ section on the [place] of [employer] (hiking clubs, book clubs, cards and games, chess, exercise classes, aquatic classes and much more) - see the website for  details: https://www.[place].gov/departments/parks-recreation/active-adults50  -YouTube has lots of exercise videos for different ages and abilities as well  -[facility] (a variety of indoor and outdoor inperson activities for adults). 5076890994. [address]  -Virtual Online Classes (a variety of topics): see seniorplanet.org or call (418)672-5258  -consider volunteering at a school, hospice center, church, senior center or elsewhere           Terressa Koyanagi, DO

## 2023-06-09 NOTE — Patient Instructions (Addendum)
I really enjoyed getting to talk with you today! I am available on Tuesdays and Thursdays for virtual visits if you have any questions or concerns, or if I can be of any further assistance.   CHECKLIST FROM ANNUAL WELLNESS VISIT:  -Follow up (please call to schedule if not scheduled after visit):   -yearly for annual wellness visit with primary care office  Here is a list of your preventive care/health maintenance measures and the plan for each if any are due:  PLAN For any measures below that may be due:   Health Maintenance  Topic Date Due   COVID-19 Vaccine (4 - 2023-24 season) 07/25/2022   INFLUENZA VACCINE  06/25/2023   DTaP/Tdap/Td (3 - Tdap) 04/14/2024   Medicare Annual Wellness (AWV)  06/08/2024   Fecal DNA (Cologuard)  06/27/2024   Pneumonia Vaccine 58+ Years old  Completed   DEXA SCAN  Completed   Hepatitis C Screening  Completed   Zoster Vaccines- Shingrix  Completed   HPV VACCINES  Aged Out   Colonoscopy  Discontinued    -See a dentist at least yearly  -Get your eyes checked and then per your eye specialist's recommendations  -Other issues:  -let us know if you need any help in setting up counseling  -no more than one alcoholic beverage per day   -I have included below further information regarding a healthy whole foods based diet, physical activity guidelines for adults, stress management and opportunities for social connections. I hope you find this information useful.   -----------------------------------------------------------------------------------------------------------------------------------------------------------------------------------------------------------------------------------------------------------  NUTRITION: -eat real food: lots of colorful vegetables (half the plate) and fruits -5-7 servings of vegetables and fruits per day (fresh or steamed is best), exp. 2 servings of vegetables with lunch and dinner and 2 servings of fruit per day.  Berries and greens such as kale and collards are great choices.  -consume on a regular basis: whole grains (make sure first ingredient on label contains the word "whole"), fresh fruits, fish, nuts, seeds, healthy oils (such as olive oil, avocado oil, grape seed oil) -may eat small amounts of dairy and lean meat on occasion, but avoid processed meats such as ham, bacon, lunch meat, etc. -drink water -try to avoid fast food and pre-packaged foods, processed meat -most experts advise limiting sodium to < 2300mg  per day, should limit further is any chronic conditions such as high blood pressure, heart disease, diabetes, etc. The American Heart Association advised that < 1500mg  is is ideal -try to avoid foods that contain any ingredients with names you do not recognize  -try to avoid sugar/sweets (except for the natural sugar that occurs in fresh fruit) -try to avoid sweet drinks -try to avoid white rice, white bread, pasta (unless whole grain), white or yellow potatoes  EXERCISE GUIDELINES FOR ADULTS: -if you wish to increase your physical activity, do so gradually and with the approval of your doctor -STOP and seek medical care immediately if you have any chest pain, chest discomfort or trouble breathing when starting or increasing exercise  -move and stretch your body, legs, feet and arms when sitting for long periods -Physical activity guidelines for optimal health in adults: -least 150 minutes per week of aerobic exercise (can talk, but not sing) once approved by your doctor, 20-30 minutes of sustained activity or two 10 minute episodes of sustained activity every day.  -resistance training at least 2 days per week if approved by your doctor -balance exercises 3+ days per week:   Stand somewhere where you  have something sturdy to hold onto if you lose balance.    1) lift up on toes, start with 5x per day and work up to 20x   2) stand and lift on leg straight out to the side so that foot is a few  inches of the floor, start with 5x each side and work up to 20x each side   3) stand on one foot, start with 5 seconds each side and work up to 20 seconds on each side  If you need ideas or help with getting more active:  -Silver sneakers https://tools.silversneakers.com  -Walk with a Doc: http://www.duncan-williams.com/  -try to include resistance (weight lifting/strength building) and balance exercises twice per week: or the following link for ideas: http://castillo-powell.com/  BuyDucts.dk  STRESS MANAGEMENT: -can try meditating, or just sitting quietly with deep breathing while intentionally relaxing all parts of your body for 5 minutes daily -if you need further help with stress, anxiety or depression please follow up with your primary doctor or contact the wonderful folks at [facility]: [phone]  SOCIAL CONNECTIONS: -options in [place] if you wish to engage in more social and exercise related activities:  -Silver sneakers https://tools.silversneakers.com  -Walk with a Doc: http://www.duncan-williams.com/  -Check out the [facility] Active Adults 50+ section on the [employer] (hiking clubs, book clubs, cards and games, chess, exercise classes, aquatic classes and much more) - see the website for details: https://www.[place].gov/departments/parks-recreation/active-adults50  -YouTube has lots of exercise videos for different ages and abilities as well  -[facility] (a variety of indoor and outdoor inperson activities for adults). [phone]. [address]  -Virtual Online Classes (a variety of topics): see seniorplanet.org or call [phone]  -consider volunteering at a school, hospice center, church, senior center or elsewhere

## 2023-06-12 DIAGNOSIS — M62838 Other muscle spasm: Secondary | ICD-10-CM | POA: Diagnosis not present

## 2023-06-23 NOTE — Progress Notes (Unsigned)
No chief complaint on file.   HPI: Patient  Nicole [name]  77 y.o. comes in today for Preventive Health Care visit   Health Maintenance  Topic Date Due   COVID-19 Vaccine (4 - 2023-24 season) 07/25/2022   INFLUENZA VACCINE  06/25/2023   DTaP/Tdap/Td (3 - Tdap) 04/14/2024   Medicare Annual Wellness (AWV)  06/08/2024   Fecal DNA (Cologuard)  06/27/2024   Pneumonia Vaccine 72+ Years old  Completed   DEXA SCAN  Completed   Hepatitis C Screening  Completed   Zoster Vaccines- Shingrix  Completed   HPV VACCINES  Aged Out   Colonoscopy  Discontinued   Health Maintenance Review LIFESTYLE:  Exercise:   Tobacco/ETS: Alcohol:  Sugar beverages: Sleep: Drug use: no HH of  Work:    ROS:  GEN/ HEENT: No fever, significant weight changes sweats headaches vision problems hearing changes, CV/ PULM; No chest pain shortness of breath cough, syncope,edema  change in exercise tolerance. GI /GU: No adominal pain, vomiting, change in bowel habits. No blood in the stool. No significant GU symptoms. SKIN/HEME: ,no acute skin rashes suspicious lesions or bleeding. No lymphadenopathy, nodules, masses.  NEURO/ PSYCH:  No neurologic signs such as weakness numbness. No depression anxiety. IMM/ Allergy: No unusual infections.  Allergy .   REST of 12 system review negative except as per HPI   Past Medical History:  Diagnosis Date   BRCA1 positive    Breast cancer (HCC)    Left- Ductal ca SR-, Chemo (Magrinat) rad rx    GERD (gastroesophageal reflux disease)    H/O migraine    HSV-2 (herpes simplex virus 2) infection    Hypercholesteremia    Rheumatoid factor positive    OA   had rheum consult   Scoliosis    Sprain of ankle 03/06/2015   revewiwed care exercise and fu if needed     Tinnitus     Past Surgical History:  Procedure Laterality Date   BREAST LUMPECTOMY     LAPAROSCOPIC BILATERAL SALPINGO OOPHERECTOMY  2006   Dr. Tresa Res   MASTECTOMY Bilateral 2006    Family History   Problem Relation Age of Onset   Hypertension Mother    Heart disease Mother    Stroke Mother    Arthritis Mother    Heart attack Brother    Arthritis Other        maternal side RA cousin   Kidney disease Other        maternal side   Diabetes Other        1st degree relative, paternal side   Diabetes Father    Lung cancer Father     Social History   Socioeconomic History   Marital status: Widowed    Spouse name: Not on file   Number of children: Not on file   Years of education: Not on file   Highest education level: Master's degree (e.g., MA, MS, MEng, MEd, MSW, MBA)  Occupational History   Not on file  Tobacco Use   Smoking status: Former   Smokeless tobacco: Never   Tobacco comments:    limited in college   Vaping Use   Vaping status: Never Used  Substance and Sexual Activity   Alcohol use: Yes    Alcohol/week: 7.0 standard drinks of alcohol    Types: 7 Glasses of wine per week   Drug use: No   Sexual activity: Not Currently    Birth control/protection: Post-menopausal  Other Topics Concern  Not on file  Social History Narrative   Married, 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 Designated Party release signed 04/17/10.      Husband  prostate cancer at this time. Passed  Nov 28 2020   hh of 1    Social Determinants of Health   Financial Resource Strain: Low Risk  (03/10/2023)   Overall Financial Resource Strain (CARDIA)    Difficulty of Paying Living Expenses: Not hard at all  Food Insecurity: No Food Insecurity (03/10/2023)   Hunger Vital Sign    Worried About Running Out of Food in the Last Year: Never true    Ran Out of Food in the Last Year: Never true  Transportation Needs: No Transportation Needs (03/10/2023)   PRAPARE - Administrator, Civil Service (Medical): No    Lack of Transportation (Non-Medical): No  Physical Activity: Sufficiently Active (03/10/2023)   Exercise Vital Sign    Days of Exercise  per Week: 5 days    Minutes of Exercise per Session: 40 min  Stress: Stress Concern Present (03/10/2023)   [employer] of Occupational Health - Occupational Stress Questionnaire    Feeling of Stress : Very much  Social Connections: Unknown (03/10/2023)   Social Connection and Isolation Panel [NHANES]    Frequency of Communication with Friends and Family: More than three times a week    Frequency of Social Gatherings with Friends and Family: More than three times a week    Attends Religious Services: Patient declined    Database administrator or Organizations: No    Attends Banker Meetings: Not on file    Marital Status: Widowed    Outpatient Medications Prior to Visit  Medication Sig Dispense Refill   acyclovir (ZOVIRAX) 200 MG capsule Take 2 capsules (400 mg total) by mouth 3 (three) times daily for outbreaks 60 capsule 3   acyclovir ointment (ZOVIRAX) 5 % Apply 1 Application topically 3 (three) times daily as needed. 5 g 6   calcium carbonate (OS-CAL) 600 MG TABS Take 600 mg by mouth 3 (three) times daily with meals.     famotidine (PEPCID) 20 MG tablet Take 20 mg by mouth 2 (two) times daily.     hydrOXYzine (VISTARIL) 25 MG capsule Take 1 capsule (25 mg total) by mouth every 8 (eight) hours as needed (anxiety sleep.). 30 capsule 0   LORazepam (ATIVAN) 0.5 MG tablet Take 1-2 tablets (0.5-1 mg total) by mouth every 8 (eight) hours as needed for anxiety. 30 tablet 0   lovastatin (MEVACOR) 20 MG tablet Take 1 tablet (20 mg total) by mouth daily. 90 tablet 3   Magnesium 300 MG CAPS Take 1 capsule (300 mg total) by mouth daily. 30 capsule 3   Multiple Vitamin (MULTIVITAMIN) capsule Take 1 capsule by mouth daily.     Multiple Vitamins-Minerals (HAIR VITAMINS PO) Take by mouth.     VITAMIN D PO Take by mouth.     No facility-administered medications prior to visit.     EXAM:  LMP 11/24/1997 (Approximate)   There is no height or weight on file to calculate BMI. Wt  Readings from Last 3 Encounters:  03/11/23 123 lb 3.2 oz (55.9 kg)  02/03/23 126 lb (57.2 kg)  01/16/23 125 lb 3.2 oz (56.8 kg)    Physical Exam: Vital signs reviewed ZOX:WRUE is a well-developed well-nourished alert cooperative    who appearsr stated age in no acute distress.  HEENT: normocephalic  atraumatic , Eyes: PERRL EOM's full, conjunctiva clear, Nares: paten,t no deformity discharge or tenderness., Ears: no deformity EAC's clear TMs with normal landmarks. Mouth: clear OP, no lesions, edema.  Moist mucous membranes. Dentition in adequate repair. NECK: supple without masses, thyromegaly or bruits. CHEST/PULM:  Clear to auscultation and percussion breath sounds equal no wheeze , rales or rhonchi. No chest wall deformities or tenderness. Breast: normal by inspection . No dimpling, discharge, masses, tenderness or discharge . CV: PMI is nondisplaced, S1 S2 no gallops, murmurs, rubs. Peripheral pulses are full without delay.No JVD .  ABDOMEN: Bowel sounds normal nontender  No guard or rebound, no hepato splenomegal no CVA tenderness.  No hernia. Extremtities:  No clubbing cyanosis or edema, no acute joint swelling or redness no focal atrophy NEURO:  Oriented x3, cranial nerves 3-12 appear to be intact, no obvious focal weakness,gait within normal limits no abnormal reflexes or asymmetrical SKIN: No acute rashes normal turgor, color, no bruising or petechiae. PSYCH: Oriented, good eye contact, no obvious depression anxiety, cognition and judgment appear normal. LN: no cervical axillary inguinal adenopathy  Lab Results  Component Value Date   WBC 4.8 06/16/2022   HGB 12.2 06/16/2022   HCT 36.9 06/16/2022   PLT 247.0 06/16/2022   GLUCOSE 88 06/16/2022   CHOL 178 06/16/2022   TRIG 102.0 06/16/2022   HDL 79.30 06/16/2022   LDLDIRECT 119.9 12/30/2012   LDLCALC 79 06/16/2022   ALT 16 06/16/2022   AST 21 06/16/2022   NA 140 06/16/2022   K 4.8 06/16/2022   CL 100 06/16/2022    CREATININE 0.85 06/16/2022   BUN 14 06/16/2022   CO2 33 (H) 06/16/2022   TSH 0.79 06/16/2022   HGBA1C 6.2 06/16/2022    BP Readings from Last 3 Encounters:  03/11/23 120/78  02/03/23 122/78  01/16/23 130/74    Lab update reviewed with patient   ASSESSMENT AND PLAN:  Discussed the following assessment and plan:    ICD-10-CM   1. Visit for preventive health examination  Z00.00     2. Medication management  Z79.899     3. Mixed hyperlipidemia  E78.2     4. Osteoporosis without current pathological fracture, unspecified osteoporosis type  M81.0      No follow-ups on file.  Patient Care Team: Basil Buffin, Neta Mends, MD as PCP - General Pollyann Savoy, MD (Rheumatology) Estill Bamberg, MD as Consulting Physician (Orthopedic Surgery) Mack Hook, MD as Consulting Physician (Orthopedic Surgery) Janet Berlin, MD as Consulting Physician (Ophthalmology) There are no Patient Instructions on file for this visit.  Neta Mends. Waynetta Metheny M.D.

## 2023-06-24 ENCOUNTER — Other Ambulatory Visit (HOSPITAL_COMMUNITY): Payer: Self-pay

## 2023-06-24 ENCOUNTER — Ambulatory Visit (INDEPENDENT_AMBULATORY_CARE_PROVIDER_SITE_OTHER): Payer: Medicare PPO | Admitting: Internal Medicine

## 2023-06-24 ENCOUNTER — Encounter: Payer: Self-pay | Admitting: Internal Medicine

## 2023-06-24 VITALS — BP 128/80 | HR 71 | Temp 97.9°F | Ht 61.0 in | Wt 122.6 lb

## 2023-06-24 DIAGNOSIS — Z1501 Genetic susceptibility to malignant neoplasm of breast: Secondary | ICD-10-CM

## 2023-06-24 DIAGNOSIS — F4323 Adjustment disorder with mixed anxiety and depressed mood: Secondary | ICD-10-CM

## 2023-06-24 DIAGNOSIS — Z79899 Other long term (current) drug therapy: Secondary | ICD-10-CM

## 2023-06-24 DIAGNOSIS — Z853 Personal history of malignant neoplasm of breast: Secondary | ICD-10-CM

## 2023-06-24 DIAGNOSIS — Z0000 Encounter for general adult medical examination without abnormal findings: Secondary | ICD-10-CM | POA: Diagnosis not present

## 2023-06-24 DIAGNOSIS — E782 Mixed hyperlipidemia: Secondary | ICD-10-CM

## 2023-06-24 DIAGNOSIS — R739 Hyperglycemia, unspecified: Secondary | ICD-10-CM

## 2023-06-24 DIAGNOSIS — Z1509 Genetic susceptibility to other malignant neoplasm: Secondary | ICD-10-CM

## 2023-06-24 DIAGNOSIS — M810 Age-related osteoporosis without current pathological fracture: Secondary | ICD-10-CM | POA: Diagnosis not present

## 2023-06-24 MED ORDER — HYDROXYZINE PAMOATE 25 MG PO CAPS
25.0000 mg | ORAL_CAPSULE | Freq: Three times a day (TID) | ORAL | 3 refills | Status: DC | PRN
  Filled 2023-06-24: qty 30, 10d supply, fill #0
  Filled 2023-10-13: qty 30, 10d supply, fill #1

## 2023-06-24 MED ORDER — LORAZEPAM 0.5 MG PO TABS
0.5000 mg | ORAL_TABLET | Freq: Three times a day (TID) | ORAL | 0 refills | Status: DC | PRN
  Filled 2023-06-24: qty 20, 6d supply, fill #0

## 2023-06-24 NOTE — Patient Instructions (Addendum)
Good to see you  Updated lab  make lab appt and fast  .  Use hydroxyzine first   caution with  lorazepam ( a benzo)  .   Continue lifestyle intervention healthy eating and exercise .  Keep appt with Endocrinologist about osteoporosis. Fu depending  6-12 months

## 2023-06-29 ENCOUNTER — Other Ambulatory Visit (HOSPITAL_COMMUNITY): Payer: Self-pay

## 2023-06-30 ENCOUNTER — Other Ambulatory Visit (HOSPITAL_COMMUNITY): Payer: Self-pay

## 2023-07-10 ENCOUNTER — Encounter: Payer: Self-pay | Admitting: Internal Medicine

## 2023-07-10 DIAGNOSIS — H9209 Otalgia, unspecified ear: Secondary | ICD-10-CM

## 2023-07-10 DIAGNOSIS — M542 Cervicalgia: Secondary | ICD-10-CM

## 2023-07-10 DIAGNOSIS — R6884 Jaw pain: Secondary | ICD-10-CM

## 2023-07-13 NOTE — Telephone Encounter (Signed)
Yes please do ent referral for above sx

## 2023-07-28 ENCOUNTER — Other Ambulatory Visit (INDEPENDENT_AMBULATORY_CARE_PROVIDER_SITE_OTHER): Payer: Medicare PPO

## 2023-07-28 DIAGNOSIS — M810 Age-related osteoporosis without current pathological fracture: Secondary | ICD-10-CM | POA: Diagnosis not present

## 2023-07-28 DIAGNOSIS — R739 Hyperglycemia, unspecified: Secondary | ICD-10-CM | POA: Diagnosis not present

## 2023-07-28 DIAGNOSIS — Z79899 Other long term (current) drug therapy: Secondary | ICD-10-CM

## 2023-07-28 DIAGNOSIS — E782 Mixed hyperlipidemia: Secondary | ICD-10-CM

## 2023-07-28 LAB — COMPREHENSIVE METABOLIC PANEL
ALT: 15 U/L (ref 0–35)
AST: 23 U/L (ref 0–37)
Albumin: 4.4 g/dL (ref 3.5–5.2)
Alkaline Phosphatase: 51 U/L (ref 39–117)
BUN: 17 mg/dL (ref 6–23)
CO2: 30 meq/L (ref 19–32)
Calcium: 10.1 mg/dL (ref 8.4–10.5)
Chloride: 100 meq/L (ref 96–112)
Creatinine, Ser: 0.93 mg/dL (ref 0.40–1.20)
GFR: 59.89 mL/min — ABNORMAL LOW (ref >60.00)
Glucose, Bld: 83 mg/dL (ref 70–99)
Potassium: 4.2 meq/L (ref 3.5–5.1)
Sodium: 138 meq/L (ref 135–145)
Total Bilirubin: 0.6 mg/dL (ref 0.2–1.2)
Total Protein: 7.2 g/dL (ref 6.0–8.3)

## 2023-07-28 LAB — CBC WITH DIFFERENTIAL/PLATELET
Basophils Absolute: 0 10*3/uL (ref 0.0–0.1)
Basophils Relative: 0.7 % (ref 0.0–3.0)
Eosinophils Absolute: 0.1 10*3/uL (ref 0.0–0.7)
Eosinophils Relative: 2.2 % (ref 0.0–5.0)
HCT: 37.1 % (ref 36.0–46.0)
Hemoglobin: 12.1 g/dL (ref 12.0–15.0)
Lymphocytes Relative: 22.7 % (ref 12.0–46.0)
Lymphs Abs: 1 10*3/uL (ref 0.7–4.0)
MCHC: 32.5 g/dL (ref 30.0–36.0)
MCV: 91.6 fl (ref 78.0–100.0)
Monocytes Absolute: 0.4 10*3/uL (ref 0.1–1.0)
Monocytes Relative: 8.8 % (ref 3.0–12.0)
Neutro Abs: 2.8 10*3/uL (ref 1.4–7.7)
Neutrophils Relative %: 65.6 % (ref 43.0–77.0)
Platelets: 227 10*3/uL (ref 150.0–400.0)
RBC: 4.05 Mil/uL (ref 3.87–5.11)
RDW: 13.7 % (ref 11.5–15.5)
WBC: 4.3 10*3/uL (ref 4.0–10.5)

## 2023-07-28 LAB — HEMOGLOBIN A1C: Hgb A1c MFr Bld: 5.9 % (ref 4.6–6.5)

## 2023-07-28 LAB — LIPID PANEL
Cholesterol: 196 mg/dL (ref 0–200)
HDL: 67.9 mg/dL (ref >39.00)
LDL Cholesterol: 110 mg/dL — ABNORMAL HIGH (ref 0–99)
NonHDL: 128.3
Total CHOL/HDL Ratio: 3
Triglycerides: 90 mg/dL (ref 0.0–149.0)
VLDL: 18 mg/dL (ref 0.0–40.0)

## 2023-07-29 LAB — TSH: TSH: 0.98 u[IU]/mL (ref 0.35–5.50)

## 2023-08-07 ENCOUNTER — Encounter: Payer: Self-pay | Admitting: Internal Medicine

## 2023-08-07 ENCOUNTER — Ambulatory Visit: Payer: Medicare PPO | Admitting: Internal Medicine

## 2023-08-07 ENCOUNTER — Other Ambulatory Visit (HOSPITAL_COMMUNITY): Payer: Self-pay

## 2023-08-07 ENCOUNTER — Other Ambulatory Visit: Payer: Self-pay

## 2023-08-07 VITALS — BP 136/80 | HR 73 | Ht 61.0 in | Wt 122.4 lb

## 2023-08-07 DIAGNOSIS — M810 Age-related osteoporosis without current pathological fracture: Secondary | ICD-10-CM

## 2023-08-07 DIAGNOSIS — R944 Abnormal results of kidney function studies: Secondary | ICD-10-CM

## 2023-08-07 NOTE — Patient Instructions (Addendum)
Please stop at the lab.  How Can I Prevent Falls? Men and women with osteoporosis need to take care not to fall down. Falls can break bones. Some reasons people fall are: Poor vision  Poor balance  Certain diseases that affect how you walk  Some types of medicine, such as sleeping pills.  Some tips to help prevent falls outdoors are: Use a cane or walker  Wear rubber-soled shoes so you don't slip  Walk on grass when sidewalks are slippery  In winter, put salt or kitty litter on icy sidewalks.  Some ways to help prevent falls indoors are: Keep rooms free of clutter, especially on floors  Use plastic or carpet runners on slippery floors  Wear low-heeled shoes that provide good support  Do not walk in socks, stockings, or slippers  Be sure carpets and area rugs have skid-proof backs or are tacked to the floor  Be sure stairs are well lit and have rails on both sides  Put grab bars on bathroom walls near tub, shower, and toilet  Use a rubber bath mat in the shower or tub  Keep a flashlight next to your bed  Use a sturdy step stool with a handrail and wide steps  Add more lights in rooms (and night lights) Buy a cordless phone to keep with you so that you don't have to rush to the phone       when it rings and so that you can call for help if you fall.   (adapted from http://www.niams.[employer].at)  Please check out the following book about best diet for bone health:   Exercise for Strong Bones (from [facility]) There are two types of exercises that are important for building and maintaining bone density:  weight-bearing and muscle-strengthening exercises. Weight-bearing Exercises These exercises include activities that make you move against gravity while staying upright. Weight-bearing exercises can be high-impact or low-impact. High-impact weight-bearing exercises help build bones and keep them strong. If you have  broken a bone due to osteoporosis or are at risk of breaking a bone, you may need to avoid high-impact exercises. If you're not sure, you should check with your healthcare provider. Examples of high-impact weight-bearing exercises are: Dancing Doing high-impact aerobics Hiking Jogging/running Jumping Rope Stair climbing Tennis Low-impact weight-bearing exercises can also help keep bones strong and are a safe alternative if you cannot do high-impact exercises. Examples of low-impact weight-bearing exercises are: Using elliptical training machines Doing low-impact aerobics Using stair-step machines Fast walking on a treadmill or outside Muscle-Strengthening Exercises These exercises include activities where you move your body, a weight or some other resistance against gravity. They are also known as resistance exercises and include: Lifting weights Using elastic exercise bands Using weight machines Lifting your own body weight Functional movements, such as standing and rising up on your toes Yoga and Pilates can also improve strength, balance and flexibility. However, certain positions may not be safe for people with osteoporosis or those at increased risk of broken bones. For example, exercises that have you bend forward may increase the chance of breaking a bone in the spine. A physical therapist should be able to help you learn which exercises are safe and appropriate for you. Non-Impact Exercises Non-impact exercises can help you to improve balance, posture and how well you move in everyday activities. These exercises can also help to increase muscle strength and decrease the risk of falls and broken bones. Some of these exercises include: Balance exercises that strengthen your  legs and test your balance, such as Tai Chi, can decrease your risk of falls. Posture exercises that improve your posture and reduce rounded or "sloping" shoulders can help you decrease the chance of breaking a  bone, especially in the spine. Functional exercises that improve how well you move can help you with everyday activities and decrease your chance of falling and breaking a bone. For example, if you have trouble getting up from a chair or climbing stairs, you should do these activities as exercises. A physical therapist can teach you balance, posture and functional exercises. Starting a New Exercise Program If you haven't exercised regularly for a while, check with your healthcare provider before beginning a new exercise program--particularly if you have health problems such as heart disease, diabetes or high blood pressure. If you're at high risk of breaking a bone, you should work with a physical therapist to develop a safe exercise program. Once you have your healthcare provider's approval, start slowly. If you've already broken bones in the spine because of osteoporosis, be very careful to avoid activities that require reaching down, bending forward, rapid twisting motions, heavy lifting and those that increase your chance of a fall. As you get started, your muscles may feel sore for a day or two after you exercise. If soreness lasts longer, you may be working too hard and need to ease up. Exercises should be done in a pain-free range of motion. How Much Exercise Do You Need? Weight-bearing exercises 30 minutes on most days of the week. Do a 30-minutesession or multiple sessions spread out throughout the day. The benefits to your bones are the same.   Muscle-strengthening exercises Two to three days per week. If you don't have much time for strengthening/resistance training, do small amounts at a time. You can do just one body part each day. For example do arms one day, legs the next and trunk the next. You can also spread these exercises out during your normal day.  Balance, posture and functional exercises Every day or as often as needed. You may want to focus on one area more than the others. If you  have fallen or lose your balance, spend time doing balance exercises. If you are getting rounded shoulders, work more on posture exercises. If you have trouble climbing stairs or getting up from the couch, do more functional exercises. You can also perform these exercises at one time or spread them during your day. Work with a phyiscal therapist to learn the right exercises for you.   Denosumab: Patient drug information (Up-to-date) Copyright (574)166-3680 [employer] All rights reserved.  Brand Names: [place]  ProliaRivka Barbara What is this drug used for?  It is used to treat soft, brittle bones (osteoporosis).  It is used for bone growth.  It is used when treating some cancers.  It may be given to you for other reasons. Talk with the doctor. What do I need to tell my doctor BEFORE I take this drug?  All products:  If you have an allergy to denosumab or any other part of this drug.  If you are allergic to any drugs like this one, any other drugs, foods, or other substances. Tell your doctor about the allergy and what signs you had, like rash; hives; itching; shortness of breath; wheezing; cough; swelling of face, lips, tongue, or throat; or any other signs.  If you have low calcium levels.  ProliaT:  If you are pregnant or may be pregnant. Do not take  this drug if you are pregnant.  This is not a list of all drugs or health problems that interact with this drug.  Tell your doctor and pharmacist about all of your drugs (prescription or OTC, natural products, vitamins) and health problems. You must check to make sure that it is safe for you to take this drug with all of your drugs and health problems. Do not start, stop, or change the dose of any drug without checking with your doctor. What are some things I need to know or do while I take this drug?  All products:  Tell dentists, surgeons, and other doctors that you use this drug.  This drug may raise the chance of a broken leg. Talk with your  doctor.  Have your blood work checked. Talk with your doctor.  Have a bone density test. Talk with your doctor.  Take calcium and vitamin D as you were told by your doctor.  Have a dental exam before starting this drug.  Take good care of your teeth. See a dentist often.  If you smoke, talk with your doctor.  Do not give to a child. Talk with your doctor.  Tell your doctor if you are breast-feeding. You will need to talk about any risks to your baby.  Rivka Barbara:  This drug may cause harm to the unborn baby if you take it while you are pregnant. If you get pregnant while taking this drug, call your doctor right away.  ProliaT:  Very bad infections have been reported with use of this drug. If you have any infection, are taking antibiotics now or in the recent past, or have many infections, talk with your doctor.  You may have more chance of getting an infection. Wash hands often. Stay away from people with infections, colds, or flu.  Use birth control that you can trust to prevent pregnancy while taking this drug.  If you are a man and your sex partner is pregnant or gets pregnant at any time while you are being treated, talk with your doctor. What are some side effects that I need to call my doctor about right away?  WARNING/CAUTION: Even though it may be rare, some people may have very bad and sometimes deadly side effects when taking a drug. Tell your doctor or get medical help right away if you have any of the following signs or symptoms that may be related to a very bad side effect:  All products:  Signs of an allergic reaction, like rash; hives; itching; red, swollen, blistered, or peeling skin with or without fever; wheezing; tightness in the chest or throat; trouble breathing or talking; unusual hoarseness; or swelling of the mouth, face, lips, tongue, or throat.  Signs of low calcium levels like muscle cramps or spasms, numbness and tingling, or seizures.  Mouth sores.  Any new or strange  groin, hip, or thigh pain.  This drug may cause jawbone problems. The chance may be higher the longer you take this drug. The chance may be higher if you have cancer, dental problems, dentures that do not fit well, anemia, blood clotting problems, or an infection. The chance may also be higher if you are having dental work or if you are getting chemo, some steroid drugs, or radiation. Call your doctor right away if you have jaw swelling or pain.  Xgeva:  Not hungry.  Muscle pain or weakness.  Seizures.  Shortness of breath.  ProliaT:  Signs of infection. These include a fever of  100.72F (38C) or higher, chills, very bad sore throat, ear or sinus pain, cough, more sputum or change in color of sputum, pain with passing urine, mouth sores, wound that will not heal, or anal itching or pain.  Signs of a pancreas problem (pancreatitis) like very bad stomach pain, very bad back pain, or very bad upset stomach or throwing up.  Chest pain.  A heartbeat that does not feel normal.  Very bad skin irritation.  Feeling very tired or weak.  Bladder pain or pain when passing urine or change in how much urine is passed.  Passing urine often.  Swelling in the arms or legs. What are some other side effects of this drug?  All drugs may cause side effects. However, many people have no side effects or only have minor side effects. Call your doctor or get medical help if any of these side effects or any other side effects bother you or do not go away:  Xgeva:  Feeling tired or weak.  Headache.  Upset stomach or throwing up.  Loose stools (diarrhea).  Cough.  ProliaT:  Back pain.  Muscle or joint pain.  Sore throat.  Runny nose.  Pain in arms or legs.  These are not all of the side effects that may occur. If you have questions about side effects, call your doctor. Call your doctor for medical advice about side effects.  You may report side effects to your national health agency. How is this drug best  taken?  Use this drug as ordered by your doctor. Read and follow the dosing on the label closely.  It is given as a shot into the fatty part of the skin. What do I do if I miss a dose?  Call the doctor to find out what to do. How do I store and/or throw out this drug?  This drug will be given to you in a hospital or doctor's office. You will not store it at home.  Keep all drugs out of the reach of children and pets.  Check with your pharmacist about how to throw out unused drugs.  General drug facts  If your symptoms or health problems do not get better or if they become worse, call your doctor.  Do not share your drugs with others and do not take anyone else's drugs.  Keep a list of all your drugs (prescription, natural products, vitamins, OTC) with you. Give this list to your doctor.  Talk with the doctor before starting any new drug, including prescription or OTC, natural products, or vitamins.  Some drugs may have another patient information leaflet. If you have any questions about this drug, please talk with your doctor, pharmacist, or other health care provider.  If you think there has been an overdose, call your poison control center or get medical care right away. Be ready to tell or show what was taken, how much, and when it happened.

## 2023-08-07 NOTE — Progress Notes (Signed)
Patient ID: Nicole [name], female   DOB: November 02, 1946, 77 y.o.   MRN: [id number]  HPI  Nicole [name] is a 77 y.o.-year-old female, referred by Dr. Terrilee Files, for management of osteoporosis (OP).  Pt was dx with OP in 12/2022, previously had osteopenia.  I reviewed pt's DXA scans: Date L1-L4 T score FN T score 33% distal Radius FRAX  01/21/2023 (mobile DXA) N/a RFN: n/a LFN: -2.0 -2.7   [facility] N/a RFN: n/a LFN: -1.4 -2.2 MOF: 10.5% Hip fracture: 1.7%  10/14/2016 [facility]) N/a RFN: -1.1 LFN: -1.4 -1.7 MOF: 9.5% Hip fracture: 1.7%   No dizziness/vertigo/orthostasis/poor vision.  She feels she has good balance.  Previous fractures: - heel fracture - jumped off a stage  - foot fracture (metatarsal) 01-Nov-2017 - metatarsal fracture fall 2022-11-01  Previous OP treatments:  - Fosamax Nov 01, 2000 or November 01, 2001 - for <1 year She went to a workshop for ONJ >> stopped Fosamax due to fear of side effects.  No h/o vitamin D deficiency. Reviewed available vit D levels: Lab Results  Component Value Date   VD25OH 49.35 03/11/2017   VD25OH 75 04/14/2014   Pt is on: - calcium 1800 mg daily previously >> now 1200 mg calcium + vitamin D - also usually takes a gummy multivitamin but has not taken this in few weeks.  Patient was exercising consistently but stopped at the time of her husband's death in Nov 01, 2021.  Afterwards, she restarted dancing.  In 02/2023, she went to her dancing workshop in [place] for 2 weeks-danced for 6 hours a day without problems.  However, she was also walking a lot and lost weight. Patient walks 3 to 4 miles a day, and also dances, is active working in her yard.  PCP advised her to do weight bearing exercises.   She does not take high vitamin A doses.  FH of osteoporosis: not known.  No h/o hyper/hypocalcemia or hyperparathyroidism. No h/o kidney stones. Lab Results  Component Value Date   CALCIUM 10.1 07/28/2023   CALCIUM 10.1 06/16/2022   CALCIUM 10.4  06/12/2021   CALCIUM 10.2 06/11/2020   CALCIUM 10.6 (H) 06/10/2019   CALCIUM 9.7 03/24/2018   CALCIUM 10.1 03/11/2017   CALCIUM 10.2 02/29/2016   CALCIUM 10.1 03/06/2015   CALCIUM 9.8 04/14/2014   No h/o thyrotoxicosis. Reviewed TSH recent levels:  Lab Results  Component Value Date   TSH 0.98 07/28/2023   TSH 0.79 06/16/2022   TSH 1.17 06/11/2020   TSH 1.28 03/24/2018   TSH 1.33 03/11/2017   No h/o CKD. Last BUN/Cr: Lab Results  Component Value Date   BUN 17 07/28/2023   CREATININE 0.93 07/28/2023   She also has a history of breast cancer, for which she had chemotherapy and radiation therapy  - 11/02/99.  Also, hyperlipidemia, prediabetes, GERD, positive rheumatoid factor .  ROS: + see above  I reviewed pt's medications, allergies, PMH, social hx, family hx, and changes were documented in the history of present illness. Otherwise, unchanged from my initial visit note.  Past Medical History:  Diagnosis Date   BRCA1 positive    Breast cancer (HCC)    Left- Ductal ca SR-, Chemo (Magrinat) rad rx    GERD (gastroesophageal reflux disease)    H/O migraine    HSV-2 (herpes simplex virus 2) infection    Hypercholesteremia    Rheumatoid factor positive    OA   had rheum consult   Scoliosis    Sprain of ankle 03/06/2015  revewiwed care exercise and fu if needed     Tinnitus    Past Surgical History:  Procedure Laterality Date   BREAST LUMPECTOMY     LAPAROSCOPIC BILATERAL SALPINGO OOPHERECTOMY  2006   Dr. Tresa Res   MASTECTOMY Bilateral 2006   Social History   Socioeconomic History   Marital status: Widowed    Spouse name: Not on file   Number of children: Not on file   Years of education: Not on file   Highest education level: Master's degree (e.g., MA, MS, MEng, MEd, MSW, MBA)  Occupational History   Not on file  Tobacco Use   Smoking status: Former   Smokeless tobacco: Never   Tobacco comments:    limited in college   Vaping Use   Vaping status: Never Used   Substance and Sexual Activity   Alcohol use: Yes    Alcohol/week: 7.0 standard drinks of alcohol    Types: 7 Glasses of wine per week   Drug use: No   Sexual activity: Not Currently    Birth control/protection: Post-menopausal  Other Topics Concern   Not on file  Social History Narrative   Married, 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 Designated Party release signed 04/17/10.      Husband  prostate cancer  Passed  Nov 28 2020   hh of 1    Social Determinants of Health   Financial Resource Strain: Low Risk  (03/10/2023)   Overall Financial Resource Strain (CARDIA)    Difficulty of Paying Living Expenses: Not hard at all  Food Insecurity: No Food Insecurity (03/10/2023)   Hunger Vital Sign    Worried About Running Out of Food in the Last Year: Never true    Ran Out of Food in the Last Year: Never true  Transportation Needs: No Transportation Needs (03/10/2023)   PRAPARE - Administrator, Civil Service (Medical): No    Lack of Transportation (Non-Medical): No  Physical Activity: Insufficiently Active (06/24/2023)   Exercise Vital Sign    Days of Exercise per Week: 3 days    Minutes of Exercise per Session: 20 min  Stress: Stress Concern Present (06/24/2023)   [employer] of Occupational Health - Occupational Stress Questionnaire    Feeling of Stress : Rather much  Social Connections: Moderately Isolated (06/24/2023)   Social Connection and Isolation Panel [NHANES]    Frequency of Communication with Friends and Family: More than three times a week    Frequency of Social Gatherings with Friends and Family: More than three times a week    Attends Religious Services: Never    Database administrator or Organizations: Yes    Attends Engineer, structural: More than 4 times per year    Marital Status: Widowed  Intimate Partner Violence: Not At Risk (03/07/2022)   Humiliation, Afraid, Rape, and Kick questionnaire    Fear of  Current or Ex-Partner: No    Emotionally Abused: No    Physically Abused: No    Sexually Abused: No   Current Outpatient Medications on File Prior to Visit  Medication Sig Dispense Refill   acyclovir (ZOVIRAX) 200 MG capsule Take 2 capsules (400 mg total) by mouth 3 (three) times daily for outbreaks (Patient taking differently: Take 400 mg by mouth 3 (three) times daily. As needed.) 60 capsule 3   acyclovir ointment (ZOVIRAX) 5 % Apply 1 Application topically 3 (three) times daily as needed. (Patient not taking: Reported on  06/24/2023) 5 g 6   calcium carbonate (OS-CAL) 600 MG TABS Take 600 mg by mouth in the morning and at bedtime.     famotidine (PEPCID) 20 MG tablet Take 20 mg by mouth as needed.     hydrOXYzine (VISTARIL) 25 MG capsule Take 1 capsule (25 mg total) by mouth every 8 (eight) hours as needed (anxiety sleep.). 30 capsule 3   LORazepam (ATIVAN) 0.5 MG tablet Take 1 - 2 tablets by mouth every 8 hours as needed for anxiety. 20 tablet 0   lovastatin (MEVACOR) 20 MG tablet Take 1 tablet (20 mg total) by mouth daily. 90 tablet 3   Magnesium 300 MG CAPS Take 1 capsule (300 mg total) by mouth daily. (Patient not taking: Reported on 06/24/2023) 30 capsule 3   Multiple Vitamin (MULTIVITAMIN) capsule Take 1 capsule by mouth daily.     Multiple Vitamins-Minerals (HAIR VITAMINS PO) Take by mouth.     VITAMIN D PO Take by mouth. (Patient not taking: Reported on 06/24/2023)     No current facility-administered medications on file prior to visit.   Allergies  Allergen Reactions   Sulfonamide Derivatives Nausea And Vomiting   Latex Rash   Family History  Problem Relation Age of Onset   Hypertension Mother    Heart disease Mother    Stroke Mother    Arthritis Mother    Heart attack Brother    Arthritis Other        maternal side RA cousin   Kidney disease Other        maternal side   Diabetes Other        1st degree relative, paternal side   Diabetes Father    Lung cancer Father     PE: BP 136/80   Pulse 73   Ht 5\' 1"  (1.549 m)   Wt 122 lb 6.4 oz (55.5 kg)   LMP 11/24/1997 (Approximate)   SpO2 99%   BMI 23.13 kg/m  Wt Readings from Last 3 Encounters:  08/07/23 122 lb 6.4 oz (55.5 kg)  06/24/23 122 lb 9.6 oz (55.6 kg)  03/11/23 123 lb 3.2 oz (55.9 kg)   Constitutional: normal weight, in NAD, no kyphosis Eyes:  EOMI, no exophthalmos ENT: no neck masses, no cervical lymphadenopathy Cardiovascular: RRR, No MRG Respiratory: CTA B Musculoskeletal: no deformities Skin:no rashes Neurological: no tremor with outstretched hands  Assessment: 1. Osteoporosis  Plan: 1. Osteoporosis - likely postmenopausal/age-related, she has no known FH of OP - Discussed about increased risk of fracture, depending on the T score, greatly increased when the T score is lower than -2.5, but it is actually a continuum and -2.5 should not be regarded as an absolute threshold. We reviewed her DXA scan report together, and I explained that based on the T scores, she has an increased risk for fractures. She has a small frame and it is possible that the T-scores are exaggerated due to the small size of her bones.  However, due to decrease in bone density is still worrisome.  - we reviewed her dietary and supplemental calcium and vitamin D intake. She gets 1200 mg of calcium from a calcium/vitamin D supplement + the amount in the multivitamin.  We discussed about trying to reduce the amount to total approximately 1500 mg from diet +/- supplements.  She does take vitamin D but she is not sure how many units a day.  I will check vit D today to see if she needs further supplementation. - discussed fall  precautions   - given handout from [facility] Re: weight bearing exercises - advised to do this every day or at least 5/7 days - I also recommended the [facility] - for skeletal loading. Given brochure and explained benefits. - She appears to be maintaining a good amount  of protein in her diet.  She eats dairy and does not have a restrictive diet.   - I recommended the following book for the concept of low acid eating:  - We discussed about the different medication classes, benefits and side effects (including atypical fractures and ONJ - no dental workup in progress or planned).  She was very worried about ONJ due to her history of chemotherapy and radiation therapy.  We discussed that these do not necessarily predispose her for ONJ.  I explained that we usually use bone agents in higher and more frequent doses in patients with cancer to prevent and limit bone metastasis, but this is not her case. -We reviewed treatment options: First options are sq denosumab (Prolia) sq every 6 months and zoledronic acid (Reclast) iv 1x a year or p.o. bisphosphonates.  Due to her history of fusion therapy, I would not recommend teriparatide/Abaloparatide (which are daily subcu medication).  I would reserve Romosozumab (monthly) as a last resort.  - Pt was given reading information about Prolia, and I explained the mechanism of action and expected benefits.  She would like to avoid medications if possible, however, she agrees to review the material and let me know if she wants to try Prolia. -At today's visit, we will check a vitamin D but I would also like to check her parathyroid hormone along with a calcium level due to the distinctive low bone mineral density at the distal radius, which is the site of action for PTH.  Also, she has had total calcium levels in the upper target range per review of her chart: Orders Placed This Encounter  Procedures   Vitamin D, 25-hydroxy   PTH, intact and calcium  -I would like to obtain a new DXA scan in 1 year after the previous, if covered by insurance.  We discussed that this needs to be on the same machine.  My preference would be to [facility] it at [facility].  She agrees with the plan. - will see pt back in a year  Office Visit on  08/07/2023  Component Date Value Ref Range Status   Vit D, 25-Hydroxy 08/07/2023 54.4  30.0 - 100.0 ng/mL Final   Comment: Vitamin D deficiency has been defined by the Institute of Medicine and an Endocrine Society practice guideline as a level of serum 25-OH vitamin D less than 20 ng/mL (1,2). The Endocrine Society went on to further define vitamin D insufficiency as a level between 21 and 29 ng/mL (2). 1. IOM (Institute of Medicine). 2010. Dietary reference    intakes for calcium and D. [place]: The    [employer]. 2. Holick MF, Binkley [place], Bischoff-Ferrari HA, et al.    Evaluation, treatment, and prevention of vitamin D    deficiency: an Endocrine Society clinical practice    guideline. JCEM. 2011 Jul; 96(7):1911-30.    Calcium 08/07/2023 CANCELED  mg/dL Final-Edited   Comment: Specimen quantity insufficient for verification by repeat analysis.  Result canceled by the ancillary.    PTH 08/07/2023 23  15 - 65 pg/mL Final   PTH Interp 08/07/2023 Comment   Final   Comment: Interpretation  Intact PTH    Calcium                                 (pg/mL)      (mg/dL) Normal                          15 - 65     8.6 - 10.2 Primary Hyperparathyroidism         >65          >10.2 Secondary Hyperparathyroidism       >65          <10.2 Non-Parathyroid Hypercalcemia       <65          >10.2 Hypoparathyroidism                  <15          < 8.6 Non-Parathyroid Hypocalcemia    15 - 65          < 8.6   Calcium was not run by the lab, however, vitamin D and PTH levels are excellent.  Carlus Pavlov, MD PhD [facility]

## 2023-08-14 LAB — PTH, INTACT AND CALCIUM: PTH: 23 pg/mL (ref 15–65)

## 2023-08-14 LAB — VITAMIN D 25 HYDROXY (VIT D DEFICIENCY, FRACTURES): Vit D, 25-Hydroxy: 54.4 ng/mL (ref 30.0–100.0)

## 2023-08-29 ENCOUNTER — Other Ambulatory Visit: Payer: Self-pay

## 2023-08-29 ENCOUNTER — Encounter (HOSPITAL_BASED_OUTPATIENT_CLINIC_OR_DEPARTMENT_OTHER): Payer: Self-pay

## 2023-08-29 ENCOUNTER — Emergency Department (HOSPITAL_BASED_OUTPATIENT_CLINIC_OR_DEPARTMENT_OTHER): Payer: Medicare PPO | Admitting: Radiology

## 2023-08-29 DIAGNOSIS — Z87891 Personal history of nicotine dependence: Secondary | ICD-10-CM | POA: Diagnosis not present

## 2023-08-29 DIAGNOSIS — Z853 Personal history of malignant neoplasm of breast: Secondary | ICD-10-CM | POA: Insufficient documentation

## 2023-08-29 DIAGNOSIS — M545 Low back pain, unspecified: Secondary | ICD-10-CM | POA: Diagnosis not present

## 2023-08-29 DIAGNOSIS — M5459 Other low back pain: Secondary | ICD-10-CM | POA: Diagnosis not present

## 2023-08-29 DIAGNOSIS — M549 Dorsalgia, unspecified: Secondary | ICD-10-CM | POA: Diagnosis not present

## 2023-08-29 NOTE — ED Triage Notes (Signed)
Pt BIB [facility] and presents with low back pain without radiation after working in her garden today.

## 2023-08-30 ENCOUNTER — Emergency Department (HOSPITAL_BASED_OUTPATIENT_CLINIC_OR_DEPARTMENT_OTHER)
Admission: EM | Admit: 2023-08-30 | Discharge: 2023-08-30 | Disposition: A | Discharge status: Home / Self Care | Payer: Medicare PPO | Attending: Emergency Medicine | Admitting: Emergency Medicine

## 2023-08-30 ENCOUNTER — Emergency Department (HOSPITAL_BASED_OUTPATIENT_CLINIC_OR_DEPARTMENT_OTHER): Payer: Medicare PPO | Admitting: Radiology

## 2023-08-30 ENCOUNTER — Telehealth (HOSPITAL_BASED_OUTPATIENT_CLINIC_OR_DEPARTMENT_OTHER): Payer: Self-pay | Admitting: Emergency Medicine

## 2023-08-30 ENCOUNTER — Other Ambulatory Visit (HOSPITAL_COMMUNITY): Payer: Self-pay

## 2023-08-30 DIAGNOSIS — M545 Low back pain, unspecified: Secondary | ICD-10-CM

## 2023-08-30 DIAGNOSIS — M48061 Spinal stenosis, lumbar region without neurogenic claudication: Secondary | ICD-10-CM | POA: Diagnosis not present

## 2023-08-30 DIAGNOSIS — M419 Scoliosis, unspecified: Secondary | ICD-10-CM | POA: Diagnosis not present

## 2023-08-30 DIAGNOSIS — M47816 Spondylosis without myelopathy or radiculopathy, lumbar region: Secondary | ICD-10-CM | POA: Diagnosis not present

## 2023-08-30 MED ORDER — DEXAMETHASONE 4 MG PO TABS
10.0000 mg | ORAL_TABLET | Freq: Once | ORAL | Status: AC
  Administered 2023-08-30: 10 mg via ORAL
  Filled 2023-08-30: qty 3

## 2023-08-30 MED ORDER — LIDOCAINE 5 % EX PTCH: 1.0000 | MEDICATED_PATCH | CUTANEOUS | 0 refills | Status: DC

## 2023-08-30 MED ORDER — LIDOCAINE 5 % EX PTCH
1.0000 | MEDICATED_PATCH | CUTANEOUS | 0 refills | Status: DC
  Filled 2023-08-30: qty 5, 5d supply, fill #0

## 2023-08-30 MED ORDER — METHOCARBAMOL 500 MG PO TABS
500.0000 mg | ORAL_TABLET | Freq: Three times a day (TID) | ORAL | 0 refills | Status: DC | PRN
  Filled 2023-08-30: qty 30, 10d supply, fill #0

## 2023-08-30 MED ORDER — METHOCARBAMOL 500 MG PO TABS: 500.0000 mg | ORAL_TABLET | Freq: Three times a day (TID) | ORAL | 0 refills | Status: DC | PRN

## 2023-08-30 NOTE — Discharge Instructions (Signed)
You were evaluated in the Emergency Department and after careful evaluation, we did not find any emergent condition requiring admission or further testing in the hospital.  Your exam/testing today is overall reassuring.  Symptoms likely due to muscle strain or spasm.  Continue Tylenol or ibuprofen every 4-6 hours at home for discomfort.  Can use the Robaxin muscle relaxer at night for more significant pain as we discussed.  Can also use the numbing patches daily.  Please return to the Emergency Department if you experience any worsening of your condition.   Thank you for allowing [place] to be a part of your care.

## 2023-08-30 NOTE — Telephone Encounter (Signed)
Patient's prescription [facility] which is closed.  She is requesting that the medications be sent to [facility] [address].  Prescriptions have been sent to that site at 12:20 PM.  Nursing staff requested to contact the patient.

## 2023-08-30 NOTE — ED Provider Notes (Signed)
[facility] Emergency Department Provider Note MRN:  [id number]  Arrival date & time: 08/30/23     Chief Complaint   Back Pain   History of Present Illness   Nicole [name] is a 77 y.o. year-old female with a history of scoliosis presenting to the ED with chief complaint of back pain.  Worked in the garden for quite a while earlier today, noticed some mild left lower back pain, got progressively worse through the night.  Worse with any movement.  No falls, no numbness or weakness to the arms or legs, no bowel or bladder dysfunction, no fever.  Review of Systems  A thorough review of systems was obtained and all systems are negative except as noted in the HPI and PMH.   Patient's Health History    Past Medical History:  Diagnosis Date   BRCA1 positive    Breast cancer (HCC)    Left- Ductal ca SR-, Chemo (Magrinat) rad rx    GERD (gastroesophageal reflux disease)    H/O migraine    HSV-2 (herpes simplex virus 2) infection    Hypercholesteremia    Rheumatoid factor positive    OA   had rheum consult   Scoliosis    Sprain of ankle 03/06/2015   revewiwed care exercise and fu if needed     Tinnitus     Past Surgical History:  Procedure Laterality Date   BREAST LUMPECTOMY     LAPAROSCOPIC BILATERAL SALPINGO OOPHERECTOMY  2006   Dr. Tresa Res   MASTECTOMY Bilateral 2006    Family History  Problem Relation Age of Onset   Hypertension Mother    Heart disease Mother    Stroke Mother    Arthritis Mother    Heart attack Brother    Arthritis Other        maternal side RA cousin   Kidney disease Other        maternal side   Diabetes Other        1st degree relative, paternal side   Diabetes Father    Lung cancer Father     Social History   Socioeconomic History   Marital status: Widowed    Spouse name: Not on file   Number of children: Not on file   Years of education: Not on file   Highest education level: Master's degree (e.g., MA, MS,  MEng, MEd, MSW, MBA)  Occupational History   Not on file  Tobacco Use   Smoking status: Former   Smokeless tobacco: Never   Tobacco comments:    limited in college   Vaping Use   Vaping status: Never Used  Substance and Sexual Activity   Alcohol use: Yes    Alcohol/week: 7.0 standard drinks of alcohol    Types: 7 Glasses of wine per week   Drug use: No   Sexual activity: Not Currently    Birth control/protection: Post-menopausal  Other Topics Concern   Not on file  Social History Narrative   Married, 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 Designated Party release signed 04/17/10.      Husband  prostate cancer  Passed  Nov 28 2020   hh of 1    Social Determinants of Health   Financial Resource Strain: Low Risk  (03/10/2023)   Overall Financial Resource Strain (CARDIA)    Difficulty of Paying Living Expenses: Not hard at all  Food Insecurity: No Food Insecurity (03/10/2023)   Hunger Vital Sign  Worried About Programme researcher, broadcasting/film/video in the Last Year: Never true    Ran Out of Food in the Last Year: Never true  Transportation Needs: No Transportation Needs (03/10/2023)   PRAPARE - Administrator, Civil Service (Medical): No    Lack of Transportation (Non-Medical): No  Physical Activity: Insufficiently Active (06/24/2023)   Exercise Vital Sign    Days of Exercise per Week: 3 days    Minutes of Exercise per Session: 20 min  Stress: Stress Concern Present (06/24/2023)   [employer] of Occupational Health - Occupational Stress Questionnaire    Feeling of Stress : Rather much  Social Connections: Moderately Isolated (06/24/2023)   Social Connection and Isolation Panel [NHANES]    Frequency of Communication with Friends and Family: More than three times a week    Frequency of Social Gatherings with Friends and Family: More than three times a week    Attends Religious Services: Never    Database administrator or Organizations: Yes     Attends Engineer, structural: More than 4 times per year    Marital Status: Widowed  Intimate Partner Violence: Not At Risk (03/07/2022)   Humiliation, Afraid, Rape, and Kick questionnaire    Fear of Current or Ex-Partner: No    Emotionally Abused: No    Physically Abused: No    Sexually Abused: No     Physical Exam   Vitals:   08/29/23 2158 08/30/23 0202  BP: (!) 148/79 (!) 143/63  Pulse: 86 76  Resp: 15 18  Temp: 97.8 F (36.6 C)   SpO2: 100% 99%    CONSTITUTIONAL: Well-appearing, NAD NEURO/PSYCH:  Alert and oriented x 3, no focal deficits EYES:  eyes equal and reactive ENT/NECK:  no LAD, no JVD CARDIO: Regular rate, well-perfused, normal S1 and S2 PULM:  CTAB no wheezing or rhonchi GI/GU:  non-distended, non-tender MSK/SPINE:  No gross deformities, no edema SKIN:  no rash, atraumatic   *Additional and/or pertinent findings included in MDM below  Diagnostic and Interventional Summary    EKG Interpretation Date/Time:    Ventricular Rate:    PR Interval:    QRS Duration:    QT Interval:    QTC Calculation:   R Axis:      Text Interpretation:         Labs Reviewed - No data to display  DG Lumbar Spine Complete  Final Result      Medications  dexamethasone (DECADRON) tablet 10 mg (10 mg Oral Given 08/30/23 0214)     Procedures  /  Critical Care Procedures  ED Course and Medical Decision Making  Initial Impression and Ddx X-ray to exclude compression fracture given patient's age, history of RA, history of scoliosis.  Otherwise favoring muscle strain or spasm.  No signs or symptoms of myelopathy.  Past medical/surgical history that increases complexity of ED encounter: None  Interpretation of Diagnostics I personally reviewed the lumbar x-ray and my interpretation is as follows: No acute fracture    Patient Reassessment and Ultimate Disposition/Management     Discharge  Patient management required discussion with the following services  or consulting groups:  None  Complexity of Problems Addressed Acute complicated illness or Injury  Additional Data Reviewed and Analyzed Further history obtained from: None  Additional Factors Impacting ED Encounter Risk Prescriptions  Elmer Sow. Pilar Plate, MD [facility] [email or web address]  Final Clinical Impressions(s) / ED Diagnoses     ICD-10-CM  1. Acute left-sided low back pain without sciatica  M54.50       ED Discharge Orders          Ordered    lidocaine (LIDODERM) 5 %  Every 24 hours        08/30/23 0217    methocarbamol (ROBAXIN) 500 MG tablet  Every 8 hours PRN        08/30/23 0217             Discharge Instructions Discussed with and Provided to Patient:    Discharge Instructions      You were evaluated in the Emergency Department and after careful evaluation, we did not find any emergent condition requiring admission or further testing in the hospital.  Your exam/testing today is overall reassuring.  Symptoms likely due to muscle strain or spasm.  Continue Tylenol or ibuprofen every 4-6 hours at home for discomfort.  Can use the Robaxin muscle relaxer at night for more significant pain as we discussed.  Can also use the numbing patches daily.  Please return to the Emergency Department if you experience any worsening of your condition.   Thank you for allowing [place] to be a part of your care.      Sabas Sous, MD 08/30/23 [phone]

## 2023-08-31 ENCOUNTER — Other Ambulatory Visit (HOSPITAL_COMMUNITY): Payer: Self-pay

## 2023-09-01 ENCOUNTER — Other Ambulatory Visit (HOSPITAL_COMMUNITY): Payer: Self-pay

## 2023-09-01 DIAGNOSIS — M26623 Arthralgia of bilateral temporomandibular joint: Secondary | ICD-10-CM | POA: Diagnosis not present

## 2023-09-01 DIAGNOSIS — R1314 Dysphagia, pharyngoesophageal phase: Secondary | ICD-10-CM | POA: Diagnosis not present

## 2023-09-01 DIAGNOSIS — K219 Gastro-esophageal reflux disease without esophagitis: Secondary | ICD-10-CM | POA: Diagnosis not present

## 2023-09-01 MED ORDER — IPRATROPIUM BROMIDE 0.03 % NA SOLN
2.0000 | Freq: Two times a day (BID) | NASAL | 1 refills | Status: AC
  Filled 2023-09-01: qty 30, 75d supply, fill #0
  Filled 2023-09-16: qty 30, 30d supply, fill #0

## 2023-09-02 ENCOUNTER — Other Ambulatory Visit: Payer: Self-pay | Admitting: Otolaryngology

## 2023-09-02 DIAGNOSIS — M26629 Arthralgia of temporomandibular joint, unspecified side: Secondary | ICD-10-CM

## 2023-09-02 DIAGNOSIS — K219 Gastro-esophageal reflux disease without esophagitis: Secondary | ICD-10-CM

## 2023-09-02 DIAGNOSIS — R1314 Dysphagia, pharyngoesophageal phase: Secondary | ICD-10-CM

## 2023-09-11 ENCOUNTER — Other Ambulatory Visit (HOSPITAL_COMMUNITY): Payer: Self-pay

## 2023-09-16 ENCOUNTER — Other Ambulatory Visit (HOSPITAL_COMMUNITY): Payer: Self-pay

## 2023-09-18 ENCOUNTER — Other Ambulatory Visit (HOSPITAL_COMMUNITY): Payer: Self-pay

## 2023-09-22 ENCOUNTER — Other Ambulatory Visit: Payer: Self-pay

## 2023-09-22 ENCOUNTER — Encounter: Payer: Self-pay | Admitting: Internal Medicine

## 2023-09-22 ENCOUNTER — Other Ambulatory Visit (HOSPITAL_COMMUNITY): Payer: Self-pay

## 2023-09-22 MED ORDER — ACYCLOVIR 5 % EX OINT
1.0000 | TOPICAL_OINTMENT | Freq: Three times a day (TID) | CUTANEOUS | 6 refills | Status: AC | PRN
  Filled 2023-09-22: qty 5, 2d supply, fill #0
  Filled 2024-07-19: qty 15, 5d supply, fill #0

## 2023-09-25 ENCOUNTER — Encounter: Payer: Self-pay | Admitting: Family Medicine

## 2023-09-25 ENCOUNTER — Ambulatory Visit: Payer: Medicare PPO | Admitting: Family Medicine

## 2023-09-25 ENCOUNTER — Other Ambulatory Visit (HOSPITAL_COMMUNITY): Payer: Self-pay

## 2023-09-25 VITALS — BP 126/80 | HR 76 | Temp 98.6°F | Wt 123.6 lb

## 2023-09-25 DIAGNOSIS — J019 Acute sinusitis, unspecified: Secondary | ICD-10-CM

## 2023-09-25 MED ORDER — AZITHROMYCIN 250 MG PO TABS
ORAL_TABLET | ORAL | 0 refills | Status: AC
  Filled 2023-09-25: qty 6, 5d supply, fill #0

## 2023-09-25 MED ORDER — METHYLPREDNISOLONE 4 MG PO TBPK
ORAL_TABLET | ORAL | 0 refills | Status: DC
  Filled 2023-09-25: qty 21, 6d supply, fill #0

## 2023-09-25 NOTE — Progress Notes (Signed)
   Subjective:    Patient ID: Nicole [name], female    DOB: October 24, 1946, 77 y.o.   MRN: [id number]  HPI Here for 2 weeks of sinus pressure, HA, ear pain, and PND. No cough or fever.    Review of Systems  Constitutional: Negative.   HENT:  Positive for congestion, ear pain, postnasal drip and sinus pressure. Negative for sore throat.   Eyes: Negative.   Respiratory: Negative.         Objective:   Physical Exam Constitutional:      Appearance: She is ill-appearing.  HENT:     Right Ear: Tympanic membrane, ear canal and external ear normal.     Left Ear: Tympanic membrane, ear canal and external ear normal.     Nose: Nose normal.     Mouth/Throat:     Pharynx: Oropharynx is clear.  Eyes:     Conjunctiva/sclera: Conjunctivae normal.  Pulmonary:     Effort: Pulmonary effort is normal.     Breath sounds: Normal breath sounds.  Lymphadenopathy:     Cervical: No cervical adenopathy.  Neurological:     Mental Status: She is alert.           Assessment & Plan:  Sinusitis, treat with a Zpack and a Medrol dose pack.  Gershon Crane, MD

## 2023-09-28 DIAGNOSIS — R030 Elevated blood-pressure reading, without diagnosis of hypertension: Secondary | ICD-10-CM | POA: Diagnosis not present

## 2023-09-28 DIAGNOSIS — M810 Age-related osteoporosis without current pathological fracture: Secondary | ICD-10-CM | POA: Diagnosis not present

## 2023-09-28 DIAGNOSIS — E785 Hyperlipidemia, unspecified: Secondary | ICD-10-CM | POA: Diagnosis not present

## 2023-09-28 DIAGNOSIS — M419 Scoliosis, unspecified: Secondary | ICD-10-CM | POA: Diagnosis not present

## 2023-09-28 DIAGNOSIS — K219 Gastro-esophageal reflux disease without esophagitis: Secondary | ICD-10-CM | POA: Diagnosis not present

## 2023-09-28 DIAGNOSIS — Z87891 Personal history of nicotine dependence: Secondary | ICD-10-CM | POA: Diagnosis not present

## 2023-09-28 DIAGNOSIS — M1990 Unspecified osteoarthritis, unspecified site: Secondary | ICD-10-CM | POA: Diagnosis not present

## 2023-09-28 DIAGNOSIS — M4800 Spinal stenosis, site unspecified: Secondary | ICD-10-CM | POA: Diagnosis not present

## 2023-09-28 DIAGNOSIS — B009 Herpesviral infection, unspecified: Secondary | ICD-10-CM | POA: Diagnosis not present

## 2023-09-30 ENCOUNTER — Telehealth: Payer: Self-pay

## 2023-09-30 NOTE — Telephone Encounter (Signed)
Transition Care Management Unsuccessful Follow-up Telephone Call  Date of discharge and from where:  [facility] 10/6  Attempts:  1st Attempt  Reason for unsuccessful TCM follow-up call:  No answer/busy   [name]  Nicole [name], Nicole [name] Guide, Phone: [phone] Website: Nicole [name]

## 2023-10-01 ENCOUNTER — Telehealth: Payer: Self-pay

## 2023-10-01 NOTE — Telephone Encounter (Signed)
Transition Care Management Unsuccessful Follow-up Telephone Call  Date of discharge and from where:  [facility]  Attempts:  2nd Attempt  Reason for unsuccessful TCM follow-up call:  No answer/busy   [facility]  [facility], [facility] Guide, Phone: [phone] Website: [email or web address]

## 2023-10-02 ENCOUNTER — Encounter: Payer: Self-pay | Admitting: Family Medicine

## 2023-10-02 ENCOUNTER — Ambulatory Visit: Payer: Medicare PPO | Admitting: Family Medicine

## 2023-10-02 ENCOUNTER — Telehealth: Payer: Self-pay | Admitting: Internal Medicine

## 2023-10-02 VITALS — BP 136/80 | HR 78 | Temp 97.9°F | Ht 61.0 in | Wt 122.4 lb

## 2023-10-02 DIAGNOSIS — M79671 Pain in right foot: Secondary | ICD-10-CM | POA: Diagnosis not present

## 2023-10-02 DIAGNOSIS — M79604 Pain in right leg: Secondary | ICD-10-CM | POA: Diagnosis not present

## 2023-10-02 DIAGNOSIS — I872 Venous insufficiency (chronic) (peripheral): Secondary | ICD-10-CM

## 2023-10-02 DIAGNOSIS — I739 Peripheral vascular disease, unspecified: Secondary | ICD-10-CM | POA: Diagnosis not present

## 2023-10-02 NOTE — Patient Instructions (Addendum)
Orders for an ultrasound and blood work were placed.

## 2023-10-02 NOTE — Telephone Encounter (Signed)
error 

## 2023-10-02 NOTE — Progress Notes (Signed)
Established Patient Office Visit   Subjective  Patient ID: Nicole [name], female    DOB: [birth year]  Age: 77 y.o. MRN: [id number]  Chief Complaint  Patient presents with   Leg Pain    Pt c/o leg pain on Right side. Home health did PAD test at home last week. Woke up with R foot pain this morning. Reports family hx of stroke.     Patient is a 76 year old female followed with Dr. Fabian Sharp and seen for acute concern.  Patient had PAD testing last week at home.  Pt woke up last night with R anterior lower leg pain and this morning with right foot pain.  Pt denies edema, erythema, injury.  Patient has been gardening in the last few weeks.  Trying to be more active s/p the passing of her husband 2 yrs ago.  Patient concerned about CVA due to family history.  In the past had sclerotherapy by dermatology for spider veins.  Notes intermittent right foot and toe pain 2/2 history of fracture.    Patient Active Problem List   Diagnosis Date Noted   Osteoporosis without current pathological fracture 08/07/2023   Fracture of 5th metatarsal 09/12/2022   Anterior chest wall pain 06/17/2022   Degenerative arthritis of right knee 03/20/2022   Tear of gluteus minimus tendon, right, initial encounter 02/06/2022   Greater trochanteric bursitis of right hip 01/02/2022   Acute lateral meniscal tear, left, initial encounter 06/14/2019   Tear of LCL (lateral collateral ligament) of knee, left, initial encounter 06/14/2019   BRCA1 positive    Vitamin D deficiency 04/14/2014   Osteopenia 04/14/2014   Encounter for preventive health examination 10/08/2011   Rheumatoid factor positive    Chest pain at rest 06/04/2011   Heart palpitations 06/04/2011   Herpes simplex virus (HSV) infection 08/14/2009   Arthropathy of hand 05/17/2009   Venous (peripheral) insufficiency 06/12/2008   HYPERLIPIDEMIA 10/13/2007   GERD 10/13/2007   Arthropathy, multiple sites 10/13/2007   BACK PAIN, LUMBAR 10/13/2007   Disorder  of bone and cartilage 10/13/2007   Idiopathic scoliosis and kyphoscoliosis 10/13/2007   Headache 10/13/2007   ELEVATED BP READING WITHOUT DX HYPERTENSION 10/13/2007   Personal history of malignant neoplasm of breast 10/13/2007   Past Medical History:  Diagnosis Date   BRCA1 positive    Breast cancer (HCC)    Left- Ductal ca SR-, Chemo (Magrinat) rad rx    GERD (gastroesophageal reflux disease)    H/O migraine    HSV-2 (herpes simplex virus 2) infection    Hypercholesteremia    Rheumatoid factor positive    OA   had rheum consult   Scoliosis    Sprain of ankle 03/06/2015   revewiwed care exercise and fu if needed     Tinnitus    Past Surgical History:  Procedure Laterality Date   BREAST LUMPECTOMY     LAPAROSCOPIC BILATERAL SALPINGO OOPHERECTOMY  2006   Dr. Tresa Res   MASTECTOMY Bilateral 2006   Social History   Tobacco Use   Smoking status: Former   Smokeless tobacco: Never   Tobacco comments:    limited in college   Vaping Use   Vaping status: Never Used  Substance Use Topics   Alcohol use: Yes    Alcohol/week: 7.0 standard drinks of alcohol    Types: 7 Glasses of wine per week   Drug use: No   Family History  Problem Relation Age of Onset   Hypertension Mother    Heart disease  Mother    Stroke Mother    Arthritis Mother    Heart attack Brother    Arthritis Other        maternal side RA cousin   Kidney disease Other        maternal side   Diabetes Other        1st degree relative, paternal side   Diabetes Father    Lung cancer Father    Allergies  Allergen Reactions   Sulfonamide Derivatives Nausea And Vomiting   Latex Rash      ROS Negative unless stated above    Objective:     BP (!) 140/78 (BP Location: Right Arm, Patient Position: Sitting, Cuff Size: Normal)   Pulse 78   Temp 97.9 F (36.6 C) (Oral)   Ht 5\' 1"  (1.549 m)   Wt 122 lb 6.4 oz (55.5 kg)   LMP 11/24/1997 (Approximate)   SpO2 97%   BMI 23.13 kg/m   Recheck 136/80. BP  Readings from Last 3 Encounters:  10/02/23 (!) 140/78  09/25/23 126/80  08/30/23 (!) 143/63   Wt Readings from Last 3 Encounters:  10/02/23 122 lb 6.4 oz (55.5 kg)  09/25/23 123 lb 9.6 oz (56.1 kg)  08/29/23 123 lb (55.8 kg)        Physical Exam Constitutional:      Appearance: Normal appearance.  HENT:     Head: Normocephalic and atraumatic.     Nose: Nose normal.     Mouth/Throat:     Mouth: Mucous membranes are moist.  Eyes:     Extraocular Movements: Extraocular movements intact.     Conjunctiva/sclera: Conjunctivae normal.  Cardiovascular:     Rate and Rhythm: Normal rate and regular rhythm.     Pulses:          Dorsalis pedis pulses are 2+ on the right side and 2+ on the left side.       Posterior tibial pulses are 2+ on the right side and 2+ on the left side.     Comments: Bilateral LEs warm without discoloration. Pulmonary:     Effort: Pulmonary effort is normal.     Breath sounds: Normal breath sounds.  Musculoskeletal:     Comments: No edema, erythema, induration of bilateral LEs.  Spider veins on bilateral LEs.  No TTP of bilateral calfs.  Negative Homans sign.  Skin:    General: Skin is warm and dry.     Comments: Spider veins present.  Neurological:     Mental Status: She is alert and oriented to person, place, and time.      No results found for any visits on 10/02/23.    Assessment & Plan:  Right foot pain -     D-dimer, quantitative -     VAS [place] LOWER EXTREMITY VENOUS (DVT); Future  Pain of right anterior lower extremity -     D-dimer, quantitative -     VAS [place] LOWER EXTREMITY VENOUS (DVT); Future  Venous (peripheral) insufficiency -     D-dimer, quantitative -     VAS [place] LOWER EXTREMITY VENOUS (DVT); Future  PAD (peripheral artery disease) (HCC) -     D-dimer, quantitative -     VAS [place] LOWER EXTREMITY VENOUS (DVT); Future  Patient with acute right foot and anterior lower leg pain x 1 day.  Symptoms likely 2/2 h/o  venous insufficiency  and PAD.  Also consider arthritis due to history of right foot fracture.  Obtain D-dimer and Doppler  of bilateral LEs to rule out DVT.  Discussed compression socks and other supportive care.  Continue optimizing medical management for lipid control and BP control.  Consider referral to vascular for continued or worsening symptoms.  Return if symptoms worsen or fail to improve.   Deeann Saint, MD

## 2023-10-03 LAB — D-DIMER, QUANTITATIVE: D-Dimer, Quant: 0.2 ug{FEU}/mL (ref <0.50)

## 2023-10-05 ENCOUNTER — Ambulatory Visit (HOSPITAL_COMMUNITY)
Admission: RE | Admit: 2023-10-05 | Discharge: 2023-10-05 | Disposition: A | Discharge status: Home / Self Care | Payer: Medicare PPO | Source: Ambulatory Visit | Attending: Family Medicine | Admitting: Family Medicine

## 2023-10-05 DIAGNOSIS — I739 Peripheral vascular disease, unspecified: Secondary | ICD-10-CM | POA: Diagnosis not present

## 2023-10-05 DIAGNOSIS — M79604 Pain in right leg: Secondary | ICD-10-CM | POA: Diagnosis not present

## 2023-10-05 DIAGNOSIS — I872 Venous insufficiency (chronic) (peripheral): Secondary | ICD-10-CM | POA: Diagnosis not present

## 2023-10-05 DIAGNOSIS — M79671 Pain in right foot: Secondary | ICD-10-CM | POA: Diagnosis not present

## 2023-10-06 ENCOUNTER — Other Ambulatory Visit: Payer: Medicare PPO

## 2023-10-12 ENCOUNTER — Other Ambulatory Visit (HOSPITAL_COMMUNITY): Payer: Self-pay

## 2023-10-12 ENCOUNTER — Other Ambulatory Visit: Payer: Self-pay | Admitting: Internal Medicine

## 2023-10-12 MED ORDER — LOVASTATIN 20 MG PO TABS
20.0000 mg | ORAL_TABLET | Freq: Every day | ORAL | 3 refills | Status: DC
  Filled 2023-10-12: qty 90, 90d supply, fill #0
  Filled 2024-01-27: qty 90, 90d supply, fill #1
  Filled 2024-05-30: qty 90, 90d supply, fill #2
  Filled 2024-09-20: qty 90, 90d supply, fill #3

## 2023-10-14 ENCOUNTER — Other Ambulatory Visit (HOSPITAL_COMMUNITY): Payer: Self-pay

## 2023-12-04 ENCOUNTER — Other Ambulatory Visit (HOSPITAL_COMMUNITY): Payer: Self-pay

## 2023-12-29 ENCOUNTER — Ambulatory Visit: Payer: Medicare PPO | Admitting: Family Medicine

## 2024-01-04 ENCOUNTER — Telehealth: Payer: Self-pay | Admitting: Internal Medicine

## 2024-01-04 NOTE — Telephone Encounter (Signed)
 Patient is calling back about the lab work that was done in September but one of the test were not performed. How would you like to proceed?

## 2024-01-04 NOTE — Telephone Encounter (Signed)
 We can wait until next visit.   The labs run at that time were normal.  Calcium was not run at the time of the visit, but she had a calcium level from few days prior and this was normal.

## 2024-01-04 NOTE — Telephone Encounter (Signed)
 Patient is calling to say that she spoke with someone about her lab results after her last visit and she was told that they did not have enough blood to do all the lab tests requested by Dr. Aldona Amel.  Patient states that she was not told any further instructions and would like to know if any further labs are needed.

## 2024-01-06 ENCOUNTER — Other Ambulatory Visit (HOSPITAL_COMMUNITY): Payer: Self-pay

## 2024-01-06 ENCOUNTER — Other Ambulatory Visit: Payer: Self-pay | Admitting: Internal Medicine

## 2024-01-06 MED ORDER — ACYCLOVIR 200 MG PO CAPS
400.0000 mg | ORAL_CAPSULE | Freq: Three times a day (TID) | ORAL | 3 refills | Status: DC
  Filled 2024-01-06: qty 60, 10d supply, fill #0
  Filled 2024-04-18: qty 60, 10d supply, fill #1
  Filled 2024-07-14: qty 60, 10d supply, fill #2
  Filled 2024-09-20: qty 60, 10d supply, fill #3

## 2024-01-20 DIAGNOSIS — H5213 Myopia, bilateral: Secondary | ICD-10-CM | POA: Diagnosis not present

## 2024-01-20 DIAGNOSIS — H2513 Age-related nuclear cataract, bilateral: Secondary | ICD-10-CM | POA: Diagnosis not present

## 2024-01-20 DIAGNOSIS — H52203 Unspecified astigmatism, bilateral: Secondary | ICD-10-CM | POA: Diagnosis not present

## 2024-01-20 DIAGNOSIS — H25013 Cortical age-related cataract, bilateral: Secondary | ICD-10-CM | POA: Diagnosis not present

## 2024-01-28 ENCOUNTER — Other Ambulatory Visit (HOSPITAL_COMMUNITY): Payer: Self-pay

## 2024-01-28 ENCOUNTER — Ambulatory Visit
Admission: RE | Admit: 2024-01-28 | Discharge: 2024-01-28 | Disposition: A | Discharge status: Home / Self Care | Payer: Medicare PPO | Source: Ambulatory Visit | Attending: Otolaryngology | Admitting: Otolaryngology

## 2024-01-28 DIAGNOSIS — M26629 Arthralgia of temporomandibular joint, unspecified side: Secondary | ICD-10-CM

## 2024-01-28 DIAGNOSIS — K449 Diaphragmatic hernia without obstruction or gangrene: Secondary | ICD-10-CM | POA: Diagnosis not present

## 2024-01-28 DIAGNOSIS — R1314 Dysphagia, pharyngoesophageal phase: Secondary | ICD-10-CM

## 2024-01-28 DIAGNOSIS — R1310 Dysphagia, unspecified: Secondary | ICD-10-CM | POA: Diagnosis not present

## 2024-01-28 DIAGNOSIS — K219 Gastro-esophageal reflux disease without esophagitis: Secondary | ICD-10-CM | POA: Diagnosis not present

## 2024-02-02 DIAGNOSIS — S29011A Strain of muscle and tendon of front wall of thorax, initial encounter: Secondary | ICD-10-CM | POA: Diagnosis not present

## 2024-02-15 DIAGNOSIS — H25811 Combined forms of age-related cataract, right eye: Secondary | ICD-10-CM | POA: Diagnosis not present

## 2024-02-15 DIAGNOSIS — H25011 Cortical age-related cataract, right eye: Secondary | ICD-10-CM | POA: Diagnosis not present

## 2024-02-15 DIAGNOSIS — H52221 Regular astigmatism, right eye: Secondary | ICD-10-CM | POA: Diagnosis not present

## 2024-02-15 DIAGNOSIS — H2511 Age-related nuclear cataract, right eye: Secondary | ICD-10-CM | POA: Diagnosis not present

## 2024-02-15 DIAGNOSIS — Z961 Presence of intraocular lens: Secondary | ICD-10-CM | POA: Diagnosis not present

## 2024-02-17 ENCOUNTER — Telehealth: Payer: Self-pay

## 2024-02-17 DIAGNOSIS — Z1501 Genetic susceptibility to malignant neoplasm of breast: Secondary | ICD-10-CM

## 2024-02-17 DIAGNOSIS — E785 Hyperlipidemia, unspecified: Secondary | ICD-10-CM

## 2024-02-17 DIAGNOSIS — R944 Abnormal results of kidney function studies: Secondary | ICD-10-CM

## 2024-02-17 DIAGNOSIS — Z79899 Other long term (current) drug therapy: Secondary | ICD-10-CM

## 2024-02-17 DIAGNOSIS — R7301 Impaired fasting glucose: Secondary | ICD-10-CM

## 2024-02-17 DIAGNOSIS — M129 Arthropathy, unspecified: Secondary | ICD-10-CM

## 2024-02-17 DIAGNOSIS — M810 Age-related osteoporosis without current pathological fracture: Secondary | ICD-10-CM

## 2024-02-17 NOTE — Telephone Encounter (Signed)
 Copied from CRM [id number]. Topic: Clinical - Request for Lab/Test Order >> Feb 17, 2024 11:07 AM Drema Balzarine wrote: Reason for CRM: Patient has physical 07/19/24 and requested for lab orders to be put in prior to schedule lab appointment

## 2024-02-17 NOTE — Telephone Encounter (Signed)
**Note De-identified  [place] Obfuscation** Please advise 

## 2024-02-17 NOTE — Addendum Note (Signed)
 Addended byMadelin Headings on: 02/17/2024 06:37 PM   Modules accepted: Orders

## 2024-02-17 NOTE — Telephone Encounter (Signed)
 Orders placed  fo lab pre visit

## 2024-02-18 NOTE — Telephone Encounter (Signed)
 Spoke with pt scheduled for CPE Fasting labs on 07/12/24 per Dr Fabian Sharp

## 2024-03-14 DIAGNOSIS — H25012 Cortical age-related cataract, left eye: Secondary | ICD-10-CM | POA: Diagnosis not present

## 2024-03-14 DIAGNOSIS — H52222 Regular astigmatism, left eye: Secondary | ICD-10-CM | POA: Diagnosis not present

## 2024-03-14 DIAGNOSIS — Z961 Presence of intraocular lens: Secondary | ICD-10-CM | POA: Diagnosis not present

## 2024-03-14 DIAGNOSIS — H2512 Age-related nuclear cataract, left eye: Secondary | ICD-10-CM | POA: Diagnosis not present

## 2024-03-14 DIAGNOSIS — H25812 Combined forms of age-related cataract, left eye: Secondary | ICD-10-CM | POA: Diagnosis not present

## 2024-04-08 DIAGNOSIS — L821 Other seborrheic keratosis: Secondary | ICD-10-CM | POA: Diagnosis not present

## 2024-04-08 DIAGNOSIS — D225 Melanocytic nevi of trunk: Secondary | ICD-10-CM | POA: Diagnosis not present

## 2024-04-08 DIAGNOSIS — L814 Other melanin hyperpigmentation: Secondary | ICD-10-CM | POA: Diagnosis not present

## 2024-04-08 DIAGNOSIS — D2261 Melanocytic nevi of right upper limb, including shoulder: Secondary | ICD-10-CM | POA: Diagnosis not present

## 2024-04-11 NOTE — Progress Notes (Deleted)
 [name] [address] [place] [address] Phone: [phone] Subjective:    I'm seeing this patient by the request  of:  [name], Joaquim Muir, MD  CC:   [id number]  02/03/2023 History of bursitis, do not think that any or injection may be necessary but if increasing the walking pain dancing again we will consider it at follow-up.     Healed overall noted  Patient will continue to wear good shoes, will be going to [place] in the next month.  Follow-up with me again in 3 months to make sure that no significant worsening.     Updated 04/13/2024 Nicole [name] is a 78 y.o. female coming in with complaint of finger pain L middle.       Past Medical History:  Diagnosis Date   BRCA1 positive    Breast cancer (HCC)    Left- Ductal ca SR-, Chemo (Magrinat) rad rx    GERD (gastroesophageal reflux disease)    H/O migraine    HSV-2 (herpes simplex virus 2) infection    Hypercholesteremia    Rheumatoid factor positive    OA   had rheum consult   Scoliosis    Sprain of ankle 03/06/2015   revewiwed care exercise and fu if needed     Tinnitus    Past Surgical History:  Procedure Laterality Date   BREAST LUMPECTOMY     LAPAROSCOPIC BILATERAL SALPINGO OOPHERECTOMY  2006   Dr. Denton Flakes   MASTECTOMY Bilateral 2006   Social History   Socioeconomic History   Marital status: Widowed    Spouse name: Not on file   Number of children: Not on file   Years of education: Not on file   Highest education level: Master's degree (e.g., MA, MS, MEng, MEd, MSW, MBA)  Occupational History   Not on file  Tobacco Use   Smoking status: Former   Smokeless tobacco: Never   Tobacco comments:    limited in college   Vaping Use   Vaping status: Never Used  Substance and Sexual Activity   Alcohol use: Yes    Alcohol/week: 7.0 standard drinks of alcohol    Types: 7 Glasses of wine per week   Drug use: No   Sexual activity: Not Currently    Birth  control/protection: Post-menopausal  Other Topics Concern   Not on file  Social History Narrative   Married, 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 Designated Party release signed 04/17/10.      Nicole [name]  prostate cancer  Passed  Nov 28 2020   hh of 1    Social Drivers of Corporate investment banker Strain: Low Risk  (03/10/2023)   Overall Financial Resource Strain (CARDIA)    Difficulty of Paying Living Expenses: Not hard at all  Food Insecurity: No Food Insecurity (03/10/2023)   Hunger Vital Sign    Worried About Running Out of Food in the Last Year: Never true    Ran Out of Food in the Last Year: Never true  Transportation Needs: No Transportation Needs (03/10/2023)   PRAPARE - Administrator, Civil Service (Medical): No    Lack of Transportation (Non-Medical): No  Physical Activity: Insufficiently Active (06/24/2023)   Exercise Vital Sign    Days of Exercise per Week: 3 days    Minutes of Exercise per Session: 20 min  Stress: Stress Concern Present (06/24/2023)   [employer] of Occupational Health - Occupational  Stress Questionnaire    Feeling of Stress : Rather much  Social Connections: Moderately Isolated (06/24/2023)   Social Connection and Isolation Panel [NHANES]    Frequency of Communication with Friends and Family: More than three times a week    Frequency of Social Gatherings with Friends and Family: More than three times a week    Attends Religious Services: Never    Database administrator or Organizations: Yes    Attends Engineer, structural: More than 4 times per year    Marital Status: Widowed   Allergies  Allergen Reactions   Sulfonamide Derivatives Nausea And Vomiting   Latex Rash   Family History  Problem Relation Age of Onset   Hypertension Mother    Heart disease Mother    Stroke Mother    Arthritis Mother    Heart attack Brother    Arthritis Other        maternal side RA cousin   Kidney  disease Other        maternal side   Diabetes Other        1st degree relative, paternal side   Diabetes Father    Lung cancer Father      Current Outpatient Medications (Cardiovascular):    lovastatin  (MEVACOR ) 20 MG tablet, Take 1 tablet (20 mg total) by mouth daily.  Current Outpatient Medications (Respiratory):    ipratropium (ATROVENT ) 0.03 % nasal spray, Place 2 sprays into both nostrils every 12 (twelve) hours. (Patient not taking: Reported on 10/02/2023)    Current Outpatient Medications (Other):    acyclovir  (ZOVIRAX ) 200 MG capsule, Take 2 capsules (400 mg total) by mouth 3 (three) times daily for outbreaks   acyclovir  ointment (ZOVIRAX ) 5 %, Apply 1 Application topically 3 (three) times daily as needed. (Patient not taking: Reported on 10/02/2023)   Calcium Carbonate-Vit D-Min (CALCIUM 1200 PO),    famotidine (PEPCID) 20 MG tablet, Take 20 mg by mouth as needed.   hydrOXYzine  (VISTARIL ) 25 MG capsule, Take 1 capsule (25 mg total) by mouth every 8 (eight) hours as needed (anxiety sleep.).   LORazepam  (ATIVAN ) 0.5 MG tablet, Take 1 - 2 tablets by mouth every 8 hours as needed for anxiety.   Magnesium  300 MG CAPS, Take 1 capsule (300 mg total) by mouth daily.   methocarbamol  (ROBAXIN ) 500 MG tablet, Take 1 tablet (500 mg total) by mouth every 8 (eight) hours as needed for muscle spasms. (Patient not taking: Reported on 10/02/2023)   Multiple Vitamin (MULTIVITAMIN) capsule, Take 1 capsule by mouth daily. (Patient not taking: Reported on 10/02/2023)   Multiple Vitamins-Minerals (HAIR VITAMINS PO), Take by mouth.   Reviewed prior external information including notes and imaging from  primary care provider As well as notes that were available from care everywhere and other healthcare systems.  Past medical history, social, surgical and family history all reviewed in electronic medical record.  No pertanent information unless stated regarding to the chief complaint.   Review of  Systems:  No headache, visual changes, nausea, vomiting, diarrhea, constipation, dizziness, abdominal pain, skin rash, fevers, chills, night sweats, weight loss, swollen lymph nodes, body aches, joint swelling, chest pain, shortness of breath, mood changes. POSITIVE muscle aches  Objective  Last menstrual period 11/24/1997.   General: No apparent distress alert and oriented x3 mood and affect normal, dressed appropriately.  HEENT: Pupils equal, extraocular movements intact  Respiratory: Patient's speak in full sentences and does not appear short of breath  Cardiovascular: No lower extremity  edema, non tender, no erythema      Impression and Recommendations:

## 2024-04-13 ENCOUNTER — Ambulatory Visit: Admitting: Family Medicine

## 2024-04-16 DIAGNOSIS — H10501 Unspecified blepharoconjunctivitis, right eye: Secondary | ICD-10-CM | POA: Diagnosis not present

## 2024-04-26 DIAGNOSIS — H04123 Dry eye syndrome of bilateral lacrimal glands: Secondary | ICD-10-CM | POA: Diagnosis not present

## 2024-06-28 ENCOUNTER — Other Ambulatory Visit (HOSPITAL_COMMUNITY): Payer: Self-pay

## 2024-06-28 ENCOUNTER — Ambulatory Visit: Admitting: Adult Health

## 2024-06-28 VITALS — BP 128/88 | HR 81 | Temp 98.1°F | Ht 61.0 in | Wt 119.0 lb

## 2024-06-28 DIAGNOSIS — H6993 Unspecified Eustachian tube disorder, bilateral: Secondary | ICD-10-CM | POA: Diagnosis not present

## 2024-06-28 DIAGNOSIS — J019 Acute sinusitis, unspecified: Secondary | ICD-10-CM | POA: Diagnosis not present

## 2024-06-28 MED ORDER — AZITHROMYCIN 250 MG PO TABS
ORAL_TABLET | ORAL | 0 refills | Status: AC
  Filled 2024-06-28: qty 6, 5d supply, fill #0

## 2024-06-28 NOTE — Progress Notes (Signed)
 Subjective:    Patient ID: Nicole [name], female    DOB: [birth year], 78 y.o.   MRN: [id number]  Sinusitis   78 year old female who  has a past medical history of BRCA1 positive, Breast cancer (HCC), GERD (gastroesophageal reflux disease), H/O migraine, HSV-2 (herpes simplex virus 2) infection, Hypercholesteremia, Rheumatoid factor positive, Scoliosis, Sprain of ankle (03/06/2015), and Tinnitus.  She presents to the office today for an acute issue. Her symptoms have been present for close to two weeks. Her symptoms started while she was overseas. She did end up going to the ER while in [place] and was prescribed a six day course of Augmentin  but this did not help and her symptoms continue. Symptoms include that of headache, ear pain, facial pain, nasal drainage with discolored drainage, and a productive cough.   Review of Systems See HPI   Past Medical History:  Diagnosis Date   BRCA1 positive    Breast cancer (HCC)    Left- Ductal ca SR-, Chemo (Magrinat) rad rx    GERD (gastroesophageal reflux disease)    H/O migraine    HSV-2 (herpes simplex virus 2) infection    Hypercholesteremia    Rheumatoid factor positive    OA   had rheum consult   Scoliosis    Sprain of ankle 03/06/2015   revewiwed care exercise and fu if needed     Tinnitus     Social History   Socioeconomic History   Marital status: Widowed    Spouse name: Not on file   Number of children: Not on file   Years of education: Not on file   Highest education level: Master's degree (e.g., MA, MS, MEng, MEd, MSW, MBA)  Occupational History   Not on file  Tobacco Use   Smoking status: Former   Smokeless tobacco: Never   Tobacco comments:    limited in college   Vaping Use   Vaping status: Never Used  Substance and Sexual Activity   Alcohol use: Yes    Alcohol/week: 7.0 standard drinks of alcohol    Types: 7 Glasses of wine per week   Drug use: No   Sexual activity: Not Currently    Birth  control/protection: Post-menopausal  Other Topics Concern   Not on file  Social History Narrative   Married, 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 Designated Party release signed 04/17/10.      Husband  prostate cancer  Passed  Nov 28 2020   hh of 1    Social Drivers of Corporate investment banker Strain: Low Risk  (03/10/2023)   Overall Financial Resource Strain (CARDIA)    Difficulty of Paying Living Expenses: Not hard at all  Food Insecurity: No Food Insecurity (03/10/2023)   Hunger Vital Sign    Worried About Running Out of Food in the Last Year: Never true    Ran Out of Food in the Last Year: Never true  Transportation Needs: No Transportation Needs (03/10/2023)   PRAPARE - Administrator, Civil Service (Medical): No    Lack of Transportation (Non-Medical): No  Physical Activity: Insufficiently Active (06/24/2023)   Exercise Vital Sign    Days of Exercise per Week: 3 days    Minutes of Exercise per Session: 20 min  Stress: Stress Concern Present (06/24/2023)   [employer] of Occupational Health - Occupational Stress Questionnaire    Feeling of Stress : Rather much  Social Connections: Moderately Isolated (  06/24/2023)   Social Connection and Isolation Panel    Frequency of Communication with Friends and Family: More than three times a week    Frequency of Social Gatherings with Friends and Family: More than three times a week    Attends Religious Services: Never    Database administrator or Organizations: Yes    Attends Engineer, structural: More than 4 times per year    Marital Status: Widowed  Intimate Partner Violence: Not At Risk (03/07/2022)   Humiliation, Afraid, Rape, and Kick questionnaire    Fear of Current or Ex-Partner: No    Emotionally Abused: No    Physically Abused: No    Sexually Abused: No    Past Surgical History:  Procedure Laterality Date   BREAST LUMPECTOMY     LAPAROSCOPIC BILATERAL  SALPINGO OOPHERECTOMY  2006   Dr. Mandy   MASTECTOMY Bilateral 2006    Family History  Problem Relation Age of Onset   Hypertension Mother    Heart disease Mother    Stroke Mother    Arthritis Mother    Heart attack Brother    Arthritis Other        maternal side RA cousin   Kidney disease Other        maternal side   Diabetes Other        1st degree relative, paternal side   Diabetes Father    Lung cancer Father     Allergies  Allergen Reactions   Sulfonamide Derivatives Nausea And Vomiting   Latex Rash    Current Outpatient Medications on File Prior to Visit  Medication Sig Dispense Refill   acyclovir  ointment (ZOVIRAX ) 5 % Apply 1 Application topically 3 (three) times daily as needed. 5 g 6   Calcium Carbonate-Vit D-Min (CALCIUM 1200 PO)      famotidine (PEPCID) 20 MG tablet Take 20 mg by mouth as needed.     hydrOXYzine  (VISTARIL ) 25 MG capsule Take 1 capsule (25 mg total) by mouth every 8 (eight) hours as needed (anxiety sleep.). 30 capsule 3   ipratropium (ATROVENT ) 0.03 % nasal spray Place 2 sprays into both nostrils every 12 (twelve) hours. 30 mL 1   LORazepam  (ATIVAN ) 0.5 MG tablet Take 1 - 2 tablets by mouth every 8 hours as needed for anxiety. 20 tablet 0   lovastatin  (MEVACOR ) 20 MG tablet Take 1 tablet (20 mg total) by mouth daily. 90 tablet 3   Magnesium  300 MG CAPS Take 1 capsule (300 mg total) by mouth daily. 30 capsule 3   methocarbamol  (ROBAXIN ) 500 MG tablet Take 1 tablet (500 mg total) by mouth every 8 (eight) hours as needed for muscle spasms. 30 tablet 0   Multiple Vitamin (MULTIVITAMIN) capsule Take 1 capsule by mouth daily.     Multiple Vitamins-Minerals (HAIR VITAMINS PO) Take by mouth.     acyclovir  (ZOVIRAX ) 200 MG capsule Take 2 capsules (400 mg total) by mouth 3 (three) times daily for outbreaks (Patient not taking: Reported on 06/28/2024) 60 capsule 3   No current facility-administered medications on file prior to visit.    BP 128/88   Pulse  81   Temp 98.1 F (36.7 C) (Oral)   Ht 5' 1 (1.549 m)   Wt 119 lb (54 kg)   LMP 11/24/1997 (Approximate)   SpO2 97%   BMI 22.48 kg/m       Objective:   Physical Exam Vitals and nursing note reviewed.  Constitutional:  Appearance: Normal appearance.  HENT:     Right Ear: A middle ear effusion is present. There is no impacted cerumen. Tympanic membrane is not erythematous.     Left Ear: A middle ear effusion is present. There is no impacted cerumen. Tympanic membrane is not erythematous.     Nose: Rhinorrhea present. Rhinorrhea is purulent.     Right Turbinates: Enlarged and swollen.     Left Turbinates: Enlarged and swollen.     Right Sinus: Maxillary sinus tenderness and frontal sinus tenderness present.     Left Sinus: Maxillary sinus tenderness and frontal sinus tenderness present.  Cardiovascular:     Rate and Rhythm: Normal rate and regular rhythm.     Pulses: Normal pulses.     Heart sounds: Normal heart sounds.  Pulmonary:     Effort: Pulmonary effort is normal.     Breath sounds: Normal breath sounds.  Skin:    General: Skin is warm and dry.  Neurological:     General: No focal deficit present.     Mental Status: She is alert and oriented to person, place, and time.  Psychiatric:        Mood and Affect: Mood normal.        Behavior: Behavior normal.        Thought Content: Thought content normal.        Judgment: Judgment normal.        Assessment & Plan:  1. Acute non-recurrent sinusitis, unspecified location (Primary) - Will treat with Azithromycin . She has had this in the past and it worked well for her - Follow up if not improving in the next 2-3 days  - azithromycin  (ZITHROMAX ) 250 MG tablet; Take 2 tablets on day 1, then 1 tablet daily on days 2 through 5  Dispense: 6 tablet; Refill: 0  2. Dysfunction of both eustachian tubes - Secondary to sinusitis. She can use Afrin for 3-4 days   Darleene Shape, NP

## 2024-07-12 ENCOUNTER — Other Ambulatory Visit (INDEPENDENT_AMBULATORY_CARE_PROVIDER_SITE_OTHER)

## 2024-07-12 DIAGNOSIS — R944 Abnormal results of kidney function studies: Secondary | ICD-10-CM | POA: Diagnosis not present

## 2024-07-12 DIAGNOSIS — E785 Hyperlipidemia, unspecified: Secondary | ICD-10-CM | POA: Diagnosis not present

## 2024-07-12 DIAGNOSIS — M810 Age-related osteoporosis without current pathological fracture: Secondary | ICD-10-CM

## 2024-07-12 DIAGNOSIS — M129 Arthropathy, unspecified: Secondary | ICD-10-CM | POA: Diagnosis not present

## 2024-07-12 DIAGNOSIS — Z79899 Other long term (current) drug therapy: Secondary | ICD-10-CM

## 2024-07-12 DIAGNOSIS — R7301 Impaired fasting glucose: Secondary | ICD-10-CM

## 2024-07-12 DIAGNOSIS — Z1509 Genetic susceptibility to other malignant neoplasm: Secondary | ICD-10-CM

## 2024-07-12 DIAGNOSIS — Z1501 Genetic susceptibility to malignant neoplasm of breast: Secondary | ICD-10-CM

## 2024-07-12 LAB — HEPATIC FUNCTION PANEL
ALT: 14 U/L (ref 0–35)
AST: 21 U/L (ref 0–37)
Albumin: 4.1 g/dL (ref 3.5–5.2)
Alkaline Phosphatase: 51 U/L (ref 39–117)
Bilirubin, Direct: 0.1 mg/dL (ref 0.0–0.3)
Total Bilirubin: 0.5 mg/dL (ref 0.2–1.2)
Total Protein: 6.9 g/dL (ref 6.0–8.3)

## 2024-07-12 LAB — BASIC METABOLIC PANEL WITH GFR
BUN: 15 mg/dL (ref 6–23)
CO2: 31 meq/L (ref 19–32)
Calcium: 10 mg/dL (ref 8.4–10.5)
Chloride: 99 meq/L (ref 96–112)
Creatinine, Ser: 0.9 mg/dL (ref 0.40–1.20)
GFR: 61.87 mL/min (ref >60.00)
Glucose, Bld: 91 mg/dL (ref 70–99)
Potassium: 4 meq/L (ref 3.5–5.1)
Sodium: 137 meq/L (ref 135–145)

## 2024-07-12 LAB — LIPID PANEL
Cholesterol: 196 mg/dL (ref 0–200)
HDL: 79.8 mg/dL (ref >39.00)
LDL Cholesterol: 102 mg/dL — ABNORMAL HIGH (ref 0–99)
NonHDL: 116.21
Total CHOL/HDL Ratio: 2
Triglycerides: 73 mg/dL (ref 0.0–149.0)
VLDL: 14.6 mg/dL (ref 0.0–40.0)

## 2024-07-12 LAB — CBC WITH DIFFERENTIAL/PLATELET
Basophils Absolute: 0 K/uL (ref 0.0–0.1)
Basophils Relative: 0.6 % (ref 0.0–3.0)
Eosinophils Absolute: 0.1 K/uL (ref 0.0–0.7)
Eosinophils Relative: 2.2 % (ref 0.0–5.0)
HCT: 36.9 % (ref 36.0–46.0)
Hemoglobin: 12.2 g/dL (ref 12.0–15.0)
Lymphocytes Relative: 21 % (ref 12.0–46.0)
Lymphs Abs: 0.8 K/uL (ref 0.7–4.0)
MCHC: 33 g/dL (ref 30.0–36.0)
MCV: 90.2 fl (ref 78.0–100.0)
Monocytes Absolute: 0.3 K/uL (ref 0.1–1.0)
Monocytes Relative: 8.5 % (ref 3.0–12.0)
Neutro Abs: 2.6 K/uL (ref 1.4–7.7)
Neutrophils Relative %: 67.7 % (ref 43.0–77.0)
Platelets: 247 K/uL (ref 150.0–400.0)
RBC: 4.09 Mil/uL (ref 3.87–5.11)
RDW: 14.5 % (ref 11.5–15.5)
WBC: 3.8 K/uL — ABNORMAL LOW (ref 4.0–10.5)

## 2024-07-12 LAB — MICROALBUMIN / CREATININE URINE RATIO
Creatinine,U: 77.2 mg/dL
Microalb Creat Ratio: UNDETERMINED mg/g (ref 0.0–30.0)
Microalb, Ur: <0.7 mg/dL

## 2024-07-12 LAB — HEMOGLOBIN A1C: Hgb A1c MFr Bld: 6.2 % (ref 4.6–6.5)

## 2024-07-12 LAB — TSH: TSH: 0.83 u[IU]/mL (ref 0.35–5.50)

## 2024-07-19 ENCOUNTER — Ambulatory Visit (INDEPENDENT_AMBULATORY_CARE_PROVIDER_SITE_OTHER): Admitting: Internal Medicine

## 2024-07-19 ENCOUNTER — Encounter: Payer: Self-pay | Admitting: Internal Medicine

## 2024-07-19 ENCOUNTER — Other Ambulatory Visit (HOSPITAL_COMMUNITY): Payer: Self-pay

## 2024-07-19 VITALS — BP 128/84 | HR 69 | Temp 98.2°F | Ht 61.0 in | Wt 121.4 lb

## 2024-07-19 DIAGNOSIS — Z1501 Genetic susceptibility to malignant neoplasm of breast: Secondary | ICD-10-CM | POA: Diagnosis not present

## 2024-07-19 DIAGNOSIS — Z79899 Other long term (current) drug therapy: Secondary | ICD-10-CM | POA: Diagnosis not present

## 2024-07-19 DIAGNOSIS — E785 Hyperlipidemia, unspecified: Secondary | ICD-10-CM | POA: Diagnosis not present

## 2024-07-19 DIAGNOSIS — M129 Arthropathy, unspecified: Secondary | ICD-10-CM

## 2024-07-19 DIAGNOSIS — Z0000 Encounter for general adult medical examination without abnormal findings: Secondary | ICD-10-CM

## 2024-07-19 DIAGNOSIS — Z1509 Genetic susceptibility to other malignant neoplasm: Secondary | ICD-10-CM | POA: Diagnosis not present

## 2024-07-19 NOTE — Patient Instructions (Addendum)
 See Dr Jesus    about  sinus ent issues with travel.  Crp was elevated prob from whatever illness  .  Ask pharmacy about ointment vs  cream for acyclovir   and I will order this  with explanation to pharmacy.   Current labs are ok .

## 2024-07-19 NOTE — Progress Notes (Unsigned)
 Chief Complaint  Patient presents with   Annual Exam    Discuss lab. Pt reports she was sick when at [place] trip. Had blood work and brought lab result. Would like to discuss lab. Ear still hurts.     HPI: Patient  Nicole [name]  78 y.o. comes in today for Preventive Health Care visit   HLD: mevacor   Osteoporosies by dexa  vit d  Lorazepam  use  : rare  Lips and nose  out breakds  zovirax  helps but often needs to add on topical ? Cream in past .   Doesn't want to address  problem today today  [place]    with sinus infection .  Tired   [place] flight [place] and [employer]  felt exploded  .     Roseann of blood work  augmentin  given and still  didn't go away.    But many flights that  caused issues.  Still feels bad .  Cataract  surgery x 2 this spring  Health Maintenance  Topic Date Due   DTaP/Tdap/Td (3 - Tdap) 04/14/2024   Medicare Annual Wellness (AWV)  06/08/2024   INFLUENZA VACCINE  02/21/2025 (Originally 06/24/2024)   COVID-19 Vaccine (5 - Moderna risk 2024-25 season) 12/01/2024   Pneumococcal Vaccine: 50+ Years  Completed   DEXA SCAN  Completed   Hepatitis C Screening  Completed   Zoster Vaccines- Shingrix  Completed   HPV VACCINES  Aged Out   Meningococcal B Vaccine  Aged Out   Hepatitis B Vaccines 19-59 Average Risk  Discontinued   Colonoscopy  Discontinued   Fecal DNA (Cologuard)  Discontinued   Health Maintenance Review LIFESTYLE:  Exercise:  when not ill active   Tobacco/ETS: N Alcohol:  reduced  3-4 per week  Sugar beverages:  water usually   Sleep:  5-8  Drug use: no HH of  Traveling     ROS:  GEN/ HEENT: No fever, significant weight changes sweats headaches vision problems hearing changes, CV/ PULM; No chest pain shortness of breath cough, syncope,edema  change in exercise tolerance. GI /GU: No adominal pain, vomiting, change in bowel habits. No blood in the stool. No significant GU symptoms. SKIN/HEME: ,no acute  skin rashes suspicious lesions or bleeding. No lymphadenopathy, nodules, masses.  NEURO/ PSYCH:  No neurologic signs such as weakness numbness. No depression anxiety. IMM/ Allergy: No unusual infections.  Allergy .   REST of 12 system review negative except as per HPI   Past Medical History:  Diagnosis Date   BRCA1 positive    Breast cancer (HCC)    Left- Ductal ca SR-, Chemo (Magrinat) rad rx    GERD (gastroesophageal reflux disease)    H/O migraine    HSV-2 (herpes simplex virus 2) infection    Hypercholesteremia    Rheumatoid factor positive    OA   had rheum consult   Scoliosis    Sprain of ankle 03/06/2015   revewiwed care exercise and fu if needed     Tinnitus     Past Surgical History:  Procedure Laterality Date   BREAST LUMPECTOMY     LAPAROSCOPIC BILATERAL SALPINGO OOPHERECTOMY  2006   Dr. Mandy   MASTECTOMY Bilateral 2006    Family History  Problem Relation Age of Onset   Hypertension Mother    Heart disease Mother    Stroke Mother    Arthritis Mother    Heart attack Brother    Arthritis Other  maternal side RA cousin   Kidney disease Other        maternal side   Diabetes Other        1st degree relative, paternal side   Diabetes Father    Lung cancer Father     Social History   Socioeconomic History   Marital status: Widowed    Spouse name: Not on file   Number of children: Not on file   Years of education: Not on file   Highest education level: Master's degree (e.g., MA, MS, MEng, MEd, MSW, MBA)  Occupational History   Not on file  Tobacco Use   Smoking status: Former   Smokeless tobacco: Never   Tobacco comments:    limited in college   Vaping Use   Vaping status: Never Used  Substance and Sexual Activity   Alcohol use: Yes    Alcohol/week: 7.0 standard drinks of alcohol    Types: 7 Glasses of wine per week   Drug use: No   Sexual activity: Not Currently    Birth control/protection: Post-menopausal  Other Topics Concern   Not  on file  Social History Narrative   Married, 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 Designated Party release signed 04/17/10.      Husband  prostate cancer  Passed  Nov 28 2020   hh of 1    Social Drivers of Corporate investment banker Strain: Low Risk  (03/10/2023)   Overall Financial Resource Strain (CARDIA)    Difficulty of Paying Living Expenses: Not hard at all  Food Insecurity: No Food Insecurity (03/10/2023)   Hunger Vital Sign    Worried About Running Out of Food in the Last Year: Never true    Ran Out of Food in the Last Year: Never true  Transportation Needs: No Transportation Needs (03/10/2023)   PRAPARE - Administrator, Civil Service (Medical): No    Lack of Transportation (Non-Medical): No  Physical Activity: Insufficiently Active (06/24/2023)   Exercise Vital Sign    Days of Exercise per Week: 3 days    Minutes of Exercise per Session: 20 min  Stress: Stress Concern Present (06/24/2023)   [employer] of Occupational Health - Occupational Stress Questionnaire    Feeling of Stress : Rather much  Social Connections: Moderately Isolated (06/24/2023)   Social Connection and Isolation Panel    Frequency of Communication with Friends and Family: More than three times a week    Frequency of Social Gatherings with Friends and Family: More than three times a week    Attends Religious Services: Never    Database administrator or Organizations: Yes    Attends Engineer, structural: More than 4 times per year    Marital Status: Widowed    Outpatient Medications Prior to Visit  Medication Sig Dispense Refill   acyclovir  (ZOVIRAX ) 200 MG capsule Take 2 capsules (400 mg total) by mouth 3 (three) times daily for outbreaks (Patient taking differently: Take 400 mg by mouth 3 (three) times daily. As needed) 60 capsule 3   acyclovir  ointment (ZOVIRAX ) 5 % Apply 1 Application topically 3 (three) times daily as needed. 5 g 6    Calcium Carbonate-Vit D-Min (CALCIUM 1200 PO)      famotidine (PEPCID) 20 MG tablet Take 20 mg by mouth as needed.     hydrOXYzine  (VISTARIL ) 25 MG capsule Take 1 capsule (25 mg total) by mouth every 8 (eight) hours as  needed (anxiety sleep.). 30 capsule 3   ipratropium (ATROVENT ) 0.03 % nasal spray Place 2 sprays into both nostrils every 12 (twelve) hours. 30 mL 1   LORazepam  (ATIVAN ) 0.5 MG tablet Take 1 - 2 tablets by mouth every 8 hours as needed for anxiety. 20 tablet 0   lovastatin  (MEVACOR ) 20 MG tablet Take 1 tablet (20 mg total) by mouth daily. 90 tablet 3   Magnesium  300 MG CAPS Take 1 capsule (300 mg total) by mouth daily. 30 capsule 3   methocarbamol  (ROBAXIN ) 500 MG tablet Take 1 tablet (500 mg total) by mouth every 8 (eight) hours as needed for muscle spasms. 30 tablet 0   Multiple Vitamins-Minerals (HAIR VITAMINS PO) Take by mouth.     Multiple Vitamin (MULTIVITAMIN) capsule Take 1 capsule by mouth daily.     No facility-administered medications prior to visit.     EXAM:  BP 128/84 (BP Location: Left Arm, Patient Position: Sitting, Cuff Size: Normal)   Pulse 69   Temp 98.2 F (36.8 C) (Oral)   Ht 5' 1 (1.549 m)   Wt 121 lb 6.4 oz (55.1 kg)   LMP 11/24/1997 (Approximate)   SpO2 95%   BMI 22.94 kg/m   Body mass index is 22.94 kg/m. Wt Readings from Last 3 Encounters:  07/19/24 121 lb 6.4 oz (55.1 kg)  06/28/24 119 lb (54 kg)  10/02/23 122 lb 6.4 oz (55.5 kg)    Physical Exam: Vital signs reviewed HZW:Uypd is a well-developed well-nourished alert cooperative    who appearsr stated age in no acute distress.  HEENT: normocephalic atraumatic , Eyes: PERRL EOM's full, conjunctiva clear, Nares: paten,t no deformity discharge or tenderness., Ears: no deformity EAC's clear TMs with normal landmarks. Mouth: clear OP, no lesions, edema.  Moist mucous membranes. Dentition in adequate repair. NECK: supple without masses, thyromegaly or bruits. CHEST/PULM:  Clear to  auscultation and percussion breath sounds equal no wheeze , rales or rhonchi. No chest wall deformities or tenderness. Breast: normal by inspection . No dimpling, discharge, masses, tenderness or discharge . CV: PMI is nondisplaced, S1 S2 no gallops, murmurs, rubs. Peripheral pulses are full without delay.No JVD .  ABDOMEN: Bowel sounds normal nontender  No guard or rebound, no hepato splenomegal no CVA tenderness.  No hernia. Extremtities:  No clubbing cyanosis or edema, no acute joint swelling or redness no focal atrophy NEURO:  Oriented x3, cranial nerves 3-12 appear to be intact, no obvious focal weakness,gait within normal limits no abnormal reflexes or asymmetrical SKIN: No acute rashes normal turgor, color, no bruising or petechiae. PSYCH: Oriented, good eye contact, no obvious depression anxiety, cognition and judgment appear normal. LN: no cervical axillary inguinal adenopathy  Lab Results  Component Value Date   WBC 3.8 (L) 07/12/2024   HGB 12.2 07/12/2024   HCT 36.9 07/12/2024   PLT 247.0 07/12/2024   GLUCOSE 91 07/12/2024   CHOL 196 07/12/2024   TRIG 73.0 07/12/2024   HDL 79.80 07/12/2024   LDLDIRECT 119.9 12/30/2012   LDLCALC 102 (H) 07/12/2024   ALT 14 07/12/2024   AST 21 07/12/2024   NA 137 07/12/2024   K 4.0 07/12/2024   CL 99 07/12/2024   CREATININE 0.90 07/12/2024   BUN 15 07/12/2024   CO2 31 07/12/2024   TSH 0.83 07/12/2024   HGBA1C 6.2 07/12/2024   MICROALBUR <0.7 07/12/2024    BP Readings from Last 3 Encounters:  07/19/24 128/84  06/28/24 128/88  10/02/23 136/80   Last vitamin D   Lab Results  Component Value Date   VD25OH 54.4 08/07/2023    Lab results reviewed with patient   ASSESSMENT AND PLAN:  Discussed the following assessment and plan:    ICD-10-CM   1. Visit for preventive health examination  Z00.00     2. BRCA1 positive  Z15.01    Z15.09     3. Arthropathy, multiple sites  M12.9     4. Medication management  Z79.899     5.  Osteopenia, unspecified location  M85.80      No follow-ups on file.  Patient Care Team: Kensi Karr, Apolinar POUR, MD as PCP - General Dolphus Reiter, MD (Rheumatology) Beuford Anes, MD as Consulting Physician (Orthopedic Surgery) Sebastian Lenis, MD as Consulting Physician (Orthopedic Surgery) Patrcia Sharper, MD as Consulting Physician (Ophthalmology) There are no Patient Instructions on file for this visit.  Hayes Rehfeldt K. Renlee Floor M.D.

## 2024-07-20 ENCOUNTER — Emergency Department (HOSPITAL_BASED_OUTPATIENT_CLINIC_OR_DEPARTMENT_OTHER)
Admission: EM | Admit: 2024-07-20 | Discharge: 2024-07-20 | Disposition: A | Discharge status: Home / Self Care | Attending: Emergency Medicine | Admitting: Emergency Medicine

## 2024-07-20 ENCOUNTER — Ambulatory Visit: Payer: Self-pay

## 2024-07-20 ENCOUNTER — Other Ambulatory Visit: Payer: Self-pay

## 2024-07-20 ENCOUNTER — Ambulatory Visit: Admitting: Family Medicine

## 2024-07-20 ENCOUNTER — Encounter (HOSPITAL_BASED_OUTPATIENT_CLINIC_OR_DEPARTMENT_OTHER): Payer: Self-pay

## 2024-07-20 ENCOUNTER — Emergency Department (HOSPITAL_BASED_OUTPATIENT_CLINIC_OR_DEPARTMENT_OTHER)

## 2024-07-20 ENCOUNTER — Other Ambulatory Visit (HOSPITAL_COMMUNITY): Payer: Self-pay

## 2024-07-20 DIAGNOSIS — K449 Diaphragmatic hernia without obstruction or gangrene: Secondary | ICD-10-CM | POA: Insufficient documentation

## 2024-07-20 DIAGNOSIS — Z91040 Latex allergy status: Secondary | ICD-10-CM | POA: Diagnosis not present

## 2024-07-20 DIAGNOSIS — I700 Atherosclerosis of aorta: Secondary | ICD-10-CM | POA: Insufficient documentation

## 2024-07-20 DIAGNOSIS — R109 Unspecified abdominal pain: Secondary | ICD-10-CM | POA: Insufficient documentation

## 2024-07-20 DIAGNOSIS — M5459 Other low back pain: Secondary | ICD-10-CM | POA: Diagnosis not present

## 2024-07-20 DIAGNOSIS — K769 Liver disease, unspecified: Secondary | ICD-10-CM | POA: Diagnosis not present

## 2024-07-20 DIAGNOSIS — R932 Abnormal findings on diagnostic imaging of liver and biliary tract: Secondary | ICD-10-CM | POA: Diagnosis not present

## 2024-07-20 DIAGNOSIS — M545 Low back pain, unspecified: Secondary | ICD-10-CM | POA: Diagnosis not present

## 2024-07-20 LAB — URINALYSIS, ROUTINE W REFLEX MICROSCOPIC
Bacteria, UA: NONE SEEN
Bilirubin Urine: NEGATIVE
Glucose, UA: NEGATIVE mg/dL
Hgb urine dipstick: NEGATIVE
Ketones, ur: NEGATIVE mg/dL
Leukocytes,Ua: NEGATIVE
Nitrite: NEGATIVE
Protein, ur: NEGATIVE mg/dL
Specific Gravity, Urine: 1.013 (ref 1.005–1.030)
pH: 8 (ref 5.0–8.0)

## 2024-07-20 MED ORDER — CYCLOBENZAPRINE HCL 10 MG PO TABS
10.0000 mg | ORAL_TABLET | Freq: Two times a day (BID) | ORAL | 0 refills | Status: DC | PRN
  Filled 2024-07-20: qty 20, 10d supply, fill #0

## 2024-07-20 MED ORDER — METHYLPREDNISOLONE 4 MG PO TBPK
ORAL_TABLET | ORAL | 0 refills | Status: DC
Start: 2024-07-20 — End: 2024-08-23
  Filled 2024-07-20: qty 21, 6d supply, fill #0

## 2024-07-20 NOTE — Discharge Instructions (Addendum)
 Take Medrol  Dosepak as prescribed.  Recommend 1000 mg of Tylenol every 6 hours as needed for pain.  I written you for a muscle relaxant called Flexeril  to take as needed as well.  This medication is sedating so please be careful with this use.  Follow-up with your primary care doctor or orthopedic.

## 2024-07-20 NOTE — Progress Notes (Deleted)
   LILLETTE Ileana Collet, PhD, LAT, ATC acting as a scribe for Artist Lloyd, MD.  Nicole [name] is a 78 y.o. female who presents to [facility] today for back pain. Pt was previously seen by Dr. Claudene in 2024 for R foot and R hip pain.   Today, pt c/o back pain x ***. Pt locates pain to ***  Radiating pain: LE numbness/tingling: LE weakness: Aggravates: Treatments tried:  Dx imaging: 08/30/23 L-spine XR  Pertinent review of systems: ***  Relevant historical information: ***   Exam:  LMP 11/24/1997 (Approximate)  General: Well Developed, well nourished, and in no acute distress.   MSK: ***    Lab and Radiology Results No results found for this or any previous visit (from the past 72 hours). No results found.     Assessment and Plan: 78 y.o. female with ***   PDMP not reviewed this encounter. No orders of the defined types were placed in this encounter.  No orders of the defined types were placed in this encounter.    Discussed warning signs or symptoms. Please see discharge instructions. Patient expresses understanding.   ***

## 2024-07-20 NOTE — ED Triage Notes (Signed)
 Patient reports back pain, buttock pain, and left leg pain starting yesterday. She states no injury she is aware of. She does say she danced last week. She denies urinary and bowel incontinence. Her cousin told her she might have a kidney stone.

## 2024-07-20 NOTE — Telephone Encounter (Signed)
 FYI Only or Action Required?: FYI only for provider.  Patient was last seen in primary care on 07/19/2024 by [name], Apolinar POUR, MD.  Called Nurse Triage reporting Flank Pain.  Symptoms began several days ago.  Interventions attempted: OTC medications: tylenol and ibuprofen.  Symptoms are: gradually worsening.  Triage Disposition: Go to ED Now (Notify PCP)  Patient/caregiver understands and will follow disposition?: Yes     Copied from CRM [id number]. Topic: Clinical - Red Word Triage >> Jul 20, 2024  9:12 AM Nicole [name] wrote: Red Word that prompted transfer to Nurse Triage: When she lays down flat, pain goes away eventually but when she stands up, she has severe pain in back, butt, and legs. This has ongoing for 2 days and patient thinks this may be kidney stone. Would like advice on coming to the office or going to the ED Reason for Disposition  [1] SEVERE back pain (e.g., excruciating) AND [2] sudden onset AND [3] age > 60 years  Answer Assessment - Initial Assessment Questions 1. ONSET: When did the pain begin? (e.g., minutes, hours, days)     2 days ago 2. LOCATION: Where does it hurt? (upper, mid or lower back)     Low back on left side, goes through buttocks, hips and thigh 3. SEVERITY: How bad is the pain?  (e.g., Scale 1-10; mild, moderate, or severe)     severe 4. PATTERN: Is the pain constant? (e.g., yes, no; constant, intermittent)      Comes and goes 5. RADIATION: Does the pain shoot into your legs or somewhere else?     See above 6. CAUSE:  What do you think is causing the back pain?      Kidney stone 7. BACK OVERUSE:  Any recent lifting of heavy objects, strenuous work or exercise?     denies 8. MEDICINES: What have you taken so far for the pain? (e.g., nothing, acetaminophen, NSAIDS)     Yesterday took Ibuprofen and tylenol  9. NEUROLOGIC SYMPTOMS: Do you have any weakness, numbness, or problems with bowel/bladder control?     denies 10. OTHER  SYMPTOMS: Do you have any other symptoms? (e.g., fever, abdomen pain, burning with urination, blood in urine)       Possibly a fever, recent hx of sinus infection 11. PREGNANCY: Is there any chance you are pregnant? When was your last menstrual period?       na  Protocols used: Back Pain-A-AH

## 2024-07-20 NOTE — ED Provider Notes (Signed)
 [place] EMERGENCY DEPARTMENT AT [facility] Provider Note   CSN: [id number] Arrival date & time: 07/20/24  1155     Patient presents with: Back Pain and Leg Pain   Nicole [name] is a 78 y.o. female.   Patient here with lower back pain for the last few days on and off.  Radiates down the left leg.  Some increased activity with dancing recently.  Has had some back problems in the past but usually the pain is constant and not intermittent.  Nuys any pain with urination.  No history of kidney stones.  No abdominal pain.  No nausea vomiting diarrhea.  No weakness numbness tingling.  No issues with going to the bathroom.  No urinary retention no loss of bowel.  The history is provided by the patient.       Prior to Admission medications   Medication Sig Start Date End Date Taking? Authorizing Provider  cyclobenzaprine  (FLEXERIL ) 10 MG tablet Take 1 tablet (10 mg total) by mouth 2 (two) times daily as needed for muscle spasms. 07/20/24  Yes Kateleen Encarnacion, DO  methylPREDNISolone  (MEDROL  DOSEPAK) 4 MG TBPK tablet Follow package insert 07/20/24  Yes Lucia Harm, DO  acyclovir  (ZOVIRAX ) 200 MG capsule Take 2 capsules (400 mg total) by mouth 3 (three) times daily for outbreaks Patient taking differently: Take 400 mg by mouth 3 (three) times daily. As needed 01/06/24   Webb, Padonda B, FNP  acyclovir  ointment (ZOVIRAX ) 5 % Apply 1 Application topically 3 (three) times daily as needed. 09/22/23   [name], Wanda K, MD  Calcium Carbonate-Vit D-Min (CALCIUM 1200 PO)     [provider]  famotidine (PEPCID) 20 MG tablet Take 20 mg by mouth as needed.    [provider]  hydrOXYzine  (VISTARIL ) 25 MG capsule Take 1 capsule (25 mg total) by mouth every 8 (eight) hours as needed (anxiety sleep.). 06/24/23   [name], Wanda K, MD  ipratropium (ATROVENT ) 0.03 % nasal spray Place 2 sprays into both nostrils every 12 (twelve) hours. 09/01/23     LORazepam  (ATIVAN ) 0.5 MG tablet  Take 1 - 2 tablets by mouth every 8 hours as needed for anxiety. 06/24/23   [name], Wanda K, MD  lovastatin  (MEVACOR ) 20 MG tablet Take 1 tablet (20 mg total) by mouth daily. 10/12/23   Webb, Padonda B, FNP  Magnesium  300 MG CAPS Take 1 capsule (300 mg total) by mouth daily. 03/04/23   Webb, Padonda B, FNP  methocarbamol  (ROBAXIN ) 500 MG tablet Take 1 tablet (500 mg total) by mouth every 8 (eight) hours as needed for muscle spasms. 08/30/23   Charlyn Sora, MD  Multiple Vitamins-Minerals (HAIR VITAMINS PO) Take by mouth.    [provider]    Allergies: Sulfonamide derivatives and Latex    Review of Systems  Updated Vital Signs BP 137/69 (BP Location: Right Arm)   Pulse 85   Temp 98.1 F (36.7 C) (Temporal)   Resp 18   LMP 11/24/1997 (Approximate)   SpO2 99%   Physical Exam Vitals and nursing note reviewed.  Constitutional:      General: She is not in acute distress.    Appearance: She is well-developed.  HENT:     Head: Normocephalic and atraumatic.     Nose: Nose normal.     Mouth/Throat:     Mouth: Mucous membranes are moist.  Eyes:     Extraocular Movements: Extraocular movements intact.     Conjunctiva/sclera: Conjunctivae normal.  Pupils: Pupils are equal, round, and reactive to light.  Cardiovascular:     Rate and Rhythm: Normal rate and regular rhythm.     Pulses: Normal pulses.     Heart sounds: Normal heart sounds. No murmur heard. Pulmonary:     Effort: Pulmonary effort is normal. No respiratory distress.     Breath sounds: Normal breath sounds.  Abdominal:     General: Abdomen is flat.     Palpations: Abdomen is soft.     Tenderness: There is no abdominal tenderness.  Musculoskeletal:        General: Tenderness present. No swelling.     Cervical back: Neck supple.     Comments: Tenderness to the paraspinal lumbar muscles on the left and tenderness in left gluteal muscles, left CVA tenderness  Skin:    General: Skin is warm and dry.      Capillary Refill: Capillary refill takes less than 2 seconds.  Neurological:     General: No focal deficit present.     Mental Status: She is alert and oriented to person, place, and time.     Cranial Nerves: No cranial nerve deficit.     Sensory: No sensory deficit.     Motor: No weakness.     Coordination: Coordination normal.     Comments: 5+ out of 5 strength throughout, normal sensation  Psychiatric:        Mood and Affect: Mood normal.     (all labs ordered are listed, but only abnormal results are displayed) Labs Reviewed  URINALYSIS, ROUTINE W REFLEX MICROSCOPIC - Abnormal; Notable for the following components:      Result Value   APPearance HAZY (*)    All other components within normal limits    EKG: None  Radiology: CT Renal Stone Study Result Date: 07/20/2024 CLINICAL DATA:  Abdominal pain.  Concern for kidney stone. EXAM: CT ABDOMEN AND PELVIS WITHOUT CONTRAST TECHNIQUE: Multidetector CT imaging of the abdomen and pelvis was performed following the standard protocol without IV contrast. RADIATION DOSE REDUCTION: This exam was performed according to the departmental dose-optimization program which includes automated exposure control, adjustment of the mA and/or kV according to patient size and/or use of iterative reconstruction technique. COMPARISON:  None Available. FINDINGS: Evaluation of this exam is limited in the absence of intravenous contrast. Lower chest: The visualized lung bases are clear. No intra-abdominal free air or free fluid. Hepatobiliary: There is a 2.5 cm hypodense lesion in the right lobe of the liver, likely a cyst or hemangioma. No biliary dilatation. The gallbladder is unremarkable. Pancreas: Unremarkable. No pancreatic ductal dilatation or surrounding inflammatory changes. Spleen: Normal in size without focal abnormality. Adrenals/Urinary Tract: The adrenal glands unremarkable there is no hydronephrosis or nephrolithiasis on either side. The visualized  ureters and urinary bladder appear unremarkable. Stomach/Bowel: There is a small hiatal hernia. There is no bowel obstruction or active inflammation. The appendix is normal. Vascular/Lymphatic: Mild atherosclerotic calcification of the abdominal aorta. The IVC is unremarkable. No portal venous gas. There is no adenopathy. Reproductive: The uterus is grossly unremarkable. No suspicious adnexal masses. Other: None Musculoskeletal: Degenerative changes of the spine and scoliosis. No acute osseous pathology. IMPRESSION: 1. No acute intra-abdominal or pelvic pathology. No hydronephrosis or nephrolithiasis. 2. Small hiatal hernia. 3.  Aortic Atherosclerosis (ICD10-I70.0). Electronically Signed   By: Vanetta Chou M.D.   On: 07/20/2024 13:37     Procedures   Medications Ordered in the ED - No data to display  Medical Decision Making Amount and/or Complexity of Data Reviewed Labs: ordered. Radiology: ordered.  Risk Prescription drug management.   Brieanna R Scroggs is here with left lower back pain flank pain.  Differential diagnosis likely muscular process/sciatica process but could be kidney stone UTI.  Seems less likely to be intra-abdominal process.  Not having any abdominal pain.  Seems like pain started after new activity/increased strenuous activity recently.  She is neurovascular neuromuscular intact on exam.  She has no cauda equina symptoms.  No fever.  Will get CT renal stone study, urinalysis and reevaluate.  Is not having any active pain at this time.  Urinalysis negative for infection.  CT scan renal stone study shows no evidence of kidney stone or other acute process.  I do think that this is likely muscular pain and sciatica or muscle spasms.  Will prescribe Medrol  Dosepak and Flexeril  have her follow-up with primary care.  Discharged in good condition.  Understands return precautions.  This chart was dictated using voice recognition software.   Despite best efforts to proofread,  errors can occur which can change the documentation meaning.      Final diagnoses:  Acute low back pain, unspecified back pain laterality, unspecified whether sciatica present    ED Discharge Orders          Ordered    methylPREDNISolone  (MEDROL  DOSEPAK) 4 MG TBPK tablet        07/20/24 1354    cyclobenzaprine  (FLEXERIL ) 10 MG tablet  2 times daily PRN        07/20/24 1354               Ruthe Cornet, DO 07/20/24 1357

## 2024-07-22 ENCOUNTER — Ambulatory Visit: Admitting: Sports Medicine

## 2024-07-22 VITALS — HR 104 | Ht 61.0 in | Wt 119.0 lb

## 2024-07-22 DIAGNOSIS — M4120 Other idiopathic scoliosis, site unspecified: Secondary | ICD-10-CM

## 2024-07-22 DIAGNOSIS — M51362 Other intervertebral disc degeneration, lumbar region with discogenic back pain and lower extremity pain: Secondary | ICD-10-CM | POA: Diagnosis not present

## 2024-07-22 DIAGNOSIS — G8929 Other chronic pain: Secondary | ICD-10-CM

## 2024-07-22 NOTE — Progress Notes (Signed)
 Ben Cloys Vera D.[facility] [address] [place] [address] Phone: [phone]   Assessment and Plan:     1. Chronic bilateral low back pain with left-sided sciatica (Primary) 2. Idiopathic scoliosis and kyphoscoliosis 3. Degeneration of intervertebral disc of lumbar region with discogenic back pain and lower extremity pain -Chronic with exacerbation, subsequent visit - Recurrence of low back pain with left-sided radicular symptoms into left lower extremity, hip, thigh.  Consistent with advanced scoliosis, degenerative disc disease in lumbar spine, and lumbar radiculopathy - Recommend further evaluation with lumbar MRI due to progressive worsening of symptoms, severe degenerative changes on prior x-rays and MRIs with symptoms worsening since prior x-ray and MRI, pain >6/10, pain affecting day-to-day activities - Continue prednisone course - Continue home exercises as tolerated and add additional HEP   15 additional minutes spent for educating Therapeutic Home Exercise Program.  This included exercises focusing on stretching, strengthening, with focus on eccentric aspects.   Long term goals include an improvement in range of motion, strength, endurance as well as avoiding reinjury. Patient's frequency would include in 1-2 times a day, 3-5 times a week for a duration of 6-12 weeks. Proper technique shown and discussed handout in great detail with ATC.  All questions were discussed and answered.    Pertinent previous records reviewed include lumbar MRI 2023, lumbar x-ray 08/30/23, ER note 07/20/2024   Follow Up: 5 days after MRI to review results.  Could consider epidural if appropriate based on imaging   Subjective:   I, Moenique Parris, am serving as a Neurosurgeon for Doctor Morene Mace  Chief Complaint: back and hip pain   HPI:   07/22/24 Patient is a 78 year old female with back and hip pain. Patient was seen in ED 07/20/2024 Patient here with  lower back pain for the last few days on and off. Radiates down the left leg. Some increased activity with dancing recently. Has had some back problems in the past but usually the pain is constant and not intermittent. Nuys any pain with urination. No history of kidney stones. No abdominal pain. No nausea vomiting diarrhea. No weakness numbness tingling. No issues with going to the bathroom. No urinary retention no loss of bowel.    Patient states today she was at a dance workshop and flared her low back pain that radiates to her hip. Pain is now intermittent . Pain is relieved when she lays down.   Relevant Historical Information: Scoliosis, GERD  Additional pertinent review of systems negative.   Current Outpatient Medications:    acyclovir  (ZOVIRAX ) 200 MG capsule, Take 2 capsules (400 mg total) by mouth 3 (three) times daily for outbreaks (Patient taking differently: Take 400 mg by mouth 3 (three) times daily. As needed), Disp: 60 capsule, Rfl: 3   acyclovir  ointment (ZOVIRAX ) 5 %, Apply 1 Application topically 3 (three) times daily as needed., Disp: 15 g, Rfl: 6   Calcium Carbonate-Vit D-Min (CALCIUM 1200 PO), , Disp: , Rfl:    cyclobenzaprine  (FLEXERIL ) 10 MG tablet, Take 1 tablet (10 mg total) by mouth 2 (two) times daily as needed for muscle spasms., Disp: 20 tablet, Rfl: 0   famotidine (PEPCID) 20 MG tablet, Take 20 mg by mouth as needed., Disp: , Rfl:    hydrOXYzine  (VISTARIL ) 25 MG capsule, Take 1 capsule (25 mg total) by mouth every 8 (eight) hours as needed (anxiety sleep.)., Disp: 30 capsule, Rfl: 3   ipratropium (ATROVENT ) 0.03 % nasal spray, Place  2 sprays into both nostrils every 12 (twelve) hours., Disp: 30 mL, Rfl: 1   LORazepam  (ATIVAN ) 0.5 MG tablet, Take 1 - 2 tablets by mouth every 8 hours as needed for anxiety., Disp: 20 tablet, Rfl: 0   lovastatin  (MEVACOR ) 20 MG tablet, Take 1 tablet (20 mg total) by mouth daily., Disp: 90 tablet, Rfl: 3   Magnesium  300 MG CAPS, Take 1  capsule (300 mg total) by mouth daily., Disp: 30 capsule, Rfl: 3   methocarbamol  (ROBAXIN ) 500 MG tablet, Take 1 tablet (500 mg total) by mouth every 8 (eight) hours as needed for muscle spasms., Disp: 30 tablet, Rfl: 0   methylPREDNISolone  (MEDROL  DOSEPAK) 4 MG TBPK tablet, Follow package insert, Disp: 21 each, Rfl: 0   Multiple Vitamins-Minerals (HAIR VITAMINS PO), Take by mouth., Disp: , Rfl:    Objective:     Vitals:   07/22/24 1016  Pulse: (!) 104  SpO2: 98%  Weight: 119 lb (54 kg)  Height: 5' 1 (1.549 m)      Body mass index is 22.48 kg/m.    Physical Exam:    Gen: Appears well, nad, nontoxic and pleasant Psych: Alert and oriented, appropriate mood and affect Neuro: sensation intact, strength is 5/5 in upper and lower extremities, muscle tone wnl Skin: no susupicious lesions or rashes  Back - Normal skin, Spine with significant thoracolumbar scoliosis alignment     tenderness to lumbar vertebral process palpation.   Bilateral lumbar paraspinous muscles are   tender and without spasm  TTP left greater trochanteric, gluteal musculature Straight leg raise positive left Trendelenberg positive left Piriformis Test negative Gait antalgic, favoring right leg   Electronically signed by:  Odis Mace D.[facility] 10:56 AM 07/22/24

## 2024-07-22 NOTE — Patient Instructions (Signed)
 MRI lumbar   Continue and complete prednisone dos pak   Low back HEP   Follow up 5 days after MRI to discuss results

## 2024-07-27 ENCOUNTER — Encounter: Payer: Self-pay | Admitting: Sports Medicine

## 2024-07-27 ENCOUNTER — Other Ambulatory Visit: Payer: Self-pay | Admitting: Sports Medicine

## 2024-07-27 DIAGNOSIS — R102 Pelvic and perineal pain: Secondary | ICD-10-CM

## 2024-07-27 NOTE — Telephone Encounter (Signed)
 Referral placed.

## 2024-07-31 ENCOUNTER — Ambulatory Visit (INDEPENDENT_AMBULATORY_CARE_PROVIDER_SITE_OTHER)

## 2024-07-31 DIAGNOSIS — M438X5 Other specified deforming dorsopathies, thoracolumbar region: Secondary | ICD-10-CM | POA: Diagnosis not present

## 2024-07-31 DIAGNOSIS — M5442 Lumbago with sciatica, left side: Secondary | ICD-10-CM | POA: Diagnosis not present

## 2024-07-31 DIAGNOSIS — M51362 Other intervertebral disc degeneration, lumbar region with discogenic back pain and lower extremity pain: Secondary | ICD-10-CM

## 2024-07-31 DIAGNOSIS — M5136 Other intervertebral disc degeneration, lumbar region with discogenic back pain only: Secondary | ICD-10-CM | POA: Diagnosis not present

## 2024-07-31 DIAGNOSIS — M4807 Spinal stenosis, lumbosacral region: Secondary | ICD-10-CM | POA: Diagnosis not present

## 2024-07-31 DIAGNOSIS — M47816 Spondylosis without myelopathy or radiculopathy, lumbar region: Secondary | ICD-10-CM | POA: Diagnosis not present

## 2024-07-31 DIAGNOSIS — M7601 Gluteal tendinitis, right hip: Secondary | ICD-10-CM | POA: Diagnosis not present

## 2024-07-31 DIAGNOSIS — M4120 Other idiopathic scoliosis, site unspecified: Secondary | ICD-10-CM

## 2024-07-31 DIAGNOSIS — M545 Low back pain, unspecified: Secondary | ICD-10-CM | POA: Diagnosis not present

## 2024-07-31 DIAGNOSIS — M7061 Trochanteric bursitis, right hip: Secondary | ICD-10-CM | POA: Diagnosis not present

## 2024-07-31 DIAGNOSIS — G8929 Other chronic pain: Secondary | ICD-10-CM

## 2024-07-31 DIAGNOSIS — R102 Pelvic and perineal pain: Secondary | ICD-10-CM

## 2024-08-01 ENCOUNTER — Other Ambulatory Visit (HOSPITAL_COMMUNITY): Payer: Self-pay

## 2024-08-01 ENCOUNTER — Ambulatory Visit: Admitting: Internal Medicine

## 2024-08-01 ENCOUNTER — Telehealth: Payer: Self-pay | Admitting: Sports Medicine

## 2024-08-01 ENCOUNTER — Other Ambulatory Visit: Payer: Self-pay

## 2024-08-01 MED ORDER — PREDNISONE 20 MG PO TABS
20.0000 mg | ORAL_TABLET | Freq: Every day | ORAL | 0 refills | Status: DC
  Filled 2024-08-01: qty 7, 7d supply, fill #0

## 2024-08-01 NOTE — Telephone Encounter (Signed)
 Patient called and said her hip has gotten worse. She did MRI and they would not let her move her body when it started aching and spasming. Last night she did not sleep.  Patient was taking acetaminophen last night. Patient is asking for something stronger than over the counter so she does not have to take four pills at one time. Patient would like someone to call her. Patient states she has muscle relaxer but she does not know if they are strong enough. Please advise.

## 2024-08-01 NOTE — Progress Notes (Unsigned)
 Darlyn Claud[facility] [address] [place] [address] Phone: [phone] Subjective:   Nicole [name] am a scribe for Dr. Claudene.   I'm seeing this patient by the request  of:  [name], Apolinar POUR, MD  CC: Acute worsening low back pain.  YEP:Dlagzrupcz  02/03/2024 Healed overall noted  Patient will continue to wear good shoes, will be going to [place] in the next month.  Follow-up with me again in 3 months to make sure that no significant worsening.     Updated 08/02/2024 Nicole [name] is a 78 y.o. female coming in with complaint of low back pain.  Patient has been seen previously.  Went to the emergency room and then saw another provider. Patient states that there isn't any shooting pain right now. Has a place in her butt that burns. Not sure if that is the labral tear that she was told she has. Low level of pain today.        Past Medical History:  Diagnosis Date   BRCA1 positive    Breast cancer (HCC)    Left- Ductal ca SR-, Chemo (Magrinat) rad rx    GERD (gastroesophageal reflux disease)    H/O migraine    HSV-2 (herpes simplex virus 2) infection    Hypercholesteremia    Rheumatoid factor positive    OA   had rheum consult   Scoliosis    Sprain of ankle 03/06/2015   revewiwed care exercise and fu if needed     Tinnitus    Past Surgical History:  Procedure Laterality Date   BREAST LUMPECTOMY     LAPAROSCOPIC BILATERAL SALPINGO OOPHERECTOMY  2006   Dr. Mandy   MASTECTOMY Bilateral 2006   Social History   Socioeconomic History   Marital status: Widowed    Spouse name: Not on file   Number of children: Not on file   Years of education: Not on file   Highest education level: Master's degree (e.g., MA, MS, MEng, MEd, MSW, MBA)  Occupational History   Not on file  Tobacco Use   Smoking status: Former   Smokeless tobacco: Never   Tobacco comments:    limited in college   Vaping Use   Vaping status: Never Used  Substance and Sexual  Activity   Alcohol use: Yes    Alcohol/week: 7.0 standard drinks of alcohol    Types: 7 Glasses of wine per week   Drug use: No   Sexual activity: Not Currently    Birth control/protection: Post-menopausal  Other Topics Concern   Not on file  Social History Narrative   Married, 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 Designated Party release signed 04/17/10.      Husband  prostate cancer  Passed  Nov 28 2020   hh of 1    Social Drivers of Corporate investment banker Strain: Low Risk  (03/10/2023)   Overall Financial Resource Strain (CARDIA)    Difficulty of Paying Living Expenses: Not hard at all  Food Insecurity: No Food Insecurity (03/10/2023)   Hunger Vital Sign    Worried About Running Out of Food in the Last Year: Never true    Ran Out of Food in the Last Year: Never true  Transportation Needs: No Transportation Needs (03/10/2023)   PRAPARE - Administrator, Civil Service (Medical): No    Lack of Transportation (Non-Medical): No  Physical Activity: Insufficiently Active (06/24/2023)   Exercise  Vital Sign    Days of Exercise per Week: 3 days    Minutes of Exercise per Session: 20 min  Stress: Stress Concern Present (06/24/2023)   [employer] of Occupational Health - Occupational Stress Questionnaire    Feeling of Stress : Rather much  Social Connections: Moderately Isolated (06/24/2023)   Social Connection and Isolation Panel    Frequency of Communication with Friends and Family: More than three times a week    Frequency of Social Gatherings with Friends and Family: More than three times a week    Attends Religious Services: Never    Database administrator or Organizations: Yes    Attends Engineer, structural: More than 4 times per year    Marital Status: Widowed   Allergies  Allergen Reactions   Sulfonamide Derivatives Nausea And Vomiting   Latex Rash   Family History  Problem Relation Age of Onset    Hypertension Mother    Heart disease Mother    Stroke Mother    Arthritis Mother    Heart attack Brother    Arthritis Other        maternal side RA cousin   Kidney disease Other        maternal side   Diabetes Other        1st degree relative, paternal side   Diabetes Father    Lung cancer Father     Current Outpatient Medications (Endocrine & Metabolic):    methylPREDNISolone  (MEDROL  DOSEPAK) 4 MG TBPK tablet, Follow package insert   predniSONE  (DELTASONE ) 20 MG tablet, Take 1 tablet (20 mg total) by mouth daily with breakfast.  Current Outpatient Medications (Cardiovascular):    lovastatin  (MEVACOR ) 20 MG tablet, Take 1 tablet (20 mg total) by mouth daily.  Current Outpatient Medications (Respiratory):    ipratropium (ATROVENT ) 0.03 % nasal spray, Place 2 sprays into both nostrils every 12 (twelve) hours.    Current Outpatient Medications (Other):    acyclovir  (ZOVIRAX ) 200 MG capsule, Take 2 capsules (400 mg total) by mouth 3 (three) times daily for outbreaks (Patient taking differently: Take 400 mg by mouth 3 (three) times daily. As needed)   acyclovir  ointment (ZOVIRAX ) 5 %, Apply 1 Application topically 3 (three) times daily as needed.   Calcium Carbonate-Vit D-Min (CALCIUM 1200 PO),    cyclobenzaprine  (FLEXERIL ) 10 MG tablet, Take 1 tablet (10 mg total) by mouth 2 (two) times daily as needed for muscle spasms.   famotidine (PEPCID) 20 MG tablet, Take 20 mg by mouth as needed.   hydrOXYzine  (VISTARIL ) 25 MG capsule, Take 1 capsule (25 mg total) by mouth every 8 (eight) hours as needed (anxiety sleep.).   LORazepam  (ATIVAN ) 0.5 MG tablet, Take 1 - 2 tablets by mouth every 8 hours as needed for anxiety.   Magnesium  300 MG CAPS, Take 1 capsule (300 mg total) by mouth daily.   methocarbamol  (ROBAXIN ) 500 MG tablet, Take 1 tablet (500 mg total) by mouth every 8 (eight) hours as needed for muscle spasms.   Multiple Vitamins-Minerals (HAIR VITAMINS PO), Take by  mouth.   Reviewed prior external information including notes and imaging from  primary care provider As well as notes that were available from care everywhere and other healthcare systems.  Past medical history, social, surgical and family history all reviewed in electronic medical record.  No pertanent information unless stated regarding to the chief complaint.   Review of Systems:  No headache, visual changes, nausea, vomiting, diarrhea, constipation, dizziness, abdominal  pain, skin rash, fevers, chills, night sweats, weight loss, swollen lymph nodes, body aches, joint swelling, chest pain, shortness of breath, mood changes. POSITIVE muscle aches  Objective  Blood pressure 122/70, pulse 82, height 5' 1 (1.549 m), weight 121 lb (54.9 kg), last menstrual period 11/24/1997, SpO2 97%.   General: No apparent distress alert and oriented x3 mood and affect normal, dressed appropriately.  HEENT: Pupils equal, extraocular movements intact  Respiratory: Patient's speak in full sentences and does not appear short of breath  Cardiovascular: No lower extremity edema, non tender, no erythema  Significant scoliosis noted.  Patient does have severe tenderness to palpation over the greater trochanteric area.  Tender to palpation over the L4-L5 area.  Worsening pain with extension of the back noted.  Neurovascularly intact distally    After verbal consent patient was prepped with alcohol swab and with a 21-gauge 2 inch needle injected into the left greater trochanteric area with 2 cc of 0.5% Marcaine and 1 cc of Kenalog  40 mg/mL.  No blood loss.  Band-Aid placed.  Postinjection instructions given    Impression and Recommendations:    The above documentation has been reviewed and is accurate and complete Warren Kugelman M Duward Allbritton, DO

## 2024-08-01 NOTE — Telephone Encounter (Signed)
 Patient decided to do prednisone . Advised to not use NSAIDs while in prednisone .   Asked about muscle relaxers. Patient has zanaflex 2mg  to use at night. Advised to note take other mm relaxers with zanaflex.   Patient voices understanding.

## 2024-08-02 ENCOUNTER — Ambulatory Visit: Payer: Self-pay | Admitting: Sports Medicine

## 2024-08-02 ENCOUNTER — Encounter: Payer: Self-pay | Admitting: Family Medicine

## 2024-08-02 ENCOUNTER — Ambulatory Visit: Admitting: Family Medicine

## 2024-08-02 VITALS — BP 122/70 | HR 82 | Ht 61.0 in | Wt 121.0 lb

## 2024-08-02 DIAGNOSIS — G8929 Other chronic pain: Secondary | ICD-10-CM | POA: Diagnosis not present

## 2024-08-02 DIAGNOSIS — M5442 Lumbago with sciatica, left side: Secondary | ICD-10-CM

## 2024-08-02 DIAGNOSIS — M5416 Radiculopathy, lumbar region: Secondary | ICD-10-CM | POA: Diagnosis not present

## 2024-08-02 DIAGNOSIS — M7062 Trochanteric bursitis, left hip: Secondary | ICD-10-CM | POA: Diagnosis not present

## 2024-08-02 NOTE — Assessment & Plan Note (Signed)
 Worsening symptoms with patient scoliosis.  Multiple foraminal narrowings causing nerve root impingement as well as some mild spinal stenosis.  Discussed with patient about different treatment options.  At this point I do feel this could be a contributing and epidural would be beneficial.  Was having some greater trochanteric bursitis and given an injection in that area and we will see how she responds.  Hold on any other type of medication changes at the moment.  Has prednisone  for her trip though 20 mg to take daily for 5 days.  Follow-up with me again 6 weeks after the epidural

## 2024-08-02 NOTE — Patient Instructions (Signed)
 Good to see you. Injected GT today.  Epidural L4- L5. Avoid repetitive extension. See me again in 2 months.

## 2024-08-02 NOTE — Assessment & Plan Note (Signed)
 Given injection today and tolerated the procedure well, discussed icing regimen and home exercises, discussed icing regimen.  Follow-up again in 6 to 8 weeks do believe that in the differential and more likely the lumbar radiculopathy from patient's degenerative disc disease and scoliosis is likely contributing.

## 2024-08-08 ENCOUNTER — Ambulatory Visit: Payer: Medicare PPO | Admitting: Internal Medicine

## 2024-08-11 ENCOUNTER — Ambulatory Visit: Payer: Medicare PPO | Admitting: Internal Medicine

## 2024-08-14 ENCOUNTER — Encounter (HOSPITAL_COMMUNITY): Payer: Self-pay

## 2024-08-14 ENCOUNTER — Other Ambulatory Visit: Payer: Self-pay

## 2024-08-14 ENCOUNTER — Emergency Department (HOSPITAL_COMMUNITY)
Admission: EM | Admit: 2024-08-14 | Discharge: 2024-08-14 | Discharge status: Against Medical Advice | Attending: Emergency Medicine | Admitting: Emergency Medicine

## 2024-08-14 ENCOUNTER — Emergency Department (HOSPITAL_COMMUNITY)

## 2024-08-14 DIAGNOSIS — R0781 Pleurodynia: Secondary | ICD-10-CM | POA: Insufficient documentation

## 2024-08-14 DIAGNOSIS — I10 Essential (primary) hypertension: Secondary | ICD-10-CM | POA: Diagnosis not present

## 2024-08-14 DIAGNOSIS — M542 Cervicalgia: Secondary | ICD-10-CM | POA: Diagnosis not present

## 2024-08-14 DIAGNOSIS — R079 Chest pain, unspecified: Secondary | ICD-10-CM | POA: Diagnosis not present

## 2024-08-14 DIAGNOSIS — R6884 Jaw pain: Secondary | ICD-10-CM | POA: Insufficient documentation

## 2024-08-14 DIAGNOSIS — R000 Tachycardia, unspecified: Secondary | ICD-10-CM | POA: Diagnosis not present

## 2024-08-14 DIAGNOSIS — M79603 Pain in arm, unspecified: Secondary | ICD-10-CM | POA: Diagnosis not present

## 2024-08-14 DIAGNOSIS — R519 Headache, unspecified: Secondary | ICD-10-CM | POA: Diagnosis not present

## 2024-08-14 DIAGNOSIS — Z5321 Procedure and treatment not carried out due to patient leaving prior to being seen by health care provider: Secondary | ICD-10-CM | POA: Insufficient documentation

## 2024-08-14 DIAGNOSIS — R112 Nausea with vomiting, unspecified: Secondary | ICD-10-CM | POA: Diagnosis not present

## 2024-08-14 LAB — BASIC METABOLIC PANEL WITH GFR
Anion gap: 13 (ref 5–15)
BUN: 17 mg/dL (ref 8–23)
CO2: 27 mmol/L (ref 22–32)
Calcium: 11.4 mg/dL — ABNORMAL HIGH (ref 8.9–10.3)
Chloride: 101 mmol/L (ref 98–111)
Creatinine, Ser: 0.93 mg/dL (ref 0.44–1.00)
GFR, Estimated: >60 mL/min (ref >60)
Glucose, Bld: 126 mg/dL — ABNORMAL HIGH (ref 70–99)
Potassium: 4.5 mmol/L (ref 3.5–5.1)
Sodium: 140 mmol/L (ref 135–145)

## 2024-08-14 LAB — CBC
HCT: 38.9 % (ref 36.0–46.0)
Hemoglobin: 12.6 g/dL (ref 12.0–15.0)
MCH: 30.1 pg (ref 26.0–34.0)
MCHC: 32.4 g/dL (ref 30.0–36.0)
MCV: 92.8 fL (ref 80.0–100.0)
Platelets: 276 K/uL (ref 150–400)
RBC: 4.19 MIL/uL (ref 3.87–5.11)
RDW: 14.6 % (ref 11.5–15.5)
WBC: 6.5 K/uL (ref 4.0–10.5)
nRBC: 0 % (ref 0.0–0.2)

## 2024-08-14 LAB — TROPONIN T, HIGH SENSITIVITY: Troponin T High Sensitivity: <15 ng/L (ref 0–19)

## 2024-08-14 NOTE — ED Triage Notes (Signed)
 Pt bib Ems with headache x3 hrs. 3/10 pain reported. Denies CP, SOB. NAD. Pt A&OX4.  161/92, 97 HR, 100& RA, unremarkable EKG reported.

## 2024-08-14 NOTE — ED Triage Notes (Signed)
 Pt also reports having pain in the right part of her jaw radiating down neck with pain pressure on both side of rib cage lasting 20 minutes. Symptoms resolved at this time.

## 2024-08-17 ENCOUNTER — Other Ambulatory Visit (HOSPITAL_COMMUNITY): Payer: Self-pay

## 2024-08-17 MED ORDER — AZITHROMYCIN 250 MG PO TABS
ORAL_TABLET | ORAL | 0 refills | Status: DC
  Filled 2024-08-17: qty 6, 5d supply, fill #0

## 2024-08-23 ENCOUNTER — Ambulatory Visit: Admitting: Internal Medicine

## 2024-08-23 ENCOUNTER — Encounter: Payer: Self-pay | Admitting: Internal Medicine

## 2024-08-23 VITALS — BP 122/76 | HR 89 | Ht 61.0 in | Wt 120.0 lb

## 2024-08-23 DIAGNOSIS — M810 Age-related osteoporosis without current pathological fracture: Secondary | ICD-10-CM | POA: Diagnosis not present

## 2024-08-23 LAB — VITAMIN D 25 HYDROXY (VIT D DEFICIENCY, FRACTURES): Vit D, 25-Hydroxy: 49 ng/mL (ref 30–100)

## 2024-08-23 NOTE — Progress Notes (Signed)
 Patient ID: Nicole [name], female   DOB: 1946/06/29, 77 y.o.   MRN: [id number]  HPI  Nicole [name] is a 78 y.o.-year-old female, initially referred by Dr. Darlyn Sharps, returning for follow-up for osteoporosis (OP).  Interim history: No fractures since last visit.  However, this spring she had 2 falls after tripping on a metal wire in her yard. No dizziness, vertigo, orthostasis, poor vision.  Reviewed and addended history: Pt was dx with OP in 12/2022, previously had osteopenia.  I reviewed pt's DXA scans: Date L1-L4 T score FN T score 33% distal Radius FRAX  01/21/2023 (mobile DXA) N/a RFN: n/a LFN: -2.0 -2.7   [facility] N/a RFN: n/a LFN: -1.4 -2.2 MOF: 10.5% Hip fracture: 1.7%  10[facility]14/2016 [facility]) N/a RFN: -1.1 LFN: -1.4 -1.7 MOF: 9.5% Hip fracture: 1.7%   She feels she has good balance.  Previous fractures: - heel fracture - jumped off a stage  - foot fracture (metatarsal) September 19, 2017 - metatarsal fracture fall 2022-09-19  Previous OP treatments:  - Fosamax 09/19/2000 or September 19, 2001 - for <1 year She went to a workshop for ONJ >> stopped Fosamax due to fear of side effects.  No h/o vitamin D  deficiency. Reviewed available vit D levels: Lab Results  Component Value Date   VD25OH 54.4 08/07/2023   VD25OH 49.35 03/11/2017   VD25OH 75 04/14/2014   Pt is on: - calcium 1800 mg daily previously >> now 1200 mg calcium + vitamin D  (? Dose) - prev. on a gummy multivitamin, but came off - hair skin and nails vitamin  Patient was exercising consistently but stopped at the time of her husband's death in September 19, 2021.  Afterwards, she restarted dancing.  In 02/2023, she went to her dancing workshop in [place] for 2 weeks-danced for 6 hours a day without problems.  However, she was also walking a lot and lost weight.  Currently, she walks 3 to 4 miles a day, and also dances, is active working in her yard.  + some weight bearing exercises.   She does take high vitamin A doses -off-and-on  over the years.  FH of osteoporosis: not known.  No chronic hyper/hypocalcemia or hyperparathyroidism. No h/o kidney stones. Last calcium level was elevated, though: Lab Results  Component Value Date   PTH 23 08/07/2023   PTH Comment 08/07/2023   CALCIUM 11.4 (H) 08/14/2024   CALCIUM 10.0 07/12/2024   CALCIUM CANCELED 08/07/2023   CALCIUM 10.1 07/28/2023   CALCIUM 10.1 06/16/2022   CALCIUM 10.4 06/12/2021   CALCIUM 10.2 06/11/2020   CALCIUM 10.6 (H) 06/10/2019   CALCIUM 9.7 03/24/2018   CALCIUM 10.1 03/11/2017   No h/o thyrotoxicosis. Reviewed TSH recent levels:  Lab Results  Component Value Date   TSH 0.83 07/12/2024   TSH 0.98 07/28/2023   TSH 0.79 06/16/2022   TSH 1.17 06/11/2020   TSH 1.28 03/24/2018   No h/o CKD. Last BUN/Cr: Lab Results  Component Value Date   BUN 17 08/14/2024   CREATININE 0.93 08/14/2024   She also has a history of breast cancer, for which she had chemotherapy and radiation therapy  - September 20, 1999.  Also, hyperlipidemia, prediabetes, GERD, positive rheumatoid factor .  ROS: + see above  I reviewed pt's medications, allergies, PMH, social hx, family hx, and changes were documented in the history of present illness. Otherwise, unchanged from my initial visit note.  Past Medical History:  Diagnosis Date   BRCA1 positive    Breast cancer (HCC)  Left- Ductal ca SR-, Chemo (Magrinat) rad rx    GERD (gastroesophageal reflux disease)    H/O migraine    HSV-2 (herpes simplex virus 2) infection    Hypercholesteremia    Rheumatoid factor positive    OA   had rheum consult   Scoliosis    Sprain of ankle 03/06/2015   revewiwed care exercise and fu if needed     Tinnitus    Past Surgical History:  Procedure Laterality Date   BREAST LUMPECTOMY     LAPAROSCOPIC BILATERAL SALPINGO OOPHERECTOMY  2006   Dr. Mandy   MASTECTOMY Bilateral 2006   Social History   Socioeconomic History   Marital status: Widowed    Spouse name: Not on file   Number of  children: Not on file   Years of education: Not on file   Highest education level: Master's degree (e.g., MA, MS, MEng, MEd, MSW, MBA)  Occupational History   Not on file  Tobacco Use   Smoking status: Former   Smokeless tobacco: Never   Tobacco comments:    limited in college   Vaping Use   Vaping status: Never Used  Substance and Sexual Activity   Alcohol use: Yes    Alcohol/week: 7.0 standard drinks of alcohol    Types: 7 Glasses of wine per week   Drug use: No   Sexual activity: Not Currently    Birth control/protection: Post-menopausal  Other Topics Concern   Not on file  Social History Narrative   Married, 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 Designated Party release signed 04/17/10.      Husband  prostate cancer  Passed  Nov 28 2020   hh of 1    Social Drivers of Corporate investment banker Strain: Low Risk  (03/10/2023)   Overall Financial Resource Strain (CARDIA)    Difficulty of Paying Living Expenses: Not hard at all  Food Insecurity: No Food Insecurity (03/10/2023)   Hunger Vital Sign    Worried About Running Out of Food in the Last Year: Never true    Ran Out of Food in the Last Year: Never true  Transportation Needs: No Transportation Needs (03/10/2023)   PRAPARE - Administrator, Civil Service (Medical): No    Lack of Transportation (Non-Medical): No  Physical Activity: Insufficiently Active (06/24/2023)   Exercise Vital Sign    Days of Exercise per Week: 3 days    Minutes of Exercise per Session: 20 min  Stress: Stress Concern Present (06/24/2023)   [employer] of Occupational Health - Occupational Stress Questionnaire    Feeling of Stress : Rather much  Social Connections: Moderately Isolated (06/24/2023)   Social Connection and Isolation Panel    Frequency of Communication with Friends and Family: More than three times a week    Frequency of Social Gatherings with Friends and Family: More than three  times a week    Attends Religious Services: Never    Database administrator or Organizations: Yes    Attends Engineer, structural: More than 4 times per year    Marital Status: Widowed  Intimate Partner Violence: Not At Risk (03/07/2022)   Humiliation, Afraid, Rape, and Kick questionnaire    Fear of Current or Ex-Partner: No    Emotionally Abused: No    Physically Abused: No    Sexually Abused: No   Current Outpatient Medications on File Prior to Visit  Medication Sig Dispense Refill  acyclovir  (ZOVIRAX ) 200 MG capsule Take 2 capsules (400 mg total) by mouth 3 (three) times daily for outbreaks (Patient taking differently: Take 400 mg by mouth 3 (three) times daily. As needed) 60 capsule 3   acyclovir  ointment (ZOVIRAX ) 5 % Apply 1 Application topically 3 (three) times daily as needed. 15 g 6   azithromycin  (ZITHROMAX ) 250 MG tablet Take 2 tablets by mouth on day 1.  Then take 1 tablet by mouth daily for 4 more days. 6 each 0   Calcium Carbonate-Vit D-Min (CALCIUM 1200 PO)      cyclobenzaprine  (FLEXERIL ) 10 MG tablet Take 1 tablet (10 mg total) by mouth 2 (two) times daily as needed for muscle spasms. 20 tablet 0   famotidine (PEPCID) 20 MG tablet Take 20 mg by mouth as needed.     hydrOXYzine  (VISTARIL ) 25 MG capsule Take 1 capsule (25 mg total) by mouth every 8 (eight) hours as needed (anxiety sleep.). 30 capsule 3   ipratropium (ATROVENT ) 0.03 % nasal spray Place 2 sprays into both nostrils every 12 (twelve) hours. 30 mL 1   LORazepam  (ATIVAN ) 0.5 MG tablet Take 1 - 2 tablets by mouth every 8 hours as needed for anxiety. 20 tablet 0   lovastatin  (MEVACOR ) 20 MG tablet Take 1 tablet (20 mg total) by mouth daily. 90 tablet 3   Magnesium  300 MG CAPS Take 1 capsule (300 mg total) by mouth daily. 30 capsule 3   methocarbamol  (ROBAXIN ) 500 MG tablet Take 1 tablet (500 mg total) by mouth every 8 (eight) hours as needed for muscle spasms. 30 tablet 0   methylPREDNISolone  (MEDROL  DOSEPAK)  4 MG TBPK tablet Follow package insert 21 each 0   Multiple Vitamins-Minerals (HAIR VITAMINS PO) Take by mouth.     predniSONE  (DELTASONE ) 20 MG tablet Take 1 tablet (20 mg total) by mouth daily with breakfast. 7 tablet 0   No current facility-administered medications on file prior to visit.   Allergies  Allergen Reactions   Sulfonamide Derivatives Nausea And Vomiting   Latex Rash   Family History  Problem Relation Age of Onset   Hypertension Mother    Heart disease Mother    Stroke Mother    Arthritis Mother    Heart attack Brother    Arthritis Other        maternal side RA cousin   Kidney disease Other        maternal side   Diabetes Other        1st degree relative, paternal side   Diabetes Father    Lung cancer Father    PE: BP 122/76   Pulse 89   Ht 5' 1 (1.549 m)   Wt 120 lb (54.4 kg)   LMP 11/24/1997 (Approximate)   SpO2 97%   BMI 22.67 kg/m  Wt Readings from Last 3 Encounters:  08/23/24 120 lb (54.4 kg)  08/14/24 120 lb (54.4 kg)  08/02/24 121 lb (54.9 kg)   Constitutional: normal weight, in NAD, no kyphosis Eyes:  EOMI, no exophthalmos ENT: no neck masses, no cervical lymphadenopathy Cardiovascular: RRR, No MRG Respiratory: CTA B Musculoskeletal: no deformities Skin:no rashes Neurological: no tremor with outstretched hands  Assessment: 1. Osteoporosis  2.  Elevated calcium  Plan: 1. Osteoporosis -Likely postmenopausal/age-related, no family history of osteoporosis -Based on her previous T-scores, she is at a higher risk for fractures, but we discussed at last visit that her small bone size may underestimate her BMD.  However, due to the decrease  in bone density, this is still worrisome.  She will be due for another bone density scan in 6 months.  We discussed that [facility] anymore so we will refer her to the [facility]. -At last visit, she was getting 1200 mg of calcium from calcium/vitamin D  supplement + the  amount in the multivitamin.  I did recommend to reduce her intake to <1500 mg from diet preferentially +/- supplements.  She was taking a multivitamin but she did not know exactly how much vitamin D  was in this.  We checked her vitamin D  level and this was normal, at 54.4 at last visit.  We will recheck this today.  She tells me that she is still taking her calcium and vitamin D  supplement (the same amount as at last visit), but she is off the multivitamin.  We discussed about stopping the calcium supplements since her calcium level was elevated and only starting a multivitamin.  This will give her approximately 500 mg of calcium daily and the rest she can obtain from the diet.  However, she will need to replace the vitamin D  from the calcium-vitamin D  supplement. -She is taking a vitamin a supplement which I advised her to stop due to the risk of osteoporosis and fracture with an excessive vitamin D  amount - We discussed about fall precautions at last visit. - at last visit, I gave her the handout from [facility] Re: weight bearing exercises - advised to do this every day or at least 5/7 days.  She is doing weightbearing exercises but intermittently.  She was not able to lift anything that around the time of her cataract surgeries and she is trying to go back to doing these exercises. - Last visit, I advised her to look into the [facility] for skeletal loading.  I gave her a brochure and explained the benefits.  She did look into that but then had to have cataract surgery and did not start going to the center. -At last visit I advised her to keep a balance, alkaline diet and commended a reference book for this (Building bone vitality).  -At last visit, after discussion about different classes of medications with benefits and possible side effects (including atypical fractures and ONJ), she declined treatment due to being very worried about ONJ in the setting of a history of  chemotherapy and radiation therapy.  We discussed that this did not necessarily predispose her for ONJ. I did recommend Prolia and explained the mechanism of action and expected benefits.  Given more information about this but she did not decide to start this. -Her orthopedic doctor suggested spine steroid injections.  We discussed that these can negatively impact the BMD in her spine and possibly also in the rest of her body.  She does have significant scoliosis and does have intermittent pain in her back but does not feel that this is permanent and upon our discussion, she would like to avoid the injections -we will recheck a vitamin D  level -Will order another DXA scan at next visit - will see pt back in 1 year  2.  Elevated calcium - At last visit, a calcium and PTH levels were normal - She recently had a total calcium level of 11.4 (albumin 4.1-corrected calcium 11.3) - At today's visit, we again discussed about decreasing calcium supplements and I will also check a vitamin D  level - After reducing her calcium in the diet, I plan to have  her back for a repeat calcium and further hyperparathyroidism workup  Orders Placed This Encounter  Procedures   VITAMIN D  25 Hydroxy (Vit-D Deficiency, Fractures)   Lela Fendt, MD PhD [facility]

## 2024-08-23 NOTE — Patient Instructions (Addendum)
 Please stop vitamin A.  Stop the calcium supplement and start a multivitamin and may need a separate vitamin D , also.  You should have an endocrinology follow-up appointment in 1 year.

## 2024-08-24 ENCOUNTER — Ambulatory Visit: Payer: Self-pay | Admitting: Internal Medicine

## 2024-08-24 NOTE — Progress Notes (Unsigned)
 [name] [address] [place] [address] Phone: [phone] Subjective:   [name] am a scribe for Dr. Claudene.   I'm seeing this patient by the request  of:  [name], Apolinar POUR, MD  CC: Back and hip pain  YEP:Dlagzrupcz  08/02/2024 Given injection today and tolerated the procedure well, discussed icing regimen and home exercises, discussed icing regimen.  Follow-up again in 6 to 8 weeks do believe that in the differential and more likely the lumbar radiculopathy from patient's degenerative disc disease and scoliosis is likely contributing.     Worsening symptoms with patient scoliosis.  Multiple foraminal narrowings causing nerve root impingement as well as some mild spinal stenosis.  Discussed with patient about different treatment options.  At this point I do feel this could be a contributing and epidural would be beneficial.  Was having some greater trochanteric bursitis and given an injection in that area and we will see how she responds.  Hold on any other type of medication changes at the moment.  Has prednisone  for her trip though 20 mg to take daily for 5 days.  Follow-up with me again 6 weeks after the epidural     Updated 08/25/2024 [name] is a 78 y.o. female coming in with complaint of back and hip pain. Patient states back hurts some because she has been active today. Hip feels pretty good. Had some twinges lately.        Past Medical History:  Diagnosis Date   BRCA1 positive    Breast cancer (HCC)    Left- Ductal ca SR-, Chemo (Magrinat) rad rx    GERD (gastroesophageal reflux disease)    H/O migraine    HSV-2 (herpes simplex virus 2) infection    Hypercholesteremia    Rheumatoid factor positive    OA   had rheum consult   Scoliosis    Sprain of ankle 03/06/2015   revewiwed care exercise and fu if needed     Tinnitus    Past Surgical History:  Procedure Laterality Date   BREAST LUMPECTOMY     LAPAROSCOPIC  BILATERAL SALPINGO OOPHERECTOMY  2006   Dr. Mandy   MASTECTOMY Bilateral 2006   Social History   Socioeconomic History   Marital status: Widowed    Spouse name: Not on file   Number of children: Not on file   Years of education: Not on file   Highest education level: Master's degree (e.g., MA, MS, MEng, MEd, MSW, MBA)  Occupational History   Not on file  Tobacco Use   Smoking status: Former   Smokeless tobacco: Never   Tobacco comments:    limited in college   Vaping Use   Vaping status: Never Used  Substance and Sexual Activity   Alcohol use: Yes    Alcohol/week: 7.0 standard drinks of alcohol    Types: 7 Glasses of wine per week   Drug use: No   Sexual activity: Not Currently    Birth control/protection: Post-menopausal  Other Topics Concern   Not on file  Social History Narrative   Married, 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 Designated Party release signed 04/17/10.      Husband  prostate cancer  Passed  Nov 28 2020   hh of 1    Social Drivers of Health   Financial Resource Strain: Low Risk  (03/10/2023)   Overall Financial Resource Strain (CARDIA)    Difficulty of  Paying Living Expenses: Not hard at all  Food Insecurity: No Food Insecurity (03/10/2023)   Hunger Vital Sign    Worried About Running Out of Food in the Last Year: Never true    Ran Out of Food in the Last Year: Never true  Transportation Needs: No Transportation Needs (03/10/2023)   PRAPARE - Administrator, Civil Service (Medical): No    Lack of Transportation (Non-Medical): No  Physical Activity: Insufficiently Active (06/24/2023)   Exercise Vital Sign    Days of Exercise per Week: 3 days    Minutes of Exercise per Session: 20 min  Stress: Stress Concern Present (06/24/2023)   [employer] of Occupational Health - Occupational Stress Questionnaire    Feeling of Stress : Rather much  Social Connections: Moderately Isolated (06/24/2023)   Social  Connection and Isolation Panel    Frequency of Communication with Friends and Family: More than three times a week    Frequency of Social Gatherings with Friends and Family: More than three times a week    Attends Religious Services: Never    Database administrator or Organizations: Yes    Attends Engineer, structural: More than 4 times per year    Marital Status: Widowed   Allergies  Allergen Reactions   Sulfonamide Derivatives Nausea And Vomiting   Latex Rash   Family History  Problem Relation Age of Onset   Hypertension Mother    Heart disease Mother    Stroke Mother    Arthritis Mother    Heart attack Brother    Arthritis Other        maternal side RA cousin   Kidney disease Other        maternal side   Diabetes Other        1st degree relative, paternal side   Diabetes Father    Lung cancer Father      Current Outpatient Medications (Cardiovascular):    lovastatin  (MEVACOR ) 20 MG tablet, Take 1 tablet (20 mg total) by mouth daily.  Current Outpatient Medications (Respiratory):    ipratropium (ATROVENT ) 0.03 % nasal spray, Place 2 sprays into both nostrils every 12 (twelve) hours.  Current Outpatient Medications (Analgesics):    meloxicam (MOBIC) 15 MG tablet, Take 1 tablet (15 mg total) by mouth daily.   Current Outpatient Medications (Other):    tiZANidine (ZANAFLEX) 4 MG tablet, Take 1 tablet (4 mg total) by mouth at bedtime.   acyclovir  (ZOVIRAX ) 200 MG capsule, Take 2 capsules (400 mg total) by mouth 3 (three) times daily for outbreaks (Patient taking differently: Take 400 mg by mouth 3 (three) times daily. As needed)   acyclovir  ointment (ZOVIRAX ) 5 %, Apply 1 Application topically 3 (three) times daily as needed.   Calcium Carbonate-Vit D-Min (CALCIUM 1200 PO),    cyclobenzaprine  (FLEXERIL ) 10 MG tablet, Take 1 tablet (10 mg total) by mouth 2 (two) times daily as needed for muscle spasms. (Patient not taking: Reported on 08/23/2024)   famotidine  (PEPCID) 20 MG tablet, Take 20 mg by mouth as needed.   Magnesium  300 MG CAPS, Take 1 capsule (300 mg total) by mouth daily.   methocarbamol  (ROBAXIN ) 500 MG tablet, Take 1 tablet (500 mg total) by mouth every 8 (eight) hours as needed for muscle spasms.   Multiple Vitamins-Minerals (HAIR VITAMINS PO), Take by mouth.   Reviewed prior external information including notes and imaging from  primary care provider As well as notes that were available from care  everywhere and other healthcare systems.  Past medical history, social, surgical and family history all reviewed in electronic medical record.  No pertanent information unless stated regarding to the chief complaint.   Review of Systems:  No headache, visual changes, nausea, vomiting, diarrhea, constipation, dizziness, abdominal pain, skin rash, fevers, chills, night sweats, weight loss, swollen lymph nodes, body aches, joint swelling, chest pain, shortness of breath, mood changes. POSITIVE muscle aches  Objective  Blood pressure 120/70, pulse 82, height 5' 1 (1.549 m), weight 121 lb 12.8 oz (55.2 kg), last menstrual period 11/24/1997, SpO2 97%.   General: No apparent distress alert and oriented x3 mood and affect normal, dressed appropriately.  HEENT: Pupils equal, extraocular movements intact  Respiratory: Patient's speak in full sentences and does not appear short of breath  Cardiovascular: No lower extremity edema, non tender, no erythema  Patient back exam does have some degenerative scoliosis noted.  Patient does have tightness with radicular symptoms on the left side of the back with a positive straight leg test noted.  Mild antalgic gait noted as well.    Impression and Recommendations:    The above documentation has been reviewed and is accurate and complete Kashmere Staffa M Janell Keeling, DO

## 2024-08-25 ENCOUNTER — Ambulatory Visit: Admitting: Family Medicine

## 2024-08-25 ENCOUNTER — Other Ambulatory Visit (HOSPITAL_COMMUNITY): Payer: Self-pay

## 2024-08-25 VITALS — BP 120/70 | HR 82 | Ht 61.0 in | Wt 121.8 lb

## 2024-08-25 DIAGNOSIS — M4120 Other idiopathic scoliosis, site unspecified: Secondary | ICD-10-CM

## 2024-08-25 DIAGNOSIS — M816 Localized osteoporosis [Lequesne]: Secondary | ICD-10-CM

## 2024-08-25 MED ORDER — MELOXICAM 15 MG PO TABS
15.0000 mg | ORAL_TABLET | Freq: Every day | ORAL | 0 refills | Status: AC
  Filled 2024-08-25: qty 30, 30d supply, fill #0

## 2024-08-25 MED ORDER — TIZANIDINE HCL 4 MG PO TABS
4.0000 mg | ORAL_TABLET | Freq: Every day | ORAL | 0 refills | Status: AC
  Filled 2024-08-25: qty 30, 30d supply, fill #0

## 2024-08-25 NOTE — Assessment & Plan Note (Signed)
 Discussed hep. Discussed which activities to do and which ones to avoid.  Increase activity noted.  RF positive as well.  Rt cin 6 weeks  I personally spent a total of 31 minutes in the care of the patient today including preparing to see the patient, getting/reviewing separately obtained history, performing a medically appropriate exam/evaluation, counseling and educating, and documenting clinical information in the EHR.

## 2024-08-25 NOTE — Assessment & Plan Note (Addendum)
 Known severe scoliosis noted.  Discussed icing regimen and home exercises, discussed which activities to do and which ones to avoid. Meloxicam 15 mg, Zanaflex 4 mg prescribed as well for breakthrough pain.  Discussed taking in 5-day burst.

## 2024-08-25 NOTE — Patient Instructions (Addendum)
 Good to see you. Keep hands in periferal vision. Avoid extension of the back. Meloxicam: 5 day burst Zanaflex 4 mg two nights in a row. See me again as needed.

## 2024-08-29 ENCOUNTER — Encounter: Payer: Self-pay | Admitting: Family Medicine

## 2024-08-29 NOTE — Discharge Instructions (Signed)

## 2024-08-31 ENCOUNTER — Inpatient Hospital Stay
Admission: RE | Admit: 2024-08-31 | Discharge: 2024-08-31 | Disposition: A | Discharge status: Home / Self Care | Source: Ambulatory Visit | Attending: Family Medicine | Admitting: Family Medicine

## 2024-09-12 ENCOUNTER — Ambulatory Visit: Payer: Self-pay

## 2024-09-12 NOTE — Telephone Encounter (Signed)
 FYI Only or Action Required?: FYI only for provider.  Patient was last seen in primary care on 07/19/2024 by [name], Apolinar POUR, MD.  Called Nurse Triage reporting Palpitations.  Symptoms began about a month ago.  Interventions attempted: Rest, hydration, or home remedies.  Symptoms are: stable.  Triage Disposition: See PCP When Office is Open (Within 3 Days)  Patient/caregiver understands and will follow disposition?: Yes  **Appt. Scheduled for 10/21; see note below**        Copied from CRM #[id number]. Topic: Clinical - Red Word Triage >> Sep 12, 2024 11:57 AM Mia F wrote: Red Word that prompted transfer to Nurse Triage: Chest discomfort and rapid heart beat. Went to ER a few week ago for all over the chest and neck going into the ear. Had an xray done but they did not find anything. Went outside the other day and she got hot and dizzy, nauseous, and felt her heart beating harder. She states is under stress. Feels a sense of nervousness in her chest , light headedness, and dizziness. Pain in neck happens when this occurs. Reason for Disposition  [1] Palpitations AND [2] no improvement after using Care Advice  Answer Assessment - Initial Assessment Questions 1. DESCRIPTION: Please describe your heart rate or heartbeat that you are having (e.g., fast/slow, regular/irregular, skipped or extra beats, palpitations)     Last night patient reports increased heart rate, now resolved   2. ONSET: When did it start? (e.g., minutes, hours, days)      Last night   3. DURATION: How long does it last (e.g., seconds, minutes, hours)     X 1 hour   4. PATTERN Does it come and go, or has it been constant since it started?  Does it get worse with exertion?   Are you feeling it now?     Patient is normal now, increased HR lasted one hour    7. RECURRENT SYMPTOM: Have you ever had this before? If Yes, ask: When was the last time? and What happened that time?   Yes, last time  was when she was admitted to the ED x 1 month ago       8. CAUSE: What do you think is causing the palpitations?     Unsure   9. CARDIAC HISTORY: Do you have any history of heart disease? (e.g., heart attack, angina, bypass surgery, angioplasty, arrhythmia)      No   10. OTHER SYMPTOMS: Do you have any other symptoms? (e.g., dizziness, chest pain, sweating, difficulty breathing)  No   Patient calling with multiple symptoms. Patient does not have any Chest Pain currently during the Call. Went to ER a month ago for the chest pain, neck pain and ear pain. Had an xray done, no significant findings. She reports dizziness, lightheadedness NO current symptoms, but at that time that is what she was experiencing. She is calling because she has a strong familial history of heart problems and wants to follow -up with PCP. Appt. Scheduled for 10/21  Protocols used: Heart Rate and Heartbeat Questions-A-AH

## 2024-09-13 ENCOUNTER — Ambulatory Visit: Attending: Internal Medicine

## 2024-09-13 ENCOUNTER — Ambulatory Visit: Admitting: Internal Medicine

## 2024-09-13 ENCOUNTER — Encounter: Payer: Self-pay | Admitting: Internal Medicine

## 2024-09-13 VITALS — BP 134/80 | HR 71 | Temp 97.6°F | Ht 61.0 in | Wt 120.4 lb

## 2024-09-13 DIAGNOSIS — Z8249 Family history of ischemic heart disease and other diseases of the circulatory system: Secondary | ICD-10-CM

## 2024-09-13 DIAGNOSIS — R002 Palpitations: Secondary | ICD-10-CM

## 2024-09-13 DIAGNOSIS — Z23 Encounter for immunization: Secondary | ICD-10-CM

## 2024-09-13 NOTE — Addendum Note (Signed)
 Addended by: Corey Caulfield on: 09/13/2024 04:25 PM   Modules accepted: Orders

## 2024-09-13 NOTE — Progress Notes (Signed)
 Chief Complaint  Patient presents with   Palpitations    Pt reports lately having palpitation, pressure on my chest. Today feel light headed. Get up feels dizzy. Last episode of recent episode was the other day. Lightheaded, nausea, sweaty. Notice it in September. Not sure if it us  due to stress. Did call ambulance one night  for chest tightness, heart racing.     HPI: Nicole [name] 78 y.o. come in for above     wonders if stress .   Not related to exercise which is only limited by her back situation  Takes ocass meloxicam and tizanidine . No syncope. A fib  mom  had a fib and cva  ROS: See pertinent positives and negatives per HPI. Hx of evaluation in around 2006  palpitations  got better when retired and had done well.  Had hx of  echo and scan when had chemo for breast cancer in past . Past Medical History:  Diagnosis Date   BRCA1 positive    Breast cancer (HCC)    Left- Ductal ca SR-, Chemo (Magrinat) rad rx    GERD (gastroesophageal reflux disease)    H/O migraine    HSV-2 (herpes simplex virus 2) infection    Hypercholesteremia    Rheumatoid factor positive    OA   had rheum consult   Scoliosis    Sprain of ankle 03/06/2015   revewiwed care exercise and fu if needed     Tinnitus     Family History  Problem Relation Age of Onset   Hypertension Mother    Heart disease Mother    Stroke Mother    Arthritis Mother    Heart attack Brother    Arthritis Other        maternal side RA cousin   Kidney disease Other        maternal side   Diabetes Other        1st degree relative, paternal side   Diabetes Father    Lung cancer Father     Social History   Socioeconomic History   Marital status: Widowed    Spouse name: Not on file   Number of children: Not on file   Years of education: Not on file   Highest education level: Master's degree (e.g., MA, MS, MEng, MEd, MSW, MBA)  Occupational History   Not on file  Tobacco Use   Smoking status: Former    Smokeless tobacco: Never   Tobacco comments:    limited in college   Vaping Use   Vaping status: Never Used  Substance and Sexual Activity   Alcohol use: Yes    Alcohol/week: 7.0 standard drinks of alcohol    Types: 7 Glasses of wine per week   Drug use: No   Sexual activity: Not Currently    Birth control/protection: Post-menopausal  Other Topics Concern   Not on file  Social History Narrative   Married, 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 Designated Party release signed 04/17/10.      Husband  prostate cancer  Passed  Nov 28 2020   hh of 1    Social Drivers of Corporate investment banker Strain: Low Risk  (03/10/2023)   Overall Financial Resource Strain (CARDIA)    Difficulty of Paying Living Expenses: Not hard at all  Food Insecurity: No Food Insecurity (03/10/2023)   Hunger Vital Sign    Worried About Running Out of Food in the  Last Year: Never true    Ran Out of Food in the Last Year: Never true  Transportation Needs: No Transportation Needs (03/10/2023)   PRAPARE - Administrator, Civil Service (Medical): No    Lack of Transportation (Non-Medical): No  Physical Activity: Insufficiently Active (06/24/2023)   Exercise Vital Sign    Days of Exercise per Week: 3 days    Minutes of Exercise per Session: 20 min  Stress: Stress Concern Present (06/24/2023)   [employer] of Occupational Health - Occupational Stress Questionnaire    Feeling of Stress : Rather much  Social Connections: Moderately Isolated (06/24/2023)   Social Connection and Isolation Panel    Frequency of Communication with Friends and Family: More than three times a week    Frequency of Social Gatherings with Friends and Family: More than three times a week    Attends Religious Services: Never    Database administrator or Organizations: Yes    Attends Engineer, structural: More than 4 times per year    Marital Status: Widowed    Outpatient  Medications Prior to Visit  Medication Sig Dispense Refill   acyclovir  (ZOVIRAX ) 200 MG capsule Take 2 capsules (400 mg total) by mouth 3 (three) times daily for outbreaks (Patient taking differently: Take 400 mg by mouth 3 (three) times daily. As needed) 60 capsule 3   famotidine (PEPCID) 20 MG tablet Take 20 mg by mouth as needed.     lovastatin  (MEVACOR ) 20 MG tablet Take 1 tablet (20 mg total) by mouth daily. 90 tablet 3   meloxicam (MOBIC) 15 MG tablet Take 1 tablet (15 mg total) by mouth daily. 30 tablet 0   methocarbamol  (ROBAXIN ) 500 MG tablet Take 1 tablet (500 mg total) by mouth every 8 (eight) hours as needed for muscle spasms. 30 tablet 0   Multiple Vitamins-Minerals (HAIR VITAMINS PO) Take by mouth.     tiZANidine (ZANAFLEX) 4 MG tablet Take 1 tablet (4 mg total) by mouth at bedtime. 30 tablet 0   acyclovir  ointment (ZOVIRAX ) 5 % Apply 1 Application topically 3 (three) times daily as needed. (Patient not taking: Reported on 09/13/2024) 15 g 6   Calcium Carbonate-Vit D-Min (CALCIUM 1200 PO)  (Patient not taking: Reported on 09/13/2024)     cyclobenzaprine  (FLEXERIL ) 10 MG tablet Take 1 tablet (10 mg total) by mouth 2 (two) times daily as needed for muscle spasms. (Patient not taking: Reported on 09/13/2024) 20 tablet 0   ipratropium (ATROVENT ) 0.03 % nasal spray Place 2 sprays into both nostrils every 12 (twelve) hours. (Patient not taking: Reported on 09/13/2024) 30 mL 1   Magnesium  300 MG CAPS Take 1 capsule (300 mg total) by mouth daily. (Patient not taking: Reported on 09/13/2024) 30 capsule 3   No facility-administered medications prior to visit.     EXAM:  BP 134/80 (BP Location: Left Arm, Patient Position: Sitting, Cuff Size: Normal)   Pulse 71   Temp 97.6 F (36.4 C) (Oral)   Ht 5' 1 (1.549 m)   Wt 120 lb 6.4 oz (54.6 kg)   LMP 11/24/1997 (Approximate)   SpO2 97%   BMI 22.75 kg/m   Body mass index is 22.75 kg/m.  GENERAL: vitals reviewed and listed above,  alert, oriented, appears well hydrated and in no acute distress HEENT: atraumatic, conjunctiva  clear, no obvious abnormalities on inspection of external nose and ears  NECK: no obvious masses on inspection palpation  LUNGS: clear to auscultation bilaterally,  no wheezes, rales or rhonchi, good air movement CV: HRRR, no clubbing cyanosis or  peripheral edema nl cap refill  MS: moves all extremities without noticeable focal  abnormality PSYCH: pleasant and cooperative, no obvious depression or anxiety Lab Results  Component Value Date   WBC 6.5 08/14/2024   HGB 12.6 08/14/2024   HCT 38.9 08/14/2024   PLT 276 08/14/2024   GLUCOSE 126 (H) 08/14/2024   CHOL 196 07/12/2024   TRIG 73.0 07/12/2024   HDL 79.80 07/12/2024   LDLDIRECT 119.9 12/30/2012   LDLCALC 102 (H) 07/12/2024   ALT 14 07/12/2024   AST 21 07/12/2024   NA 140 08/14/2024   K 4.5 08/14/2024   CL 101 08/14/2024   CREATININE 0.93 08/14/2024   BUN 17 08/14/2024   CO2 27 08/14/2024   TSH 0.83 07/12/2024   HGBA1C 6.2 07/12/2024   MICROALBUR <0.7 07/12/2024   BP Readings from Last 3 Encounters:  09/13/24 134/80  08/25/24 120/70  08/23/24 122/76  EKG NSR with r r'   no acute changes   ASSESSMENT AND PLAN:  Discussed the following assessment and plan:  Heart palpitations - Plan: ECHOCARDIOGRAM COMPLETE, LONG TERM MONITOR (3-14 DAYS), Ambulatory referral to Cardiology, CANCELED: ECHOCARDIOGRAM COMPLETE  Palpitation - Plan: EKG 12-Lead, ECHOCARDIOGRAM COMPLETE, CANCELED: ECHOCARDIOGRAM COMPLETE  Family history of atrial fibrillation - Plan: ECHOCARDIOGRAM COMPLETE, LONG TERM MONITOR (3-14 DAYS), Ambulatory referral to Cardiology, CANCELED: ECHOCARDIOGRAM COMPLETE Exam is reassuring   but advise evaluation Alos had atypical chest pressure  poss stress and will  order cardiology  consult also  -Patient advised to return or notify health care team  if  new concerns arise.  Patient Instructions  Exam is  good   and EKG ok .    But  will plan on    echo cardiogram and  maybe  zio patch   and then we can get   cardiology to see you .     Lauriana Denes K. Dominica Kent M.D.

## 2024-09-13 NOTE — Progress Notes (Unsigned)
 EP to read.

## 2024-09-13 NOTE — Patient Instructions (Addendum)
 Exam is  good   and EKG ok .   But  will plan on    echo cardiogram and  maybe  zio patch   and then we can get   cardiology to see you .

## 2024-09-13 NOTE — Telephone Encounter (Signed)
 Pt was seen today.

## 2024-09-20 ENCOUNTER — Other Ambulatory Visit (HOSPITAL_COMMUNITY): Payer: Self-pay

## 2024-09-20 ENCOUNTER — Other Ambulatory Visit: Payer: Self-pay

## 2024-09-22 DIAGNOSIS — H43812 Vitreous degeneration, left eye: Secondary | ICD-10-CM | POA: Diagnosis not present

## 2024-09-22 DIAGNOSIS — H5310 Unspecified subjective visual disturbances: Secondary | ICD-10-CM | POA: Diagnosis not present

## 2024-09-22 DIAGNOSIS — H04122 Dry eye syndrome of left lacrimal gland: Secondary | ICD-10-CM | POA: Diagnosis not present

## 2024-09-22 DIAGNOSIS — H26492 Other secondary cataract, left eye: Secondary | ICD-10-CM | POA: Diagnosis not present

## 2024-09-25 ENCOUNTER — Encounter: Payer: Self-pay | Admitting: Internal Medicine

## 2024-09-28 ENCOUNTER — Ambulatory Visit: Admitting: Family Medicine

## 2024-10-10 DIAGNOSIS — Z8249 Family history of ischemic heart disease and other diseases of the circulatory system: Secondary | ICD-10-CM | POA: Diagnosis not present

## 2024-10-10 DIAGNOSIS — R002 Palpitations: Secondary | ICD-10-CM | POA: Diagnosis not present

## 2024-11-04 DIAGNOSIS — Z8249 Family history of ischemic heart disease and other diseases of the circulatory system: Secondary | ICD-10-CM | POA: Diagnosis not present

## 2024-11-04 DIAGNOSIS — R002 Palpitations: Secondary | ICD-10-CM

## 2024-11-11 ENCOUNTER — Ambulatory Visit (HOSPITAL_COMMUNITY)
Admission: RE | Admit: 2024-11-11 | Discharge: 2024-11-11 | Disposition: A | Discharge status: Home / Self Care | Source: Ambulatory Visit | Attending: Internal Medicine | Admitting: Internal Medicine

## 2024-11-11 DIAGNOSIS — Z8249 Family history of ischemic heart disease and other diseases of the circulatory system: Secondary | ICD-10-CM | POA: Diagnosis present

## 2024-11-11 DIAGNOSIS — R002 Palpitations: Secondary | ICD-10-CM | POA: Diagnosis present

## 2024-11-11 DIAGNOSIS — I349 Nonrheumatic mitral valve disorder, unspecified: Secondary | ICD-10-CM

## 2024-11-11 LAB — ECHOCARDIOGRAM COMPLETE
Area-P 1/2: 4.8 cm2
Est EF: 55
MV M vel: 5.73 m/s
MV Peak grad: 131.3 mmHg
S' Lateral: 3.1 cm

## 2024-11-14 ENCOUNTER — Ambulatory Visit: Payer: Self-pay | Admitting: Internal Medicine

## 2024-11-14 NOTE — Progress Notes (Signed)
 Basically normal left ventricle pumping and no significant finding. Reassuring  Keep appt with cardiology in January to address the heart rhythm seen on monitor .

## 2024-11-22 NOTE — Progress Notes (Signed)
 " [name] [address] [place] [address] Phone: [phone] Subjective:   Nicole [name], am serving as a scribe for Dr. Arthea Claudene.  I'm seeing this patient by the request  of:  [name], Apolinar POUR, MD  CC: Back pain follow-up  YEP:Dlagzrupcz  08/25/2024 Known severe scoliosis noted.  Discussed icing regimen and home exercises, discussed which activities to do and which ones to avoid. Meloxicam  15 mg, Zanaflex  4 mg prescribed as well for breakthrough pain.  Discussed taking in 5-day burst.     Discussed hep. Discussed which activities to do and which ones to avoid.  Increase activity noted.  RF positive as well.  Rt cin 6 weeks  I personally spent a total of 31 minutes in the care of the patient today including preparing to see the patient, getting/reviewing separately obtained history, performing a medically appropriate exam/evaluation, counseling and educating, and documenting clinical information in the EHR.     Updated 11/29/2024 Nicole [name] is a 78 y.o. female coming in with complaint of back pain, known severe scoliosis.  Has been taking meloxicam  as well as Zanaflex  as needed. She does what she can throughout the day and rests when her back starts to hurt. Is not ready to get epidural at this time.   Also having R CMC joint pain. Painful to rest hand on desk when writing. Did fall this summer in her driveway.   Also c/o L shoulder pain for past year. Pain over top of shoulder and radiates into the middle deltoid. Pain is unexpected and is sharp and sudden. Painful to perform rowing motion.    Previous imaging of the MRI of the lumbar spine showed significant arthritic changes throughout the lumbar spine.  MRI of the pelvis did show that there was a anterior superior labral tear with a paralabral cyst right sided anterior superior labral tear also noted.  Trochanteric bursitis noted bilaterally.  Patient did have an epidural  ordered but has never gone through with it.  Past Medical History:  Diagnosis Date   BRCA1 positive    Breast cancer (HCC)    Left- Ductal ca SR-, Chemo (Magrinat) rad rx    GERD (gastroesophageal reflux disease)    H/O migraine    HSV-2 (herpes simplex virus 2) infection    Hypercholesteremia    Rheumatoid factor positive    OA   had rheum consult   Scoliosis    Sprain of ankle 03/06/2015   revewiwed care exercise and fu if needed     Tinnitus    Past Surgical History:  Procedure Laterality Date   BREAST LUMPECTOMY     LAPAROSCOPIC BILATERAL SALPINGO OOPHERECTOMY  2006   Dr. Mandy   MASTECTOMY Bilateral 2006   Social History   Socioeconomic History   Marital status: Widowed    Spouse name: Not on file   Number of children: Not on file   Years of education: Not on file   Highest education level: Master's degree (e.g., MA, MS, MEng, MEd, MSW, MBA)  Occupational History   Not on file  Tobacco Use   Smoking status: Former   Smokeless tobacco: Never   Tobacco comments:    limited in college   Vaping Use   Vaping status: Never Used  Substance and Sexual Activity   Alcohol use: Yes    Alcohol/week: 7.0 standard drinks of alcohol    Types: 7 Glasses of wine per week   Drug use: No  Sexual activity: Not Currently    Birth control/protection: Post-menopausal  Other Topics Concern   Not on file  Social History Narrative   Married, household of 2. Retired public affairs consultant. Regular exercise 30 min 5 days/week. Designated Party release signed 04/17/10.      Husband  prostate cancer  Passed  Nov 28 2020   hh of 1    Social Drivers of Health   Tobacco Use: Medium Risk (11/28/2024)   Patient History    Smoking Tobacco Use: Former    Smokeless Tobacco Use: Never    Passive Exposure: Not on file  Financial Resource Strain: Low Risk (03/10/2023)   Overall Financial Resource Strain (CARDIA)    Difficulty of Paying Living Expenses: Not hard at all  Food Insecurity: No Food  Insecurity (03/10/2023)   Hunger Vital Sign    Worried About Running Out of Food in the Last Year: Never true    Ran Out of Food in the Last Year: Never true  Transportation Needs: No Transportation Needs (03/10/2023)   PRAPARE - Administrator, Civil Service (Medical): No    Lack of Transportation (Non-Medical): No  Physical Activity: Insufficiently Active (06/24/2023)   Exercise Vital Sign    Days of Exercise per Week: 3 days    Minutes of Exercise per Session: 20 min  Stress: Stress Concern Present (06/24/2023)   [employer] of Occupational Health - Occupational Stress Questionnaire    Feeling of Stress : Rather much  Social Connections: Moderately Isolated (06/24/2023)   Social Connection and Isolation Panel    Frequency of Communication with Friends and Family: More than three times a week    Frequency of Social Gatherings with Friends and Family: More than three times a week    Attends Religious Services: Never    Database Administrator or Organizations: Yes    Attends Engineer, Structural: More than 4 times per year    Marital Status: Widowed  Depression (PHQ2-9): Low Risk (07/19/2024)   Depression (PHQ2-9)    PHQ-2 Score: 0  Alcohol Screen: Low Risk (06/24/2023)   Alcohol Screen    Last Alcohol Screening Score (AUDIT): 4  Housing: Low Risk (03/10/2023)   Housing    Last Housing Risk Score: 0  Utilities: Not At Risk (06/24/2023)   AHC Utilities    Threatened with loss of utilities: No  Health Literacy: Adequate Health Literacy (06/24/2023)   B1300 Health Literacy    Frequency of need for help with medical instructions: Never   Allergies[1] Family History  Problem Relation Age of Onset   Hypertension Mother    Heart disease Mother    Stroke Mother    Arthritis Mother    Heart attack Brother    Arthritis Other        maternal side RA cousin   Kidney disease Other        maternal side   Diabetes Other        1st degree relative, paternal side    Diabetes Father    Lung cancer Father     Current Outpatient Medications (Cardiovascular):    lovastatin  (MEVACOR ) 20 MG tablet, Take 1 tablet (20 mg total) by mouth daily.   metoprolol  tartrate (LOPRESSOR ) 100 MG tablet, Take 1 tablet (100 mg total) by mouth once. TAKE TWO HOURS PRIOR TO  SCHEDULE CARDIAC TEST  Current Outpatient Medications (Respiratory):    ipratropium (ATROVENT ) 0.03 % nasal spray, Place 2 sprays into both nostrils every 12 (twelve)  hours.  Current Outpatient Medications (Analgesics):    meloxicam  (MOBIC ) 15 MG tablet, Take 1 tablet (15 mg total) by mouth daily.  Current Outpatient Medications (Other):    acyclovir  (ZOVIRAX ) 200 MG capsule, Take 2 capsules (400 mg total) by mouth 3 (three) times daily for outbreaks   acyclovir  ointment (ZOVIRAX ) 5 %, Apply 1 Application topically 3 (three) times daily as needed.   famotidine (PEPCID) 20 MG tablet, Take 20 mg by mouth as needed.   Magnesium  300 MG CAPS, Take 1 capsule (300 mg total) by mouth daily.   Multiple Vitamins-Minerals (HAIR VITAMINS PO), Take by mouth.   tiZANidine  (ZANAFLEX ) 4 MG tablet, Take 1 tablet (4 mg total) by mouth at bedtime.   Reviewed prior external information including notes and imaging from  primary care provider As well as notes that were available from care everywhere and other healthcare systems.  Past medical history, social, surgical and family history all reviewed in electronic medical record.  No pertanent information unless stated regarding to the chief complaint.   Review of Systems:  No headache, visual changes, nausea, vomiting, diarrhea, constipation, dizziness, abdominal pain, skin rash, fevers, chills, night sweats, weight loss, swollen lymph nodes, body aches, joint swelling, chest pain, shortness of breath, mood changes. POSITIVE muscle aches  Objective  Blood pressure 134/82, pulse 83, height 5' 1 (1.549 m), weight 125 lb (56.7 kg), last menstrual period 11/24/1997, SpO2  97%.   General: No apparent distress alert and oriented x3 mood and affect normal, dressed appropriately.  HEENT: Pupils equal, extraocular movements intact  Respiratory: Patient's speak in full sentences and does not appear short of breath  Cardiovascular: No lower extremity edema, non tender, no erythema  Left shoulder has mild positive Jurgenson's test.  Patient is tender to palpation on the anterior aspect of the shoulder.  Very mild positive impingement noted.  Rotator cuff strength is intact though. Scoliosis of the lumbar spine noted. Patient's CMC joints do have some thenar eminence wasting noted right greater than left.  Good grip strength are noted.  Neurovascularly intact.  Negative Tinel's over the median nerve.  Positive grind test of the right [facility] joint    Impression and Recommendations:     The above documentation has been reviewed and is accurate and complete Arthea CHRISTELLA Sharps, DO       [1]  Allergies Allergen Reactions   Sulfonamide Derivatives Nausea And Vomiting   Latex Rash   "

## 2024-11-28 ENCOUNTER — Other Ambulatory Visit (HOSPITAL_COMMUNITY): Payer: Self-pay

## 2024-11-28 ENCOUNTER — Ambulatory Visit: Attending: Cardiology | Admitting: Cardiology

## 2024-11-28 ENCOUNTER — Encounter: Payer: Self-pay | Admitting: Cardiology

## 2024-11-28 VITALS — BP 116/67 | HR 86 | Resp 16 | Ht 61.0 in | Wt 120.0 lb

## 2024-11-28 DIAGNOSIS — R000 Tachycardia, unspecified: Secondary | ICD-10-CM

## 2024-11-28 DIAGNOSIS — R002 Palpitations: Secondary | ICD-10-CM | POA: Diagnosis not present

## 2024-11-28 DIAGNOSIS — E785 Hyperlipidemia, unspecified: Secondary | ICD-10-CM | POA: Diagnosis not present

## 2024-11-28 DIAGNOSIS — R072 Precordial pain: Secondary | ICD-10-CM

## 2024-11-28 MED ORDER — METOPROLOL TARTRATE 100 MG PO TABS
100.0000 mg | ORAL_TABLET | Freq: Once | ORAL | 0 refills | Status: AC
  Filled 2024-11-28 – 2024-12-01 (×2): qty 1, 1d supply, fill #0

## 2024-11-28 NOTE — Patient Instructions (Addendum)
 Medication Instructions:    One time dose of metoprolol   see below *If you need a refill on your cardiac medications before your next appointment, please call your pharmacy*   Lab Work: Not needed If you have labs (blood work) drawn today and your tests are completely normal, you will receive your results only by: MyChart Message (if you have MyChart) OR A paper copy in the mail If you have any lab test that is abnormal or we need to change your treatment, we will call you to review the results.   Testing/Procedures: Your physician has requested that you have coronary  CTA. Coronary computed tomography (CT)angiogram  is a special type of CT scan that uses a computer to produce multi-dimensional views of major blood vessels throughout the heart.  CT angiography, a contrast material is injected through an IV to help visualize the blood vessels  a painless test that uses an x-ray machine to take clear, detailed pictures of your heart arteries .  Please follow instruction sheet as given.    Follow-Up: At [facility], you and your health needs are our priority.  As part of our continuing mission to provide you with exceptional heart care, we have created designated Provider Care Teams.  These Care Teams include your primary Cardiologist (physician) and Advanced Practice Providers (APPs -  Physician Assistants and Nurse Practitioners) who all work together to provide you with the care you need, when you need it.     Your next appointment:   2 to 3 month(s)  The format for your next appointment:   In Person  Provider:   Alm Clay, MD   Other Instructions    Your cardiac CT will be scheduled at the below locations:   [facility] [address]   [facility] [address]  [place], [place] [address]    If scheduled at the [facility] at [address], please enter the parking lot using the [address]  entrance and use the FREE valet service at the patient drop-off area. Enter the building and check-in with registration on the main floor.   Please follow these instructions carefully (unless otherwise directed):  An IV will be required for this test and Nitroglycerin will be given.    On the Night Before the Test: Be sure to Drink plenty of water. Do not consume any caffeinated/decaffeinated beverages or chocolate 12 hours prior to your test. Do not take any antihistamines 12 hours prior to your test.    On the Day of the Test: Drink plenty of water until 1 hour prior to the test. Do not eat any food 1 hour prior to test. You may take your regular medications prior to the test.  Take metoprolol  (Lopressor ) 100 mg two hours prior to test. If you take Furosemide/Hydrochlorothiazide/Spironolactone/Chlorthalidone, please HOLD on the morning of the test. Patients who wear a continuous glucose monitor MUST remove the device prior to scanning. FEMALES- please wear underwire-free bra if available, avoid dresses & tight clothing        After the Test: Drink plenty of water. After receiving IV contrast, you may experience a mild flushed feeling. This is normal. On occasion, you may experience a mild rash up to 24 hours after the test. This is not dangerous. If this occurs, you can take Benadryl 25 mg, Zyrtec, Claritin, or Allegra and increase your fluid intake. (Patients taking Tikosyn should avoid Benadryl, and may take Zyrtec, Claritin,  or Allegra) If you experience trouble breathing, this can be serious. If it is severe call 911 IMMEDIATELY. If it is mild, please call our office.  We will call to schedule your test 2-4 weeks out understanding that some insurance companies will need an authorization prior to the service being performed.   For more information and frequently asked questions, please visit our website : [email or web address]  For non-scheduling related  questions, please contact the cardiac imaging nurse navigator should you have any questions/concerns: Cardiac Imaging Nurse Navigators Direct Office Dial: [phone]   For scheduling needs, including cancellations and rescheduling, please call Brittany, [phone].

## 2024-11-28 NOTE — Progress Notes (Signed)
 " Cardiology Office Note:  .   Date:  12/01/2024  ID:  Nicole [name], DOB [birth year], MRN [id number] PCP: Charlett Apolinar POUR, MD  [facility] Providers Cardiologist:  Alm Clay, MD     Chief Complaint  Patient presents with   Tachycardia    Monitor results showing short atrial burst   New Patient (Initial Visit)    Chest pain and palpitations    Patient Profile: .     Nicole [name] is a relatively healthy 79 y.o. female with a PMH notable for significant anxiety (exacerbated by grieving for the past 4 years following husband's death), borderline HLD and family history of A-fib who presents here for evaluation of chest pain and palpitations at the request of [name], Apolinar POUR, MD.  Nicole [name] was seen back in 2012 by Dr. Edith for evaluation of atypical chest pain and palpitations at the request of Dr. Charlett.  She had been placed on lovastatin  for elevated high C-reactive protein levels.  Palpitations were improved no further evaluation recommended for palpitations or chest pain as to have resolved.    Robinette R Owusu was recently seen by Dr. Charlett with complaints of intermittent chest discomfort and palpitations.  She was sent for an echocardiogram and a monitor, reviewed below.  Echocardiogram was essentially normal but the monitor showed occasional PVCs and 40 short atrial burst.  She is referred for cardiac evaluation.  Subjective  Discussed the use of AI scribe software for clinical note transcription with the patient, who gave verbal consent to proceed.  History of Present Illness Nicole [name] is a 79 year old female who presents with chest pain and palpitations. She was referred by Dr. Paulla for evaluation of chest pain and palpitations.  She has been experiencing chest pain and palpitations, initially attributing these symptoms to panic attacks following her husband's death four years ago. The chest pain occurs across her chest, lasting 20 to 30 minutes,  prompting her to seek emergency care. Despite the emergency visit, no significant findings were reported, and the pain was thought to be related to panic attacks.  Her family history is significant for cardiovascular issues, including a brother who had a myocardial infarction at 21 and both parents who died of cerebrovascular accidents. Her maternal side has a history of heart disease and valve issues, with many relatives having undergone surgeries or implants.  She has been on lovastatin  for several years due to her cholesterol levels, primarily because of her family history. She states her cholesterol is not very high.  She has a history of scoliosis, which she feels may impact her overall health. She has not been as physically active since her husband's passing, although she used to engage in international folk dancing, which she describes as strenuous. No discomfort during these activities.  A recent echocardiogram was performed-reviewed below is normal. A recent cardiac monitor was performed showing mostly sinus rhythm with 40 short atrial burst ranging from 16-18 beats-reviewed below..   Cardiovascular ROS: positive for - chest pain, irregular heartbeat, palpitations, rapid heart rate, and this is also associated with significant angina.  Chest pain occurs at rest not necessarily with exertion.  Not made worse with exertion. negative for - edema, orthopnea, paroxysmal nocturnal dyspnea, shortness of breath, or syncope or near syncope, although she gets lightheaded.  No TIA/amaurosis fugax.  No claudication.  ROS:  Review of Systems - Negative except symptoms noted above.    Objective     Studies Reviewed: .  EKG Interpretation Date/Time:  Monday November 28 2024 14:00:42 EST Ventricular Rate:  79 PR Interval:  140 QRS Duration:  84 QT Interval:  356 QTC Calculation: 408 R Axis:   0  Text Interpretation: Normal sinus rhythm Normal ECG When compared with ECG of 14-Aug-2024 00:59,  PREVIOUS ECG IS PRESENT Confirmed by Anner Lenis ([id number]) on 12/01/2024 8:09:41 PM    Lab Results  Component Value Date   CHOL 196 07/12/2024   HDL 79.80 07/12/2024   LDLCALC 102 (H) 07/12/2024   LDLDIRECT 119.9 12/30/2012   TRIG 73.0 07/12/2024   CHOLHDL 2 07/12/2024   Lab Results  Component Value Date   NA 140 08/14/2024   K 4.5 08/14/2024   CREATININE 0.93 08/14/2024   GFRNONAA >60 08/14/2024   GLUCOSE 126 (H) 08/14/2024    Recent Cardiac Studies: ECHO (11/11/2024): Normal LV size and function with an EF of ~55%.  No RWMA.  G1 DD.  Normal strain.  Normal RV size and function with normal RV SP and RAP.  Mild MR/TR and mild aortic valve calcification...  (Images personally reviewed)  14 d ZIO PATCH MONITOR (October-November 2025): Predominant sinus rhythm in the rate range of 55 to 160 bpm and average of 86 bpm.  Occasional isolated PVCs noted to 2.4% but otherwise rare isolated PACs (also rare PAC or PVC couplets/triplets and occasional ventricular bigeminy/trigeminy).  40 short nonsustained SVT/PAT episodes shortest 6 beats, longest 18 beats with a rate range of 71 of 203 bpm and average of 129 bpm.  (Report personally reviewed)  Risk Assessment/Calculations:           Physical Exam:   VS:  BP 116/67 (BP Location: Left Arm, Patient Position: Sitting, Cuff Size: Normal)   Pulse 86   Resp 16   Ht 5' 1 (1.549 m)   Wt 120 lb (54.4 kg)   LMP 11/24/1997   SpO2 99%   BMI 22.67 kg/m    Wt Readings from Last 3 Encounters:  11/29/24 125 lb (56.7 kg)  11/28/24 120 lb (54.4 kg)  09/13/24 120 lb 6.4 oz (54.6 kg)     GEN: Well nourished, well developed in no acute distress; healthy-appearing.  Well-groomed. NECK: No JVD; No carotid bruits CARDIAC: Normal S1, S2; RRR, no murmurs, rubs, gallops RESPIRATORY:  Clear to auscultation without rales, wheezing or rhonchi ; nonlabored, good air movement. ABDOMEN: Soft, non-tender, non-distended EXTREMITIES:  No edema; No deformity       ASSESSMENT AND PLAN: .    Heart palpitations Palpitations and chest pain evaluation Intermittent chest pain and palpitations likely stress-related. Echocardiogram normal ejection fraction, mild diastolic dysfunction, mild aortic valve calcification.  Recent monitor showed occasional PVCs and short atrial bursts.   Differential includes stress versus cardiac etiology. => Plan to assess with Coronary CTA. -Pending results Coronary CTA, would probably consider beta-blocker based on the presence of PVCs and short atrial runs.  This would also help with anxiety. - Ordered coronary CT angiography to assess for coronary artery disease and plaque burden. - Checked kidney function prior to CT scan for contrast safety. - Advised increased water intake before and after CT scan to protect kidney function.  Precordial pain Mostly chest pain at rest and with palpitations.  Monitor did show some PVCs and atrial runs. Differential includes stress versus cardiac etiology. => Plan to assess with Coronary CTA based on her family history of CAD and increased anxiety levels. Coronary CTA would provide more conclusive evidence of not only potentially ischemic CAD  but also baseline CAD levels. -Pending results Coronary CTA, would probably consider beta-blocker based on the presence of PVCs and short atrial runs.  This would also help with anxiety. - Ordered coronary CT angiography to assess for coronary artery disease and plaque burden. - Checked kidney function prior to CT scan for contrast safety. - Advised increased water intake before and after CT scan to protect kidney function.  Hyperlipidemia LDL goal <100 Managed with lovastatin  due to family history of heart disease. Cholesterol levels acceptable for age and risk profile. LDL around 100, acceptable for age and risk. - Continue lovastatin  20 mg therapy. => May need to adjust to be more aggressive depending on Coronary CTA results.    Orders Placed  This Encounter  Procedures   CT CORONARY MORPH W/CTA COR W/SCORE W/CA W/CM &/OR WO/CM    Standing Status:   Future    Expiration Date:   11/28/2025    If indicated for the ordered procedure, I authorize the administration of contrast media per Radiology protocol:   Yes    [facility]erred [facility]   EKG 12-Lead         Follow-Up: Return in about 2 months (around 01/26/2025) for To discuss test results.      Signed, Alm MICAEL Clay, MD, MS Alm Clay, M.D., M.S. Interventional Cardiologist  [facility] Pager # [phone]      "

## 2024-11-29 ENCOUNTER — Ambulatory Visit: Admitting: Family Medicine

## 2024-11-29 VITALS — BP 134/82 | HR 83 | Ht 61.0 in | Wt 125.0 lb

## 2024-11-29 DIAGNOSIS — M4120 Other idiopathic scoliosis, site unspecified: Secondary | ICD-10-CM | POA: Diagnosis not present

## 2024-11-29 DIAGNOSIS — M7522 Bicipital tendinitis, left shoulder: Secondary | ICD-10-CM | POA: Insufficient documentation

## 2024-11-29 DIAGNOSIS — M180 Bilateral primary osteoarthritis of first carpometacarpal joints: Secondary | ICD-10-CM | POA: Insufficient documentation

## 2024-11-29 NOTE — Assessment & Plan Note (Signed)
 10 this with subluxation.  Home exercises given, discussed compression sleeve.  Follow-up again in 6 to 12 weeks

## 2024-11-29 NOTE — Assessment & Plan Note (Signed)
 Known arthritic changes, multiple areas of arthritic changes.  Will continue to monitor.  Patient wants to hold on anything such as the epidural at the moment but the order is send if necessary.

## 2024-11-29 NOTE — Assessment & Plan Note (Signed)
 Arthritis noted of both CMC joints but right greater than left.  Mild atrophy of the thenar eminence noted bilaterally.  Differential includes carpal tunnel patient has no numbness, no weakness of the hands.  Worsening pain consider injection.

## 2024-11-29 NOTE — Patient Instructions (Signed)
 Compression sleeve Brace to sleep in Try Voltaren See you again in 2-3 weeks

## 2024-12-01 ENCOUNTER — Encounter: Payer: Self-pay | Admitting: Cardiology

## 2024-12-01 ENCOUNTER — Other Ambulatory Visit: Payer: Self-pay | Admitting: Family

## 2024-12-01 ENCOUNTER — Other Ambulatory Visit (HOSPITAL_COMMUNITY): Payer: Self-pay

## 2024-12-01 MED ORDER — ACYCLOVIR 200 MG PO CAPS
400.0000 mg | ORAL_CAPSULE | Freq: Three times a day (TID) | ORAL | 3 refills | Status: AC
  Filled 2024-12-01: qty 60, 10d supply, fill #0
  Filled 2024-12-12: qty 60, 10d supply, fill #1

## 2024-12-01 NOTE — Assessment & Plan Note (Signed)
 Managed with lovastatin  due to family history of heart disease. Cholesterol levels acceptable for age and risk profile. LDL around 100, acceptable for age and risk. - Continue lovastatin  20 mg therapy. => May need to adjust to be more aggressive depending on Coronary CTA results.

## 2024-12-01 NOTE — Assessment & Plan Note (Signed)
 Palpitations and chest pain evaluation Intermittent chest pain and palpitations likely stress-related. Echocardiogram normal ejection fraction, mild diastolic dysfunction, mild aortic valve calcification.  Recent monitor showed occasional PVCs and short atrial bursts.   Differential includes stress versus cardiac etiology. => Plan to assess with Coronary CTA. -Pending results Coronary CTA, would probably consider beta-blocker based on the presence of PVCs and short atrial runs.  This would also help with anxiety. - Ordered coronary CT angiography to assess for coronary artery disease and plaque burden. - Checked kidney function prior to CT scan for contrast safety. - Advised increased water intake before and after CT scan to protect kidney function.

## 2024-12-01 NOTE — Assessment & Plan Note (Addendum)
 Mostly chest pain at rest and with palpitations.  Monitor did show some PVCs and atrial runs. Differential includes stress versus cardiac etiology. => Plan to assess with Coronary CTA based on her family history of CAD and increased anxiety levels. Coronary CTA would provide more conclusive evidence of not only potentially ischemic CAD but also baseline CAD levels. -Pending results Coronary CTA, would probably consider beta-blocker based on the presence of PVCs and short atrial runs.  This would also help with anxiety. - Ordered coronary CT angiography to assess for coronary artery disease and plaque burden. - Checked kidney function prior to CT scan for contrast safety. - Advised increased water intake before and after CT scan to protect kidney function.

## 2024-12-02 ENCOUNTER — Other Ambulatory Visit (HOSPITAL_COMMUNITY): Payer: Self-pay

## 2024-12-02 ENCOUNTER — Ambulatory Visit

## 2024-12-08 ENCOUNTER — Encounter (HOSPITAL_COMMUNITY): Payer: Self-pay

## 2024-12-12 ENCOUNTER — Ambulatory Visit (HOSPITAL_COMMUNITY)
Admission: RE | Admit: 2024-12-12 | Discharge: 2024-12-12 | Disposition: A | Discharge status: Home / Self Care | Source: Ambulatory Visit | Attending: Internal Medicine | Admitting: Internal Medicine

## 2024-12-12 DIAGNOSIS — R072 Precordial pain: Secondary | ICD-10-CM | POA: Insufficient documentation

## 2024-12-12 MED ORDER — NITROGLYCERIN 0.4 MG SL SUBL
0.8000 mg | SUBLINGUAL_TABLET | Freq: Once | SUBLINGUAL | Status: AC
  Administered 2024-12-12: 0.8 mg via SUBLINGUAL

## 2024-12-12 MED ORDER — IOHEXOL 350 MG/ML SOLN
100.0000 mL | Freq: Once | INTRAVENOUS | Status: AC | PRN
  Administered 2024-12-12: 100 mL via INTRAVENOUS

## 2024-12-13 ENCOUNTER — Ambulatory Visit: Payer: Self-pay | Admitting: Cardiology

## 2024-12-21 ENCOUNTER — Encounter: Payer: Self-pay | Admitting: Cardiology

## 2024-12-28 ENCOUNTER — Other Ambulatory Visit: Payer: Self-pay

## 2024-12-28 ENCOUNTER — Other Ambulatory Visit: Payer: Self-pay | Admitting: Family

## 2024-12-28 ENCOUNTER — Other Ambulatory Visit (HOSPITAL_COMMUNITY): Payer: Self-pay

## 2024-12-28 MED ORDER — LOVASTATIN 20 MG PO TABS
20.0000 mg | ORAL_TABLET | Freq: Every day | ORAL | 3 refills | Status: AC
  Filled 2024-12-28: qty 90, 90d supply, fill #0

## 2025-02-03 ENCOUNTER — Ambulatory Visit: Admitting: Cardiology

## 2025-08-24 ENCOUNTER — Ambulatory Visit: Admitting: Internal Medicine
